# Patient Record
Sex: Female | Born: 1935 | Race: Black or African American | Hispanic: No | Smoking: Former smoker
Health system: Southern US, Community
[De-identification: ages and names within clinical notes are randomized; demographics above are authoritative.]

## PROBLEM LIST (undated history)

## (undated) DIAGNOSIS — M1990 Unspecified osteoarthritis, unspecified site: Secondary | ICD-10-CM

## (undated) DIAGNOSIS — Z7189 Other specified counseling: Secondary | ICD-10-CM

## (undated) DIAGNOSIS — E039 Hypothyroidism, unspecified: Secondary | ICD-10-CM

## (undated) DIAGNOSIS — E049 Nontoxic goiter, unspecified: Secondary | ICD-10-CM

## (undated) DIAGNOSIS — C189 Malignant neoplasm of colon, unspecified: Secondary | ICD-10-CM

## (undated) DIAGNOSIS — Z933 Colostomy status: Secondary | ICD-10-CM

## (undated) DIAGNOSIS — I10 Essential (primary) hypertension: Secondary | ICD-10-CM

## (undated) DIAGNOSIS — E065 Other chronic thyroiditis: Secondary | ICD-10-CM

## (undated) HISTORY — PX: COLONOSCOPY: SHX174

## (undated) HISTORY — PX: COLOSTOMY REVERSAL: SHX5782

## (undated) HISTORY — DX: Other specified counseling: Z71.89

## (undated) HISTORY — PX: PORTA CATH INSERTION: CATH118285

---

## 2016-07-28 HISTORY — PX: COLON SURGERY: SHX602

## 2016-07-28 HISTORY — PX: NEPHRECTOMY: SHX65

## 2016-07-28 HISTORY — PX: SPLENECTOMY: SUR1306

## 2016-09-23 ENCOUNTER — Other Ambulatory Visit: Payer: Self-pay | Admitting: Oncology

## 2016-09-26 DIAGNOSIS — C186 Malignant neoplasm of descending colon: Secondary | ICD-10-CM | POA: Insufficient documentation

## 2016-09-27 ENCOUNTER — Ambulatory Visit: Payer: Federal, State, Local not specified - PPO | Admitting: Hematology

## 2016-09-30 ENCOUNTER — Ambulatory Visit (HOSPITAL_BASED_OUTPATIENT_CLINIC_OR_DEPARTMENT_OTHER): Payer: Medicare Other | Admitting: Hematology & Oncology

## 2016-09-30 ENCOUNTER — Encounter: Payer: Self-pay | Admitting: Hematology & Oncology

## 2016-09-30 ENCOUNTER — Other Ambulatory Visit (HOSPITAL_BASED_OUTPATIENT_CLINIC_OR_DEPARTMENT_OTHER): Payer: Medicare Other

## 2016-09-30 ENCOUNTER — Ambulatory Visit: Payer: Medicare Other

## 2016-09-30 VITALS — BP 173/87 | HR 96 | Temp 98.2°F | Resp 16 | Wt 155.1 lb

## 2016-09-30 DIAGNOSIS — D500 Iron deficiency anemia secondary to blood loss (chronic): Secondary | ICD-10-CM

## 2016-09-30 DIAGNOSIS — C186 Malignant neoplasm of descending colon: Secondary | ICD-10-CM

## 2016-09-30 DIAGNOSIS — C187 Malignant neoplasm of sigmoid colon: Secondary | ICD-10-CM

## 2016-09-30 DIAGNOSIS — Z7189 Other specified counseling: Secondary | ICD-10-CM

## 2016-09-30 HISTORY — DX: Other specified counseling: Z71.89

## 2016-09-30 LAB — COMPREHENSIVE METABOLIC PANEL
ALT: 11 U/L (ref 0–55)
AST: 27 U/L (ref 5–34)
Albumin: 3.6 g/dL (ref 3.5–5.0)
Alkaline Phosphatase: 147 U/L (ref 40–150)
Anion Gap: 8 mEq/L (ref 3–11)
BUN: 21.5 mg/dL (ref 7.0–26.0)
CO2: 18 mEq/L — ABNORMAL LOW (ref 22–29)
Calcium: 10.4 mg/dL (ref 8.4–10.4)
Chloride: 111 mEq/L — ABNORMAL HIGH (ref 98–109)
Creatinine: 1.8 mg/dL — ABNORMAL HIGH (ref 0.6–1.1)
EGFR: 31 mL/min/{1.73_m2} — ABNORMAL LOW (ref >90)
Glucose: 95 mg/dl (ref 70–140)
Potassium: 4.7 mEq/L (ref 3.5–5.1)
Sodium: 137 mEq/L (ref 136–145)
Total Bilirubin: 0.34 mg/dL (ref 0.20–1.20)
Total Protein: 8.3 g/dL (ref 6.4–8.3)

## 2016-09-30 LAB — CBC WITH DIFFERENTIAL (CANCER CENTER ONLY)
BASO#: 0.1 10*3/uL (ref 0.0–0.2)
BASO%: 0.5 % (ref 0.0–2.0)
EOS%: 2.7 % (ref 0.0–7.0)
Eosinophils Absolute: 0.3 10*3/uL (ref 0.0–0.5)
HCT: 36 % (ref 34.8–46.6)
HGB: 11.7 g/dL (ref 11.6–15.9)
LYMPH#: 1.2 10*3/uL (ref 0.9–3.3)
LYMPH%: 10.8 % — ABNORMAL LOW (ref 14.0–48.0)
MCH: 28.3 pg (ref 26.0–34.0)
MCHC: 32.5 g/dL (ref 32.0–36.0)
MCV: 87 fL (ref 81–101)
MONO#: 1.2 10*3/uL — ABNORMAL HIGH (ref 0.1–0.9)
MONO%: 10.6 % (ref 0.0–13.0)
NEUT#: 8.3 10*3/uL — ABNORMAL HIGH (ref 1.5–6.5)
NEUT%: 75.4 % (ref 39.6–80.0)
Platelets: 400 10*3/uL (ref 145–400)
RBC: 4.13 10*6/uL (ref 3.70–5.32)
RDW: 19.8 % — ABNORMAL HIGH (ref 11.1–15.7)
WBC: 11 10*3/uL — ABNORMAL HIGH (ref 3.9–10.0)

## 2016-09-30 LAB — IRON AND TIBC
%SAT: 10 % — ABNORMAL LOW (ref 21–57)
Iron: 29 ug/dL — ABNORMAL LOW (ref 41–142)
TIBC: 293 ug/dL (ref 236–444)
UIBC: 263 ug/dL (ref 120–384)

## 2016-09-30 LAB — CEA (IN HOUSE-CHCC): CEA (CHCC-In House): 4.78 ng/mL (ref 0.00–5.00)

## 2016-09-30 LAB — LACTATE DEHYDROGENASE: LDH: 163 U/L (ref 125–245)

## 2016-09-30 LAB — FERRITIN: Ferritin: 132 ng/ml (ref 9–269)

## 2016-09-30 NOTE — Progress Notes (Signed)
Referral MD  Reason for Referral: Stage IIIb ([id number]) adenocarcinoma of the sigmoid colon  Chief Complaint  Patient presents with  . New Patient (Initial Visit)  : I had colon cancer that was removed.  HPI: Melanie [name] is a very charming 82 year old African-American female. She has been in good health. She lives in [place].  She had been having issues with anemia. She is found to be deficient. She had a little bit of weight loss. She had not noted any melena or bright red blood per rectum.  She was seen by gastroenterology in [facility]. She never had a colonoscopy. On February 1, she underwent a colonoscopy. Not surprisingly, this showed a mass in the sigmoid colon. This was biopsied. The pathology report showed an ulcerative moderately differentiated adenocarcinoma.  She was then referred to [facility]. She was seen by general surgery. She had had a CT scan. This was done on March 13. The CT scan did not show any evidence of metastatic disease. She had thickening in the proximal descending colon over a period of 6 cm. Lung bases were clear.  She then underwent a partial colectomy. This was done on March 29.  Of note, I do not see that a pre-operative CEA was performed. I am sure that it was performed given the thoroughness of her doctors in [place].  She has a temporary colostomy. The pathology report ([id number]) showed an invasive adenocarcinoma. This was moderately differentiated. This is in the descending colon. It measured 6.5 cm. It was adherent to the left kidney, omentum, distal pancreas, and spleen. She had this all resected. The tumor was not invading into any organs. There was lymphovascular invasion. She had 1/25 lymph nodes positive.  She was tested for microsatellite instability. This was normal.   She was staged as IIIB (T3N1M0) adenocarcinoma of the descending colon.   Her sister lives in town. It was felt that it would be easiest for her to be treated locally  in [place]. She time he was referred to the [facility].  There is a history of colon cancer. Apparently her mother had colon cancer and she was in her 33s.  She does not smoke. She used to smoke. 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  She is doing better. She feels pretty well. She was in the hospital for 10 days.  She's had no fever. She's had no cough. She's had no nausea or vomiting.  Overall, I would say performance status is ECOG 1.    No past medical history on file.:  No past surgical history on file.:   Current Outpatient Prescriptions:  .  aspirin EC 81 MG tablet, Take 81 mg by mouth daily., Disp: , Rfl:  .  calcium carbonate (OSCAL) 1500 (600 Ca) MG TABS tablet, Take 600 mg by mouth daily., Disp: , Rfl:  .  Multiple Vitamin (MULTIVITAMIN) tablet, Take 1 tablet by mouth daily., Disp: , Rfl:  .  naproxen (NAPROSYN) 250 MG tablet, Take 250 mg by mouth., Disp: , Rfl:  .  valACYclovir (VALTREX) 1000 MG tablet, Take 1,000 mg by mouth., Disp: , Rfl:  .  amLODipine (NORVASC) 10 MG tablet, Take 10 mg by mouth daily., Disp: , Rfl:  .  ferrous sulfate 325 (65 FE) MG tablet, Take 325 mg by mouth daily., Disp: , Rfl:  .  levothyroxine (SYNTHROID, LEVOTHROID) 25 MCG tablet, Take 25 mcg by mouth daily., Disp: , Rfl:  .  metoprolol tartrate (LOPRESSOR)  25 MG tablet, Take 25 mg by mouth daily., Disp: , Rfl:  .  ondansetron (ZOFRAN) 4 MG tablet, Take 4 mg by mouth as needed., Disp: , Rfl:  .  oxyCODONE (OXY IR/ROXICODONE) 5 MG immediate release tablet, Take 5 mg by mouth as needed., Disp: , Rfl: :  :  Not on File:  No family history on file.:  Social History   Social History  . Marital status: Unknown    Spouse name: N/A  . Number of children: N/A  . Years of education: N/A   Occupational History  . Not on file.   Social History Main Topics  . Smoking status: Not on file  . Smokeless tobacco: Not on file  . Alcohol use Not on file  . Drug  use: Unknown  . Sexual activity: Not on file   Other Topics Concern  . Not on file   Social History Narrative  . No narrative on file  :  Pertinent items are noted in HPI.  Exam:Well-developed and well-nourished African-American female in no obvious distress. Vital signs are temperature of 98.2. Pulse 96. Blood pressure 173/87. Weight is 155 pounds. Head and neck exam shows no ocular or oral lesions. There are no palpable cervical or supraclavicular lymph nodes. Lungs are clear bilaterally. Cardiac exam regular rate and rhythm with no murmurs, rubs or bruits. Abdomen is soft. She has the laparoscopy scars. She has the temporary colostomy centered in the lower abdomen. She has good bowel sounds. There is no guarding or rebound tenderness. There is no obvious abdominal mass. There is no palpable liver or spleen tip. Back exam shows no tenderness over the spine, ribs or hips. Extremities shows no clubbing, cyanosis or edema. Skin exam shows no rashes, ecchymoses or petechia.      Recent Labs  09/30/16 0940  WBC 11.0*  HGB 11.7  HCT 36.0  PLT 400    Recent Labs  09/30/16 0940  NA 137  K 4.7  CO2 18*  GLUCOSE 95  BUN 21.5  CREATININE 1.8*  CALCIUM 10.4    Blood smear review:  As above   Pathology: As above     Assessment and Plan:  Melanie [name] is a very charming 82 year old African-American female. She has been in very good shape. She now has stage IIIB adenocarcinoma of the descending colon. She has one positive lymph node.  Despite her age, I think that she is in good enough shape that she could handle adjuvant chemotherapy.  I spoke to she and her sister for about an hour. I explained to them why I thought that adjuvant chemotherapy would be useful. I think that without adjuvant chemotherapy, her risk of recurrence would be close to 50%. I think with adjuvant chemotherapy, the risk of recurrence would be less than 20%.  I think she would tolerate FOLFOX. I think the  real question is whether or not she needs 3 months or 6 months of FOLFOX. I know some recent studies have seemed to suggest that for "low risk" colon cancer in the adjuvant setting, 3 months is as effective as 6 months without the added toxicity. Hopefully, this might be what I would consider.  She will need to have a Port-A-Cath put in. She understands this.  I discussed goals of care. Clearly, goals of care are sure. I think that she has a good performance status. I would not want to see metastatic colon cancer determine her prognosis. As such, this is why I feel adjuvant  therapy is indicated.  We will see about getting treatment started. Ultimately we will get started within 2 weeks. She is at the six-week mark such that we have to really get started.  I answered all their questions. I gave her a prayer blanket. She was very thankful.

## 2016-09-30 NOTE — Progress Notes (Signed)
START ON PATHWAY REGIMEN - Colorectal     A cycle is every 14 days:     Oxaliplatin      Leucovorin      5-Fluorouracil      5-Fluorouracil   **Always confirm dose/schedule in your pharmacy ordering system**  Patient Characteristics: Colon Adjuvant, Stage III, Low Risk (T1-3, N1) Current evidence of distant metastases<= No AJCC T Category: T3 AJCC N Category: N1a AJCC M Category: M0 AJCC 8 Stage Grouping: IIIB Intent of Therapy: Curative Intent, Discussed with Patient 

## 2016-10-03 ENCOUNTER — Telehealth: Payer: Self-pay

## 2016-10-05 ENCOUNTER — Other Ambulatory Visit: Payer: Self-pay | Admitting: Radiology

## 2016-10-05 ENCOUNTER — Encounter: Payer: Self-pay | Admitting: *Deleted

## 2016-10-06 ENCOUNTER — Other Ambulatory Visit: Payer: Federal, State, Local not specified - PPO

## 2016-10-07 ENCOUNTER — Other Ambulatory Visit (HOSPITAL_COMMUNITY): Payer: Federal, State, Local not specified - PPO

## 2016-10-07 ENCOUNTER — Ambulatory Visit (HOSPITAL_COMMUNITY): Admission: RE | Admit: 2016-10-07 | Payer: Medicare Other | Source: Ambulatory Visit

## 2016-10-10 ENCOUNTER — Other Ambulatory Visit: Payer: Self-pay | Admitting: *Deleted

## 2016-10-10 ENCOUNTER — Ambulatory Visit: Payer: Medicare Other

## 2016-10-10 DIAGNOSIS — C186 Malignant neoplasm of descending colon: Secondary | ICD-10-CM

## 2016-10-10 MED ORDER — LIDOCAINE-PRILOCAINE 2.5-2.5 % EX CREA: 1.0000 "application " | TOPICAL_CREAM | CUTANEOUS | 0 refills | Status: DC | PRN

## 2016-10-10 MED ORDER — LIDOCAINE-PRILOCAINE 2.5-2.5 % EX CREA: 1.0000 "application " | TOPICAL_CREAM | CUTANEOUS | 2 refills | Status: DC | PRN

## 2016-10-11 ENCOUNTER — Other Ambulatory Visit (HOSPITAL_BASED_OUTPATIENT_CLINIC_OR_DEPARTMENT_OTHER): Payer: Medicare Other

## 2016-10-11 ENCOUNTER — Ambulatory Visit: Payer: Medicare Other

## 2016-10-11 ENCOUNTER — Ambulatory Visit (HOSPITAL_BASED_OUTPATIENT_CLINIC_OR_DEPARTMENT_OTHER): Payer: Medicare Other

## 2016-10-11 ENCOUNTER — Other Ambulatory Visit: Payer: Self-pay | Admitting: *Deleted

## 2016-10-11 VITALS — Ht 64.0 in

## 2016-10-11 VITALS — BP 129/64 | HR 58 | Temp 98.3°F | Resp 17

## 2016-10-11 DIAGNOSIS — C187 Malignant neoplasm of sigmoid colon: Secondary | ICD-10-CM | POA: Diagnosis present

## 2016-10-11 DIAGNOSIS — Z5111 Encounter for antineoplastic chemotherapy: Secondary | ICD-10-CM | POA: Diagnosis present

## 2016-10-11 DIAGNOSIS — C186 Malignant neoplasm of descending colon: Secondary | ICD-10-CM

## 2016-10-11 LAB — CBC WITH DIFFERENTIAL (CANCER CENTER ONLY)
BASO#: 0.1 10*3/uL (ref 0.0–0.2)
BASO%: 0.6 % (ref 0.0–2.0)
EOS%: 4 % (ref 0.0–7.0)
Eosinophils Absolute: 0.4 10*3/uL (ref 0.0–0.5)
HCT: 33.5 % — ABNORMAL LOW (ref 34.8–46.6)
HGB: 11 g/dL — ABNORMAL LOW (ref 11.6–15.9)
LYMPH#: 1.3 10*3/uL (ref 0.9–3.3)
LYMPH%: 13.5 % — ABNORMAL LOW (ref 14.0–48.0)
MCH: 28.6 pg (ref 26.0–34.0)
MCHC: 32.8 g/dL (ref 32.0–36.0)
MCV: 87 fL (ref 81–101)
MONO#: 1.2 10*3/uL — ABNORMAL HIGH (ref 0.1–0.9)
MONO%: 12.1 % (ref 0.0–13.0)
NEUT#: 7 10*3/uL — ABNORMAL HIGH (ref 1.5–6.5)
NEUT%: 69.8 % (ref 39.6–80.0)
Platelets: 410 10*3/uL — ABNORMAL HIGH (ref 145–400)
RBC: 3.85 10*6/uL (ref 3.70–5.32)
RDW: 19.6 % — ABNORMAL HIGH (ref 11.1–15.7)
WBC: 10 10*3/uL (ref 3.9–10.0)

## 2016-10-11 LAB — CMP (CANCER CENTER ONLY)
ALT(SGPT): 15 U/L (ref 10–47)
AST: 29 U/L (ref 11–38)
Albumin: 3.3 g/dL (ref 3.3–5.5)
Alkaline Phosphatase: 126 U/L — ABNORMAL HIGH (ref 26–84)
BUN, Bld: 19 mg/dL (ref 7–22)
CO2: 24 mEq/L (ref 18–33)
Calcium: 9.5 mg/dL (ref 8.0–10.3)
Chloride: 108 mEq/L (ref 98–108)
Creat: 1.6 mg/dl — ABNORMAL HIGH (ref 0.6–1.2)
Glucose, Bld: 96 mg/dL (ref 73–118)
Potassium: 4.2 mEq/L (ref 3.3–4.7)
Sodium: 140 mEq/L (ref 128–145)
Total Bilirubin: 0.5 mg/dl (ref 0.20–1.60)
Total Protein: 7.4 g/dL (ref 6.4–8.1)

## 2016-10-11 MED ORDER — LORAZEPAM 0.5 MG PO TABS: 0.5000 mg | ORAL_TABLET | Freq: Four times a day (QID) | ORAL | 0 refills | Status: DC | PRN

## 2016-10-11 MED ORDER — PALONOSETRON HCL INJECTION 0.25 MG/5ML
0.2500 mg | Freq: Once | INTRAVENOUS | Status: AC
  Administered 2016-10-11: 0.25 mg via INTRAVENOUS

## 2016-10-11 MED ORDER — FLUOROURACIL CHEMO INJECTION 2.5 GM/50ML
400.0000 mg/m2 | Freq: Once | INTRAVENOUS | Status: AC
  Administered 2016-10-11: 650 mg via INTRAVENOUS
  Filled 2016-10-11: qty 13

## 2016-10-11 MED ORDER — DEXAMETHASONE 4 MG PO TABS: ORAL_TABLET | ORAL | 1 refills | Status: DC

## 2016-10-11 MED ORDER — ONDANSETRON HCL 8 MG PO TABS: 8.0000 mg | ORAL_TABLET | Freq: Two times a day (BID) | ORAL | 0 refills | Status: DC | PRN

## 2016-10-11 MED ORDER — SODIUM CHLORIDE 0.9% FLUSH
10.0000 mL | INTRAVENOUS | Status: DC | PRN
  Filled 2016-10-11: qty 10

## 2016-10-11 MED ORDER — OXALIPLATIN CHEMO INJECTION 100 MG/20ML
64.0000 mg/m2 | Freq: Once | INTRAVENOUS | Status: AC
  Administered 2016-10-11: 100 mg via INTRAVENOUS
  Filled 2016-10-11: qty 20

## 2016-10-11 MED ORDER — DEXAMETHASONE SODIUM PHOSPHATE 10 MG/ML IJ SOLN
INTRAMUSCULAR | Status: AC
  Filled 2016-10-11: qty 1

## 2016-10-11 MED ORDER — HEPARIN SOD (PORK) LOCK FLUSH 100 UNIT/ML IV SOLN
500.0000 [IU] | Freq: Once | INTRAVENOUS | Status: DC | PRN
  Filled 2016-10-11: qty 5

## 2016-10-11 MED ORDER — DEXTROSE 5 % IV SOLN
Freq: Once | INTRAVENOUS | Status: AC
  Administered 2016-10-11: 12:00:00 via INTRAVENOUS

## 2016-10-11 MED ORDER — PROCHLORPERAZINE MALEATE 10 MG PO TABS: 10.0000 mg | ORAL_TABLET | Freq: Four times a day (QID) | ORAL | 1 refills | Status: DC | PRN

## 2016-10-11 MED ORDER — DEXAMETHASONE SODIUM PHOSPHATE 10 MG/ML IJ SOLN
10.0000 mg | Freq: Once | INTRAMUSCULAR | Status: AC
  Administered 2016-10-11: 10 mg via INTRAVENOUS

## 2016-10-11 MED ORDER — PALONOSETRON HCL INJECTION 0.25 MG/5ML
INTRAVENOUS | Status: AC
  Filled 2016-10-11: qty 5

## 2016-10-11 MED ORDER — LEUCOVORIN CALCIUM INJECTION 350 MG
400.0000 mg/m2 | Freq: Once | INTRAVENOUS | Status: AC
  Administered 2016-10-11: 636 mg via INTRAVENOUS
  Filled 2016-10-11: qty 31.8

## 2016-10-11 MED ORDER — SODIUM CHLORIDE 0.9 % IV SOLN
1920.0000 mg/m2 | INTRAVENOUS | Status: DC
  Administered 2016-10-11: 3400 mg via INTRAVENOUS
  Filled 2016-10-11: qty 68

## 2016-10-11 NOTE — Patient Instructions (Signed)
[facility] Discharge Instructions for Patients Receiving Chemotherapy  Today you received the following chemotherapy agents Oxaliplatin, Leucovorin, 5FU  To help prevent nausea and vomiting after your treatment, we encourage you to take your nausea medication as prescribed by MD. **DO NOT TAKE ZOFRAN FOR 3 DAYS. YOU MAY TAKE ZOFRAN ON FRIDAY**   If you develop nausea and vomiting that is not controlled by your nausea medication, call the clinic.   BELOW ARE SYMPTOMS THAT SHOULD BE REPORTED IMMEDIATELY:  *FEVER GREATER THAN 100.5 F  *CHILLS WITH OR WITHOUT FEVER  NAUSEA AND VOMITING THAT IS NOT CONTROLLED WITH YOUR NAUSEA MEDICATION  *UNUSUAL SHORTNESS OF BREATH  *UNUSUAL BRUISING OR BLEEDING  TENDERNESS IN MOUTH AND THROAT WITH OR WITHOUT PRESENCE OF ULCERS  *URINARY PROBLEMS  *BOWEL PROBLEMS  UNUSUAL RASH Items with * indicate a potential emergency and should be followed up as soon as possible.  Feel free to call the clinic you have any questions or concerns. The clinic phone number is [phone].  Please show the [facility] at check-in to the Emergency Department and triage nurse.  Fluorouracil, 5-FU injection What is this medicine? FLUOROURACIL, 5-FU (flure oh YOOR a sil) is a chemotherapy drug. It slows the growth of cancer cells. This medicine is used to treat many types of cancer like breast cancer, colon or rectal cancer, pancreatic cancer, and stomach cancer. This medicine may be used for other purposes; ask your health care provider or pharmacist if you have questions. COMMON BRAND NAME(S): Adrucil What should I tell my health care provider before I take this medicine? They need to know if you have any of these conditions: -blood disorders -dihydropyrimidine dehydrogenase (DPD) deficiency -infection (especially a virus infection such as chickenpox, cold sores, or herpes) -kidney disease -liver disease -malnourished, poor  nutrition -recent or ongoing radiation therapy -an unusual or allergic reaction to fluorouracil, other chemotherapy, other medicines, foods, dyes, or preservatives -pregnant or trying to get pregnant -breast-feeding How should I use this medicine? This drug is given as an infusion or injection into a vein. It is administered in a hospital or clinic by a specially trained health care professional. Talk to your pediatrician regarding the use of this medicine in children. Special care may be needed. Overdosage: If you think you have taken too much of this medicine contact a poison control center or emergency room at once. NOTE: This medicine is only for you. Do not share this medicine with others. What if I miss a dose? It is important not to miss your dose. Call your doctor or health care professional if you are unable to keep an appointment. What may interact with this medicine? -allopurinol -cimetidine -dapsone -digoxin -hydroxyurea -leucovorin -levamisole -medicines for seizures like ethotoin, fosphenytoin, phenytoin -medicines to increase blood counts like filgrastim, pegfilgrastim, sargramostim -medicines that treat or prevent blood clots like warfarin, enoxaparin, and dalteparin -methotrexate -metronidazole -pyrimethamine -some other chemotherapy drugs like busulfan, cisplatin, estramustine, vinblastine -trimethoprim -trimetrexate -vaccines Talk to your doctor or health care professional before taking any of these medicines: -acetaminophen -aspirin -ibuprofen -ketoprofen -naproxen This list may not describe all possible interactions. Give your health care provider a list of all the medicines, herbs, non-prescription drugs, or dietary supplements you use. Also tell them if you smoke, drink alcohol, or use illegal drugs. Some items may interact with your medicine. What should I watch for while using this medicine? Visit your doctor for checks on your progress. This drug may  make you feel  generally unwell. This is not uncommon, as chemotherapy can affect healthy cells as well as cancer cells. Report any side effects. Continue your course of treatment even though you feel ill unless your doctor tells you to stop. In some cases, you may be given additional medicines to help with side effects. Follow all directions for their use. Call your doctor or health care professional for advice if you get a fever, chills or sore throat, or other symptoms of a cold or flu. Do not treat yourself. This drug decreases your body's ability to fight infections. Try to avoid being around people who are sick. This medicine may increase your risk to bruise or bleed. Call your doctor or health care professional if you notice any unusual bleeding. Be careful brushing and flossing your teeth or using a toothpick because you may get an infection or bleed more easily. If you have any dental work done, tell your dentist you are receiving this medicine. Avoid taking products that contain aspirin, acetaminophen, ibuprofen, naproxen, or ketoprofen unless instructed by your doctor. These medicines may hide a fever. Do not become pregnant while taking this medicine. Women should inform their doctor if they wish to become pregnant or think they might be pregnant. There is a potential for serious side effects to an unborn child. Talk to your health care professional or pharmacist for more information. Do not breast-feed an infant while taking this medicine. Men should inform their doctor if they wish to father a child. This medicine may lower sperm counts. Do not treat diarrhea with over the counter products. Contact your doctor if you have diarrhea that lasts more than 2 days or if it is severe and watery. This medicine can make you more sensitive to the sun. Keep out of the sun. If you cannot avoid being in the sun, wear protective clothing and use sunscreen. Do not use sun lamps or tanning beds/booths. What  side effects may I notice from receiving this medicine? Side effects that you should report to your doctor or health care professional as soon as possible: -allergic reactions like skin rash, itching or hives, swelling of the face, lips, or tongue -low blood counts - this medicine may decrease the number of white blood cells, red blood cells and platelets. You may be at increased risk for infections and bleeding. -signs of infection - fever or chills, cough, sore throat, pain or difficulty passing urine -signs of decreased platelets or bleeding - bruising, pinpoint red spots on the skin, black, tarry stools, blood in the urine -signs of decreased red blood cells - unusually weak or tired, fainting spells, lightheadedness -breathing problems -changes in vision -chest pain -mouth sores -nausea and vomiting -pain, swelling, redness at site where injected -pain, tingling, numbness in the hands or feet -redness, swelling, or sores on hands or feet -stomach pain -unusual bleeding Side effects that usually do not require medical attention (report to your doctor or health care professional if they continue or are bothersome): -changes in finger or toe nails -diarrhea -dry or itchy skin -hair loss -headache -loss of appetite -sensitivity of eyes to the light -stomach upset -unusually teary eyes This list may not describe all possible side effects. Call your doctor for medical advice about side effects. You may report side effects to FDA at 1-800-FDA-1088. Where should I keep my medicine? This drug is given in a hospital or clinic and will not be stored at home. NOTE: This sheet is a summary. It may not cover all  possible information. If you have questions about this medicine, talk to your doctor, pharmacist, or health care provider.  2018 Elsevier/Gold Standard (2007-09-19 13:53:16)   Leucovorin injection What is this medicine? LEUCOVORIN (loo koe VOR in) is used to prevent or treat the  harmful effects of some medicines. This medicine is used to treat anemia caused by a low amount of folic acid in the body. It is also used with 5-fluorouracil (5-FU) to treat colon cancer. This medicine may be used for other purposes; ask your health care provider or pharmacist if you have questions. What should I tell my health care provider before I take this medicine? They need to know if you have any of these conditions: -anemia from low levels of vitamin B-12 in the blood -an unusual or allergic reaction to leucovorin, folic acid, other medicines, foods, dyes, or preservatives -pregnant or trying to get pregnant -breast-feeding How should I use this medicine? This medicine is for injection into a muscle or into a vein. It is given by a health care professional in a hospital or clinic setting. Talk to your pediatrician regarding the use of this medicine in children. Special care may be needed. Overdosage: If you think you have taken too much of this medicine contact a poison control center or emergency room at once. NOTE: This medicine is only for you. Do not share this medicine with others. What if I miss a dose? This does not apply. What may interact with this medicine? -capecitabine -fluorouracil -phenobarbital -phenytoin -primidone -trimethoprim-sulfamethoxazole This list may not describe all possible interactions. Give your health care provider a list of all the medicines, herbs, non-prescription drugs, or dietary supplements you use. Also tell them if you smoke, drink alcohol, or use illegal drugs. Some items may interact with your medicine. What should I watch for while using this medicine? Your condition will be monitored carefully while you are receiving this medicine. This medicine may increase the side effects of 5-fluorouracil, 5-FU. Tell your doctor or health care professional if you have diarrhea or mouth sores that do not get better or that get worse. What side effects  may I notice from receiving this medicine? Side effects that you should report to your doctor or health care professional as soon as possible: -allergic reactions like skin rash, itching or hives, swelling of the face, lips, or tongue -breathing problems -fever, infection -mouth sores -unusual bleeding or bruising -unusually weak or tired Side effects that usually do not require medical attention (report to your doctor or health care professional if they continue or are bothersome): -constipation or diarrhea -loss of appetite -nausea, vomiting This list may not describe all possible side effects. Call your doctor for medical advice about side effects. You may report side effects to FDA at 1-800-FDA-1088. Where should I keep my medicine? This drug is given in a hospital or clinic and will not be stored at home. NOTE: This sheet is a summary. It may not cover all possible information. If you have questions about this medicine, talk to your doctor, pharmacist, or health care provider.  2018 Elsevier/Gold Standard (2007-11-20 16:50:29)   Oxaliplatin Injection What is this medicine? OXALIPLATIN (ox AL i PLA tin) is a chemotherapy drug. It targets fast dividing cells, like cancer cells, and causes these cells to die. This medicine is used to treat cancers of the colon and rectum, and many other cancers. This medicine may be used for other purposes; ask your health care provider or pharmacist if you have questions.  COMMON BRAND NAME(S): Eloxatin What should I tell my health care provider before I take this medicine? They need to know if you have any of these conditions: -kidney disease -an unusual or allergic reaction to oxaliplatin, other chemotherapy, other medicines, foods, dyes, or preservatives -pregnant or trying to get pregnant -breast-feeding How should I use this medicine? This drug is given as an infusion into a vein. It is administered in a hospital or clinic by a specially  trained health care professional. Talk to your pediatrician regarding the use of this medicine in children. Special care may be needed. Overdosage: If you think you have taken too much of this medicine contact a poison control center or emergency room at once. NOTE: This medicine is only for you. Do not share this medicine with others. What if I miss a dose? It is important not to miss a dose. Call your doctor or health care professional if you are unable to keep an appointment. What may interact with this medicine? -medicines to increase blood counts like filgrastim, pegfilgrastim, sargramostim -probenecid -some antibiotics like amikacin, gentamicin, neomycin, polymyxin B, streptomycin, tobramycin -zalcitabine Talk to your doctor or health care professional before taking any of these medicines: -acetaminophen -aspirin -ibuprofen -ketoprofen -naproxen This list may not describe all possible interactions. Give your health care provider a list of all the medicines, herbs, non-prescription drugs, or dietary supplements you use. Also tell them if you smoke, drink alcohol, or use illegal drugs. Some items may interact with your medicine. What should I watch for while using this medicine? Your condition will be monitored carefully while you are receiving this medicine. You will need important blood work done while you are taking this medicine. This medicine can make you more sensitive to cold. Do not drink cold drinks or use ice. Cover exposed skin before coming in contact with cold temperatures or cold objects. When out in cold weather wear warm clothing and cover your mouth and nose to warm the air that goes into your lungs. Tell your doctor if you get sensitive to the cold. This drug may make you feel generally unwell. This is not uncommon, as chemotherapy can affect healthy cells as well as cancer cells. Report any side effects. Continue your course of treatment even though you feel ill unless  your doctor tells you to stop. In some cases, you may be given additional medicines to help with side effects. Follow all directions for their use. Call your doctor or health care professional for advice if you get a fever, chills or sore throat, or other symptoms of a cold or flu. Do not treat yourself. This drug decreases your body's ability to fight infections. Try to avoid being around people who are sick. This medicine may increase your risk to bruise or bleed. Call your doctor or health care professional if you notice any unusual bleeding. Be careful brushing and flossing your teeth or using a toothpick because you may get an infection or bleed more easily. If you have any dental work done, tell your dentist you are receiving this medicine. Avoid taking products that contain aspirin, acetaminophen, ibuprofen, naproxen, or ketoprofen unless instructed by your doctor. These medicines may hide a fever. Do not become pregnant while taking this medicine. Women should inform their doctor if they wish to become pregnant or think they might be pregnant. There is a potential for serious side effects to an unborn child. Talk to your health care professional or pharmacist for more information. Do not breast-feed  an infant while taking this medicine. Call your doctor or health care professional if you get diarrhea. Do not treat yourself. What side effects may I notice from receiving this medicine? Side effects that you should report to your doctor or health care professional as soon as possible: -allergic reactions like skin rash, itching or hives, swelling of the face, lips, or tongue -low blood counts - This drug may decrease the number of white blood cells, red blood cells and platelets. You may be at increased risk for infections and bleeding. -signs of infection - fever or chills, cough, sore throat, pain or difficulty passing urine -signs of decreased platelets or bleeding - bruising, pinpoint red spots  on the skin, black, tarry stools, nosebleeds -signs of decreased red blood cells - unusually weak or tired, fainting spells, lightheadedness -breathing problems -chest pain, pressure -cough -diarrhea -jaw tightness -mouth sores -nausea and vomiting -pain, swelling, redness or irritation at the injection site -pain, tingling, numbness in the hands or feet -problems with balance, talking, walking -redness, blistering, peeling or loosening of the skin, including inside the mouth -trouble passing urine or change in the amount of urine Side effects that usually do not require medical attention (report to your doctor or health care professional if they continue or are bothersome): -changes in vision -constipation -hair loss -loss of appetite -metallic taste in the mouth or changes in taste -stomach pain This list may not describe all possible side effects. Call your doctor for medical advice about side effects. You may report side effects to FDA at 1-800-FDA-1088. Where should I keep my medicine? This drug is given in a hospital or clinic and will not be stored at home. NOTE: This sheet is a summary. It may not cover all possible information. If you have questions about this medicine, talk to your doctor, pharmacist, or health care provider.  2018 Elsevier/Gold Standard (2007-12-11 17:22:47)

## 2016-10-11 NOTE — Patient Instructions (Signed)
Implanted Port Home Guide An implanted port is a type of central line that is placed under the skin. Central lines are used to provide IV access when treatment or nutrition needs to be given through a person's veins. Implanted ports are used for long-term IV access. An implanted port may be placed because:  You need IV medicine that would be irritating to the small veins in your hands or arms.  You need long-term IV medicines, such as antibiotics.  You need IV nutrition for a long period.  You need frequent blood draws for lab tests.  You need dialysis.  Implanted ports are usually placed in the chest area, but they can also be placed in the upper arm, the abdomen, or the leg. An implanted port has two main parts:  Reservoir. The reservoir is round and will appear as a small, raised area under your skin. The reservoir is the part where a needle is inserted to give medicines or draw blood.  Catheter. The catheter is a thin, flexible tube that extends from the reservoir. The catheter is placed into a large vein. Medicine that is inserted into the reservoir goes into the catheter and then into the vein.  How will I care for my incision site? Do not get the incision site wet. Bathe or shower as directed by your health care provider. How is my port accessed? Special steps must be taken to access the port:  Before the port is accessed, a numbing cream can be placed on the skin. This helps numb the skin over the port site.  Your health care provider uses a sterile technique to access the port. ? Your health care provider must put on a mask and sterile gloves. ? The skin over your port is cleaned carefully with an antiseptic and allowed to dry. ? The port is gently pinched between sterile gloves, and a needle is inserted into the port.  Only "non-coring" port needles should be used to access the port. Once the port is accessed, a blood return should be checked. This helps ensure that the port  is in the vein and is not clogged.  If your port needs to remain accessed for a constant infusion, a clear (transparent) bandage will be placed over the needle site. The bandage and needle will need to be changed every week, or as directed by your health care provider.  Keep the bandage covering the needle clean and dry. Do not get it wet. Follow your health care provider's instructions on how to take a shower or bath while the port is accessed.  If your port does not need to stay accessed, no bandage is needed over the port.  What is flushing? Flushing helps keep the port from getting clogged. Follow your health care provider's instructions on how and when to flush the port. Ports are usually flushed with saline solution or a medicine called heparin. The need for flushing will depend on how the port is used.  If the port is used for intermittent medicines or blood draws, the port will need to be flushed: ? After medicines have been given. ? After blood has been drawn. ? As part of routine maintenance.  If a constant infusion is running, the port may not need to be flushed.  How long will my port stay implanted? The port can stay in for as long as your health care provider thinks it is needed. When it is time for the port to come out, surgery will be   done to remove it. The procedure is similar to the one performed when the port was put in. When should I seek immediate medical care? When you have an implanted port, you should seek immediate medical care if:  You notice a bad smell coming from the incision site.  You have swelling, redness, or drainage at the incision site.  You have more swelling or pain at the port site or the surrounding area.  You have a fever that is not controlled with medicine.  This information is not intended to replace advice given to you by your health care provider. Make sure you discuss any questions you have with your health care provider. Document  Released: 05/16/2005 Document Revised: 10/22/2015 Document Reviewed: 01/21/2013 Elsevier Interactive Patient Education  2017 [employer]  

## 2016-10-12 ENCOUNTER — Telehealth: Payer: Self-pay

## 2016-10-12 NOTE — Telephone Encounter (Signed)
Attempted to call patient for 1st time chemotherapy follow up call; voicemail did not identify pt. No message left.

## 2016-10-13 ENCOUNTER — Telehealth: Payer: Self-pay | Admitting: *Deleted

## 2016-10-13 ENCOUNTER — Ambulatory Visit (HOSPITAL_BASED_OUTPATIENT_CLINIC_OR_DEPARTMENT_OTHER): Payer: Medicare Other

## 2016-10-13 VITALS — BP 121/61 | HR 54 | Temp 97.9°F | Resp 18

## 2016-10-13 DIAGNOSIS — C186 Malignant neoplasm of descending colon: Secondary | ICD-10-CM | POA: Diagnosis present

## 2016-10-13 MED ORDER — HEPARIN SOD (PORK) LOCK FLUSH 100 UNIT/ML IV SOLN
500.0000 [IU] | Freq: Once | INTRAVENOUS | Status: DC | PRN
  Filled 2016-10-13: qty 5

## 2016-10-13 MED ORDER — SODIUM CHLORIDE 0.9% FLUSH
10.0000 mL | INTRAVENOUS | Status: DC | PRN
  Filled 2016-10-13: qty 10

## 2016-10-13 NOTE — Telephone Encounter (Signed)
1st chemo follow up. Patient states she is eating and drinking well. Denies n/v, diarrhea.  vss. 66fu pump dc'd. Patient tolerated well.

## 2016-10-26 ENCOUNTER — Other Ambulatory Visit (HOSPITAL_BASED_OUTPATIENT_CLINIC_OR_DEPARTMENT_OTHER): Payer: Medicare Other

## 2016-10-26 ENCOUNTER — Ambulatory Visit (HOSPITAL_BASED_OUTPATIENT_CLINIC_OR_DEPARTMENT_OTHER): Payer: Medicare Other | Admitting: Family

## 2016-10-26 ENCOUNTER — Ambulatory Visit (HOSPITAL_BASED_OUTPATIENT_CLINIC_OR_DEPARTMENT_OTHER): Payer: Medicare Other

## 2016-10-26 ENCOUNTER — Ambulatory Visit: Payer: Medicare Other

## 2016-10-26 VITALS — Wt 150.5 lb

## 2016-10-26 DIAGNOSIS — R530 Neoplastic (malignant) related fatigue: Secondary | ICD-10-CM

## 2016-10-26 DIAGNOSIS — R634 Abnormal weight loss: Secondary | ICD-10-CM | POA: Diagnosis not present

## 2016-10-26 DIAGNOSIS — D500 Iron deficiency anemia secondary to blood loss (chronic): Secondary | ICD-10-CM | POA: Diagnosis not present

## 2016-10-26 DIAGNOSIS — C186 Malignant neoplasm of descending colon: Secondary | ICD-10-CM | POA: Diagnosis present

## 2016-10-26 DIAGNOSIS — Z5111 Encounter for antineoplastic chemotherapy: Secondary | ICD-10-CM | POA: Diagnosis present

## 2016-10-26 LAB — CMP (CANCER CENTER ONLY)
ALT(SGPT): 21 U/L (ref 10–47)
AST: 34 U/L (ref 11–38)
Albumin: 3.3 g/dL (ref 3.3–5.5)
Alkaline Phosphatase: 131 U/L — ABNORMAL HIGH (ref 26–84)
BUN, Bld: 20 mg/dL (ref 7–22)
CO2: 17 mEq/L — ABNORMAL LOW (ref 18–33)
Calcium: 9.2 mg/dL (ref 8.0–10.3)
Chloride: 109 mEq/L — ABNORMAL HIGH (ref 98–108)
Creat: 1.4 mg/dl — ABNORMAL HIGH (ref 0.6–1.2)
Glucose, Bld: 99 mg/dL (ref 73–118)
Potassium: 4.4 mEq/L (ref 3.3–4.7)
Sodium: 134 mEq/L (ref 128–145)
Total Bilirubin: 0.6 mg/dl (ref 0.20–1.60)
Total Protein: 7.1 g/dL (ref 6.4–8.1)

## 2016-10-26 LAB — CBC WITH DIFFERENTIAL (CANCER CENTER ONLY)
BASO#: 0.1 10*3/uL (ref 0.0–0.2)
BASO%: 0.8 % (ref 0.0–2.0)
EOS%: 1.8 % (ref 0.0–7.0)
Eosinophils Absolute: 0.2 10*3/uL (ref 0.0–0.5)
HCT: 34.6 % — ABNORMAL LOW (ref 34.8–46.6)
HGB: 11.6 g/dL (ref 11.6–15.9)
LYMPH#: 1.2 10*3/uL (ref 0.9–3.3)
LYMPH%: 11.7 % — ABNORMAL LOW (ref 14.0–48.0)
MCH: 28.9 pg (ref 26.0–34.0)
MCHC: 33.5 g/dL (ref 32.0–36.0)
MCV: 86 fL (ref 81–101)
MONO#: 1.5 10*3/uL — ABNORMAL HIGH (ref 0.1–0.9)
MONO%: 15.2 % — ABNORMAL HIGH (ref 0.0–13.0)
NEUT#: 6.9 10*3/uL — ABNORMAL HIGH (ref 1.5–6.5)
NEUT%: 70.5 % (ref 39.6–80.0)
Platelets: 313 10*3/uL (ref 145–400)
RBC: 4.01 10*6/uL (ref 3.70–5.32)
RDW: 18.7 % — ABNORMAL HIGH (ref 11.1–15.7)
WBC: 9.8 10*3/uL (ref 3.9–10.0)

## 2016-10-26 LAB — IRON AND TIBC
%SAT: 11 % — ABNORMAL LOW (ref 21–57)
Iron: 33 ug/dL — ABNORMAL LOW (ref 41–142)
TIBC: 309 ug/dL (ref 236–444)
UIBC: 276 ug/dL (ref 120–384)

## 2016-10-26 LAB — FERRITIN: Ferritin: 172 ng/ml (ref 9–269)

## 2016-10-26 LAB — CEA (IN HOUSE-CHCC): CEA (CHCC-In House): 8.07 ng/mL — ABNORMAL HIGH (ref 0.00–5.00)

## 2016-10-26 MED ORDER — FLUOROURACIL CHEMO INJECTION 2.5 GM/50ML
400.0000 mg/m2 | Freq: Once | INTRAVENOUS | Status: AC
  Administered 2016-10-26: 700 mg via INTRAVENOUS
  Filled 2016-10-26: qty 14

## 2016-10-26 MED ORDER — PALONOSETRON HCL INJECTION 0.25 MG/5ML
INTRAVENOUS | Status: AC
  Filled 2016-10-26: qty 5

## 2016-10-26 MED ORDER — DEXAMETHASONE SODIUM PHOSPHATE 10 MG/ML IJ SOLN
10.0000 mg | Freq: Once | INTRAMUSCULAR | Status: AC
  Administered 2016-10-26: 10 mg via INTRAVENOUS

## 2016-10-26 MED ORDER — OXALIPLATIN CHEMO INJECTION 100 MG/20ML
55.0000 mg/m2 | Freq: Once | INTRAVENOUS | Status: AC
  Administered 2016-10-26: 100 mg via INTRAVENOUS
  Filled 2016-10-26: qty 20

## 2016-10-26 MED ORDER — LEUCOVORIN CALCIUM INJECTION 350 MG
393.0000 mg/m2 | Freq: Once | INTRAMUSCULAR | Status: AC
  Administered 2016-10-26: 700 mg via INTRAVENOUS
  Filled 2016-10-26: qty 35

## 2016-10-26 MED ORDER — SODIUM CHLORIDE 0.9 % IV SOLN
1920.0000 mg/m2 | INTRAVENOUS | Status: DC
  Administered 2016-10-26: 3400 mg via INTRAVENOUS
  Filled 2016-10-26: qty 68

## 2016-10-26 MED ORDER — DEXTROSE 5 % IV SOLN
Freq: Once | INTRAVENOUS | Status: AC
  Administered 2016-10-26: 11:00:00 via INTRAVENOUS

## 2016-10-26 MED ORDER — DEXAMETHASONE SODIUM PHOSPHATE 10 MG/ML IJ SOLN
INTRAMUSCULAR | Status: AC
  Filled 2016-10-26: qty 1

## 2016-10-26 MED ORDER — PALONOSETRON HCL INJECTION 0.25 MG/5ML
0.2500 mg | Freq: Once | INTRAVENOUS | Status: AC
  Administered 2016-10-26: 0.25 mg via INTRAVENOUS

## 2016-10-26 NOTE — Progress Notes (Signed)
Hematology and Oncology Follow Up Visit  Melanie [name] [id number] 1935/03/26 82 y.o. 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 She did well with her first cycle and her only complaints at this time are occasional fatigue and loss of appetite.  She is making herself eat and is staying well hydrated. Her weight is sown 5 lbs since her last visit. We discussed her adding Boost of Ensure between meals a couple times a day and she will try this.  No fever, chills, n/v, cough, rash, dizziness, SOB, chest pain, palpitations, abdominal pain or changes in bowel or bladder habits. Her ostomy has had good output.  No episodes of bleeding, bruising or petechiae. No lymphadenopathy found on exam.  No swelling, tenderness, numbness or tingling in her extremities. No c/o pain at this time.   ECOG Performance Status: 1 - Symptomatic but completely ambulatory  Medications:  Allergies as of 10/26/2016   Not on File     Medication List       Accurate as of 10/26/16  9:46 AM. Always use your most recent med list.          amLODipine 10 MG tablet Commonly known as:  NORVASC Take 10 mg by mouth daily.   aspirin EC 81 MG tablet Take 81 mg by mouth daily.   calcium carbonate 1500 (600 Ca) MG Tabs tablet Commonly known as:  OSCAL Take 600 mg by mouth daily.   dexamethasone 4 MG tablet Commonly known as:  DECADRON Take 2 tablets by mouth daily. Start day after chemotherapy for 2 days.   ferrous sulfate 325 (65 FE) MG tablet Take 325 mg by mouth daily.   levothyroxine 25 MCG tablet Commonly known as:  SYNTHROID, LEVOTHROID Take 25 mcg by mouth daily.   lidocaine-prilocaine cream Commonly known as:  EMLA Apply 1 application topically as needed.   lidocaine-prilocaine  cream Commonly known as:  EMLA Apply 1 application topically as needed.   LORazepam 0.5 MG tablet Commonly known as:  ATIVAN Take 1 tablet (0.5 mg total) by mouth every 6 (six) hours as needed (Nausea/Vomitting).   metoprolol tartrate 25 MG tablet Commonly known as:  LOPRESSOR Take 25 mg by mouth daily.   multivitamin tablet Take 1 tablet by mouth daily.   naproxen 250 MG tablet Commonly known as:  NAPROSYN Take 250 mg by mouth.   ondansetron 8 MG tablet Commonly known as:  ZOFRAN Take 1 tablet (8 mg total) by mouth 2 (two) times daily as needed for nausea or vomiting. Start 3 days after chemo.   oxyCODONE 5 MG immediate release tablet Commonly known as:  Oxy IR/ROXICODONE Take 5 mg by mouth as needed.   prochlorperazine 10 MG tablet Commonly known as:  COMPAZINE Take 1 tablet (10 mg total) by mouth every 6 (six) hours as needed for nausea or vomiting.   VALTREX 1000 MG tablet Generic drug:  valACYclovir Take 1,000 mg by mouth.       Allergies: Not on File  Past Medical History, Surgical history, Social history, and Family History were reviewed and updated.  Review of Systems: All other 10 point review of systems is negative.   Physical Exam:  weight is 150 lb 8 oz (68.3 kg).   Wt Readings from Last 3 Encounters:  10/26/16 150 lb 8 oz (68.3 kg)  09/30/16 155 lb 1.9 oz (70.4 kg)    Ocular: Sclerae unicteric, pupils equal, round and reactive to light Ear-nose-throat: Oropharynx clear, dentition fair Lymphatic: No cervical, supraclavicular or axillary adenopathy Lungs no rales or rhonchi, good excursion bilaterally Heart regular rate and rhythm, no murmur appreciated Abd soft, nontender, positive bowel sounds, no liver or spleen tip palpated on exam, no fluid wave MSK no focal spinal tenderness, no joint edema Neuro: non-focal, well-oriented, appropriate affect Breasts: Deferred   Lab Results  Component Value Date   WBC 9.8 10/26/2016   HGB 11.6  10/26/2016   HCT 34.6 (L) 10/26/2016   MCV 86 10/26/2016   PLT 313 10/26/2016   Lab Results  Component Value Date   FERRITIN 132 09/30/2016   IRON 29 (L) 09/30/2016   TIBC 293 09/30/2016   UIBC 263 09/30/2016   IRONPCTSAT 10 (L) 09/30/2016   Lab Results  Component Value Date   RBC 4.01 10/26/2016   No results found for: KPAFRELGTCHN, LAMBDASER, KAPLAMBRATIO No results found for: IGGSERUM, IGA, IGMSERUM No results found for: Odetta Pink, SPEI   Chemistry      Component Value Date/Time   NA 140 10/11/2016 0937   NA 137 09/30/2016 0940   K 4.2 10/11/2016 0937   K 4.7 09/30/2016 0940   CL 108 10/11/2016 0937   CO2 24 10/11/2016 0937   CO2 18 (L) 09/30/2016 0940   BUN 19 10/11/2016 0937   BUN 21.5 09/30/2016 0940   CREATININE 1.6 (H) 10/11/2016 0937   CREATININE 1.8 (H) 09/30/2016 0940      Component Value Date/Time   CALCIUM 9.5 10/11/2016 0937   CALCIUM 10.4 09/30/2016 0940   ALKPHOS 126 (H) 10/11/2016 0937   ALKPHOS 147 09/30/2016 0940   AST 29 10/11/2016 0937   AST 27 09/30/2016 0940   ALT 15 10/11/2016 0937   ALT 11 09/30/2016 0940   BILITOT 0.50 10/11/2016 0937   BILITOT 0.34 09/30/2016 0940      Impression and Plan: Melanie [name] is a verry pleasant 82 yo African American female with stage IIIB adenocarcinoma of the descending colon, 1/25 lymph nodes positive. Sh tolerated her first cycle of FOLFOX nicely and has only had some mild fatigue and loss of appetite. With the 5 lb weight loss we will have her try adding in Boost or Ensure daily.  We will proceed with cycle 2 today as planned per Dr Marin Olp.  She states that she will be following with surgery soon to discuss her eventual ostomy reversal.  She has her current treatment and appointment schedule.  Both she and her family know to contact our office with any questions or concerns. We can certainly see her sooner if need be.   Eliezer Bottom,  NP 5/30/20189:46 AM

## 2016-10-26 NOTE — Patient Instructions (Signed)
[facility] Discharge Instructions for Patients Receiving Chemotherapy  Today you received the following chemotherapy agents Oxaliplatin, Leucovorin, 5FU  To help prevent nausea and vomiting after your treatment, we encourage you to take your nausea medication as prescribed by MD. **DO NOT TAKE ZOFRAN FOR 3 DAYS. YOU MAY TAKE ZOFRAN ON FRIDAY**   If you develop nausea and vomiting that is not controlled by your nausea medication, call the clinic.   BELOW ARE SYMPTOMS THAT SHOULD BE REPORTED IMMEDIATELY:  *FEVER GREATER THAN 100.5 F  *CHILLS WITH OR WITHOUT FEVER  NAUSEA AND VOMITING THAT IS NOT CONTROLLED WITH YOUR NAUSEA MEDICATION  *UNUSUAL SHORTNESS OF BREATH  *UNUSUAL BRUISING OR BLEEDING  TENDERNESS IN MOUTH AND THROAT WITH OR WITHOUT PRESENCE OF ULCERS  *URINARY PROBLEMS  *BOWEL PROBLEMS  UNUSUAL RASH Items with * indicate a potential emergency and should be followed up as soon as possible.  Feel free to call the clinic you have any questions or concerns. The clinic phone number is [phone].  Please show the [facility] at check-in to the Emergency Department and triage nurse.  Fluorouracil, 5-FU injection What is this medicine? FLUOROURACIL, 5-FU (flure oh YOOR a sil) is a chemotherapy drug. It slows the growth of cancer cells. This medicine is used to treat many types of cancer like breast cancer, colon or rectal cancer, pancreatic cancer, and stomach cancer. This medicine may be used for other purposes; ask your health care provider or pharmacist if you have questions. COMMON BRAND NAME(S): Adrucil What should I tell my health care provider before I take this medicine? They need to know if you have any of these conditions: -blood disorders -dihydropyrimidine dehydrogenase (DPD) deficiency -infection (especially a virus infection such as chickenpox, cold sores, or herpes) -kidney disease -liver disease -malnourished, poor  nutrition -recent or ongoing radiation therapy -an unusual or allergic reaction to fluorouracil, other chemotherapy, other medicines, foods, dyes, or preservatives -pregnant or trying to get pregnant -breast-feeding How should I use this medicine? This drug is given as an infusion or injection into a vein. It is administered in a hospital or clinic by a specially trained health care professional. Talk to your pediatrician regarding the use of this medicine in children. Special care may be needed. Overdosage: If you think you have taken too much of this medicine contact a poison control center or emergency room at once. NOTE: This medicine is only for you. Do not share this medicine with others. What if I miss a dose? It is important not to miss your dose. Call your doctor or health care professional if you are unable to keep an appointment. What may interact with this medicine? -allopurinol -cimetidine -dapsone -digoxin -hydroxyurea -leucovorin -levamisole -medicines for seizures like ethotoin, fosphenytoin, phenytoin -medicines to increase blood counts like filgrastim, pegfilgrastim, sargramostim -medicines that treat or prevent blood clots like warfarin, enoxaparin, and dalteparin -methotrexate -metronidazole -pyrimethamine -some other chemotherapy drugs like busulfan, cisplatin, estramustine, vinblastine -trimethoprim -trimetrexate -vaccines Talk to your doctor or health care professional before taking any of these medicines: -acetaminophen -aspirin -ibuprofen -ketoprofen -naproxen This list may not describe all possible interactions. Give your health care provider a list of all the medicines, herbs, non-prescription drugs, or dietary supplements you use. Also tell them if you smoke, drink alcohol, or use illegal drugs. Some items may interact with your medicine. What should I watch for while using this medicine? Visit your doctor for checks on your progress. This drug may  make you feel  generally unwell. This is not uncommon, as chemotherapy can affect healthy cells as well as cancer cells. Report any side effects. Continue your course of treatment even though you feel ill unless your doctor tells you to stop. In some cases, you may be given additional medicines to help with side effects. Follow all directions for their use. Call your doctor or health care professional for advice if you get a fever, chills or sore throat, or other symptoms of a cold or flu. Do not treat yourself. This drug decreases your body's ability to fight infections. Try to avoid being around people who are sick. This medicine may increase your risk to bruise or bleed. Call your doctor or health care professional if you notice any unusual bleeding. Be careful brushing and flossing your teeth or using a toothpick because you may get an infection or bleed more easily. If you have any dental work done, tell your dentist you are receiving this medicine. Avoid taking products that contain aspirin, acetaminophen, ibuprofen, naproxen, or ketoprofen unless instructed by your doctor. These medicines may hide a fever. Do not become pregnant while taking this medicine. Women should inform their doctor if they wish to become pregnant or think they might be pregnant. There is a potential for serious side effects to an unborn child. Talk to your health care professional or pharmacist for more information. Do not breast-feed an infant while taking this medicine. Men should inform their doctor if they wish to father a child. This medicine may lower sperm counts. Do not treat diarrhea with over the counter products. Contact your doctor if you have diarrhea that lasts more than 2 days or if it is severe and watery. This medicine can make you more sensitive to the sun. Keep out of the sun. If you cannot avoid being in the sun, wear protective clothing and use sunscreen. Do not use sun lamps or tanning beds/booths. What  side effects may I notice from receiving this medicine? Side effects that you should report to your doctor or health care professional as soon as possible: -allergic reactions like skin rash, itching or hives, swelling of the face, lips, or tongue -low blood counts - this medicine may decrease the number of white blood cells, red blood cells and platelets. You may be at increased risk for infections and bleeding. -signs of infection - fever or chills, cough, sore throat, pain or difficulty passing urine -signs of decreased platelets or bleeding - bruising, pinpoint red spots on the skin, black, tarry stools, blood in the urine -signs of decreased red blood cells - unusually weak or tired, fainting spells, lightheadedness -breathing problems -changes in vision -chest pain -mouth sores -nausea and vomiting -pain, swelling, redness at site where injected -pain, tingling, numbness in the hands or feet -redness, swelling, or sores on hands or feet -stomach pain -unusual bleeding Side effects that usually do not require medical attention (report to your doctor or health care professional if they continue or are bothersome): -changes in finger or toe nails -diarrhea -dry or itchy skin -hair loss -headache -loss of appetite -sensitivity of eyes to the light -stomach upset -unusually teary eyes This list may not describe all possible side effects. Call your doctor for medical advice about side effects. You may report side effects to FDA at 1-800-FDA-1088. Where should I keep my medicine? This drug is given in a hospital or clinic and will not be stored at home. NOTE: This sheet is a summary. It may not cover all  possible information. If you have questions about this medicine, talk to your doctor, pharmacist, or health care provider.  2018 Elsevier/Gold Standard (2007-09-19 13:53:16)   Leucovorin injection What is this medicine? LEUCOVORIN (loo koe VOR in) is used to prevent or treat the  harmful effects of some medicines. This medicine is used to treat anemia caused by a low amount of folic acid in the body. It is also used with 5-fluorouracil (5-FU) to treat colon cancer. This medicine may be used for other purposes; ask your health care provider or pharmacist if you have questions. What should I tell my health care provider before I take this medicine? They need to know if you have any of these conditions: -anemia from low levels of vitamin B-12 in the blood -an unusual or allergic reaction to leucovorin, folic acid, other medicines, foods, dyes, or preservatives -pregnant or trying to get pregnant -breast-feeding How should I use this medicine? This medicine is for injection into a muscle or into a vein. It is given by a health care professional in a hospital or clinic setting. Talk to your pediatrician regarding the use of this medicine in children. Special care may be needed. Overdosage: If you think you have taken too much of this medicine contact a poison control center or emergency room at once. NOTE: This medicine is only for you. Do not share this medicine with others. What if I miss a dose? This does not apply. What may interact with this medicine? -capecitabine -fluorouracil -phenobarbital -phenytoin -primidone -trimethoprim-sulfamethoxazole This list may not describe all possible interactions. Give your health care provider a list of all the medicines, herbs, non-prescription drugs, or dietary supplements you use. Also tell them if you smoke, drink alcohol, or use illegal drugs. Some items may interact with your medicine. What should I watch for while using this medicine? Your condition will be monitored carefully while you are receiving this medicine. This medicine may increase the side effects of 5-fluorouracil, 5-FU. Tell your doctor or health care professional if you have diarrhea or mouth sores that do not get better or that get worse. What side effects  may I notice from receiving this medicine? Side effects that you should report to your doctor or health care professional as soon as possible: -allergic reactions like skin rash, itching or hives, swelling of the face, lips, or tongue -breathing problems -fever, infection -mouth sores -unusual bleeding or bruising -unusually weak or tired Side effects that usually do not require medical attention (report to your doctor or health care professional if they continue or are bothersome): -constipation or diarrhea -loss of appetite -nausea, vomiting This list may not describe all possible side effects. Call your doctor for medical advice about side effects. You may report side effects to FDA at 1-800-FDA-1088. Where should I keep my medicine? This drug is given in a hospital or clinic and will not be stored at home. NOTE: This sheet is a summary. It may not cover all possible information. If you have questions about this medicine, talk to your doctor, pharmacist, or health care provider.  2018 Elsevier/Gold Standard (2007-11-20 16:50:29)   Oxaliplatin Injection What is this medicine? OXALIPLATIN (ox AL i PLA tin) is a chemotherapy drug. It targets fast dividing cells, like cancer cells, and causes these cells to die. This medicine is used to treat cancers of the colon and rectum, and many other cancers. This medicine may be used for other purposes; ask your health care provider or pharmacist if you have questions.  COMMON BRAND NAME(S): Eloxatin What should I tell my health care provider before I take this medicine? They need to know if you have any of these conditions: -kidney disease -an unusual or allergic reaction to oxaliplatin, other chemotherapy, other medicines, foods, dyes, or preservatives -pregnant or trying to get pregnant -breast-feeding How should I use this medicine? This drug is given as an infusion into a vein. It is administered in a hospital or clinic by a specially  trained health care professional. Talk to your pediatrician regarding the use of this medicine in children. Special care may be needed. Overdosage: If you think you have taken too much of this medicine contact a poison control center or emergency room at once. NOTE: This medicine is only for you. Do not share this medicine with others. What if I miss a dose? It is important not to miss a dose. Call your doctor or health care professional if you are unable to keep an appointment. What may interact with this medicine? -medicines to increase blood counts like filgrastim, pegfilgrastim, sargramostim -probenecid -some antibiotics like amikacin, gentamicin, neomycin, polymyxin B, streptomycin, tobramycin -zalcitabine Talk to your doctor or health care professional before taking any of these medicines: -acetaminophen -aspirin -ibuprofen -ketoprofen -naproxen This list may not describe all possible interactions. Give your health care provider a list of all the medicines, herbs, non-prescription drugs, or dietary supplements you use. Also tell them if you smoke, drink alcohol, or use illegal drugs. Some items may interact with your medicine. What should I watch for while using this medicine? Your condition will be monitored carefully while you are receiving this medicine. You will need important blood work done while you are taking this medicine. This medicine can make you more sensitive to cold. Do not drink cold drinks or use ice. Cover exposed skin before coming in contact with cold temperatures or cold objects. When out in cold weather wear warm clothing and cover your mouth and nose to warm the air that goes into your lungs. Tell your doctor if you get sensitive to the cold. This drug may make you feel generally unwell. This is not uncommon, as chemotherapy can affect healthy cells as well as cancer cells. Report any side effects. Continue your course of treatment even though you feel ill unless  your doctor tells you to stop. In some cases, you may be given additional medicines to help with side effects. Follow all directions for their use. Call your doctor or health care professional for advice if you get a fever, chills or sore throat, or other symptoms of a cold or flu. Do not treat yourself. This drug decreases your body's ability to fight infections. Try to avoid being around people who are sick. This medicine may increase your risk to bruise or bleed. Call your doctor or health care professional if you notice any unusual bleeding. Be careful brushing and flossing your teeth or using a toothpick because you may get an infection or bleed more easily. If you have any dental work done, tell your dentist you are receiving this medicine. Avoid taking products that contain aspirin, acetaminophen, ibuprofen, naproxen, or ketoprofen unless instructed by your doctor. These medicines may hide a fever. Do not become pregnant while taking this medicine. Women should inform their doctor if they wish to become pregnant or think they might be pregnant. There is a potential for serious side effects to an unborn child. Talk to your health care professional or pharmacist for more information. Do not breast-feed  an infant while taking this medicine. Call your doctor or health care professional if you get diarrhea. Do not treat yourself. What side effects may I notice from receiving this medicine? Side effects that you should report to your doctor or health care professional as soon as possible: -allergic reactions like skin rash, itching or hives, swelling of the face, lips, or tongue -low blood counts - This drug may decrease the number of white blood cells, red blood cells and platelets. You may be at increased risk for infections and bleeding. -signs of infection - fever or chills, cough, sore throat, pain or difficulty passing urine -signs of decreased platelets or bleeding - bruising, pinpoint red spots  on the skin, black, tarry stools, nosebleeds -signs of decreased red blood cells - unusually weak or tired, fainting spells, lightheadedness -breathing problems -chest pain, pressure -cough -diarrhea -jaw tightness -mouth sores -nausea and vomiting -pain, swelling, redness or irritation at the injection site -pain, tingling, numbness in the hands or feet -problems with balance, talking, walking -redness, blistering, peeling or loosening of the skin, including inside the mouth -trouble passing urine or change in the amount of urine Side effects that usually do not require medical attention (report to your doctor or health care professional if they continue or are bothersome): -changes in vision -constipation -hair loss -loss of appetite -metallic taste in the mouth or changes in taste -stomach pain This list may not describe all possible side effects. Call your doctor for medical advice about side effects. You may report side effects to FDA at 1-800-FDA-1088. Where should I keep my medicine? This drug is given in a hospital or clinic and will not be stored at home. NOTE: This sheet is a summary. It may not cover all possible information. If you have questions about this medicine, talk to your doctor, pharmacist, or health care provider.  2018 Elsevier/Gold Standard (2007-12-11 17:22:47)

## 2016-10-28 ENCOUNTER — Ambulatory Visit (HOSPITAL_BASED_OUTPATIENT_CLINIC_OR_DEPARTMENT_OTHER): Payer: Medicare Other

## 2016-10-28 DIAGNOSIS — C186 Malignant neoplasm of descending colon: Secondary | ICD-10-CM | POA: Diagnosis not present

## 2016-10-28 DIAGNOSIS — Z452 Encounter for adjustment and management of vascular access device: Secondary | ICD-10-CM

## 2016-10-28 MED ORDER — SODIUM CHLORIDE 0.9% FLUSH
10.0000 mL | INTRAVENOUS | Status: DC | PRN
  Administered 2016-10-28: 10 mL
  Filled 2016-10-28: qty 10

## 2016-10-28 MED ORDER — HEPARIN SOD (PORK) LOCK FLUSH 100 UNIT/ML IV SOLN
500.0000 [IU] | Freq: Once | INTRAVENOUS | Status: AC | PRN
  Administered 2016-10-28: 500 [IU]
  Filled 2016-10-28: qty 5

## 2016-11-08 ENCOUNTER — Ambulatory Visit (HOSPITAL_BASED_OUTPATIENT_CLINIC_OR_DEPARTMENT_OTHER): Payer: Medicare Other

## 2016-11-08 ENCOUNTER — Ambulatory Visit: Payer: Medicare Other

## 2016-11-08 ENCOUNTER — Ambulatory Visit (HOSPITAL_BASED_OUTPATIENT_CLINIC_OR_DEPARTMENT_OTHER): Payer: Medicare Other | Admitting: Hematology & Oncology

## 2016-11-08 ENCOUNTER — Other Ambulatory Visit (HOSPITAL_BASED_OUTPATIENT_CLINIC_OR_DEPARTMENT_OTHER): Payer: Medicare Other

## 2016-11-08 VITALS — BP 145/80 | HR 87 | Temp 98.0°F | Resp 18 | Wt 146.0 lb

## 2016-11-08 DIAGNOSIS — C186 Malignant neoplasm of descending colon: Secondary | ICD-10-CM | POA: Diagnosis present

## 2016-11-08 DIAGNOSIS — R530 Neoplastic (malignant) related fatigue: Secondary | ICD-10-CM

## 2016-11-08 DIAGNOSIS — Z5111 Encounter for antineoplastic chemotherapy: Secondary | ICD-10-CM

## 2016-11-08 DIAGNOSIS — D500 Iron deficiency anemia secondary to blood loss (chronic): Secondary | ICD-10-CM

## 2016-11-08 DIAGNOSIS — R634 Abnormal weight loss: Secondary | ICD-10-CM

## 2016-11-08 DIAGNOSIS — R64 Cachexia: Secondary | ICD-10-CM

## 2016-11-08 LAB — CBC WITH DIFFERENTIAL (CANCER CENTER ONLY)
BASO#: 0.1 10*3/uL (ref 0.0–0.2)
BASO%: 0.5 % (ref 0.0–2.0)
EOS%: 1.4 % (ref 0.0–7.0)
Eosinophils Absolute: 0.1 10*3/uL (ref 0.0–0.5)
HCT: 36.9 % (ref 34.8–46.6)
HGB: 12.5 g/dL (ref 11.6–15.9)
LYMPH#: 1.3 10*3/uL (ref 0.9–3.3)
LYMPH%: 13.4 % — ABNORMAL LOW (ref 14.0–48.0)
MCH: 29.4 pg (ref 26.0–34.0)
MCHC: 33.9 g/dL (ref 32.0–36.0)
MCV: 87 fL (ref 81–101)
MONO#: 1.8 10*3/uL — ABNORMAL HIGH (ref 0.1–0.9)
MONO%: 19.2 % — ABNORMAL HIGH (ref 0.0–13.0)
NEUT#: 6.2 10*3/uL (ref 1.5–6.5)
NEUT%: 65.5 % (ref 39.6–80.0)
Platelets: 225 10*3/uL (ref 145–400)
RBC: 4.25 10*6/uL (ref 3.70–5.32)
RDW: 20.1 % — ABNORMAL HIGH (ref 11.1–15.7)
WBC: 9.5 10*3/uL (ref 3.9–10.0)

## 2016-11-08 LAB — CEA (IN HOUSE-CHCC): CEA (CHCC-In House): 5.27 ng/mL — ABNORMAL HIGH (ref 0.00–5.00)

## 2016-11-08 LAB — CMP (CANCER CENTER ONLY)
ALT(SGPT): 24 U/L (ref 10–47)
AST: 46 U/L — ABNORMAL HIGH (ref 11–38)
Albumin: 3.5 g/dL (ref 3.3–5.5)
Alkaline Phosphatase: 121 U/L — ABNORMAL HIGH (ref 26–84)
BUN, Bld: 17 mg/dL (ref 7–22)
CO2: 18 mEq/L (ref 18–33)
Calcium: 9.2 mg/dL (ref 8.0–10.3)
Chloride: 106 mEq/L (ref 98–108)
Creat: 2 mg/dl — ABNORMAL HIGH (ref 0.6–1.2)
Glucose, Bld: 126 mg/dL — ABNORMAL HIGH (ref 73–118)
Potassium: 4.1 mEq/L (ref 3.3–4.7)
Sodium: 134 mEq/L (ref 128–145)
Total Bilirubin: 0.6 mg/dl (ref 0.20–1.60)
Total Protein: 7.2 g/dL (ref 6.4–8.1)

## 2016-11-08 MED ORDER — DEXAMETHASONE SODIUM PHOSPHATE 10 MG/ML IJ SOLN
10.0000 mg | Freq: Once | INTRAMUSCULAR | Status: AC
  Administered 2016-11-08: 10 mg via INTRAVENOUS

## 2016-11-08 MED ORDER — SODIUM CHLORIDE 0.9 % IV SOLN
1920.0000 mg/m2 | INTRAVENOUS | Status: DC
  Administered 2016-11-08: 3400 mg via INTRAVENOUS
  Filled 2016-11-08: qty 68

## 2016-11-08 MED ORDER — OXALIPLATIN CHEMO INJECTION 100 MG/20ML
57.0000 mg/m2 | Freq: Once | INTRAVENOUS | Status: AC
  Administered 2016-11-08: 100 mg via INTRAVENOUS
  Filled 2016-11-08: qty 20

## 2016-11-08 MED ORDER — SODIUM CHLORIDE 0.9% FLUSH
10.0000 mL | INTRAVENOUS | Status: DC | PRN
  Filled 2016-11-08: qty 10

## 2016-11-08 MED ORDER — DEXTROSE 5 % IV SOLN
Freq: Once | INTRAVENOUS | Status: AC
  Administered 2016-11-08: 09:00:00 via INTRAVENOUS

## 2016-11-08 MED ORDER — HEPARIN SOD (PORK) LOCK FLUSH 100 UNIT/ML IV SOLN
500.0000 [IU] | Freq: Once | INTRAVENOUS | Status: DC | PRN
  Filled 2016-11-08: qty 5

## 2016-11-08 MED ORDER — LEUCOVORIN CALCIUM INJECTION 350 MG
393.0000 mg/m2 | Freq: Once | INTRAVENOUS | Status: AC
  Administered 2016-11-08: 700 mg via INTRAVENOUS
  Filled 2016-11-08: qty 35

## 2016-11-08 MED ORDER — FLUOROURACIL CHEMO INJECTION 2.5 GM/50ML
400.0000 mg/m2 | Freq: Once | INTRAVENOUS | Status: AC
  Administered 2016-11-08: 700 mg via INTRAVENOUS
  Filled 2016-11-08: qty 14

## 2016-11-08 MED ORDER — DEXAMETHASONE SODIUM PHOSPHATE 10 MG/ML IJ SOLN
INTRAMUSCULAR | Status: AC
  Filled 2016-11-08: qty 1

## 2016-11-08 MED ORDER — PALONOSETRON HCL INJECTION 0.25 MG/5ML
0.2500 mg | Freq: Once | INTRAVENOUS | Status: AC
  Administered 2016-11-08: 0.25 mg via INTRAVENOUS

## 2016-11-08 MED ORDER — PALONOSETRON HCL INJECTION 0.25 MG/5ML
INTRAVENOUS | Status: AC
  Filled 2016-11-08: qty 5

## 2016-11-08 MED ORDER — MEGESTROL ACETATE 625 MG/5ML PO SUSP: 625.0000 mg | Freq: Every day | ORAL | 3 refills | Status: DC

## 2016-11-08 NOTE — Patient Instructions (Signed)
[facility] Discharge Instructions for Patients Receiving Chemotherapy  Today you received the following chemotherapy agents Oxaliplatin, 5FU  To help prevent nausea and vomiting after your treatment, we encourage you to take your nausea medication    If you develop nausea and vomiting that is not controlled by your nausea medication, call the clinic.   BELOW ARE SYMPTOMS THAT SHOULD BE REPORTED IMMEDIATELY:  *FEVER GREATER THAN 100.5 F  *CHILLS WITH OR WITHOUT FEVER  NAUSEA AND VOMITING THAT IS NOT CONTROLLED WITH YOUR NAUSEA MEDICATION  *UNUSUAL SHORTNESS OF BREATH  *UNUSUAL BRUISING OR BLEEDING  TENDERNESS IN MOUTH AND THROAT WITH OR WITHOUT PRESENCE OF ULCERS  *URINARY PROBLEMS  *BOWEL PROBLEMS  UNUSUAL RASH Items with * indicate a potential emergency and should be followed up as soon as possible.  Feel free to call the clinic you have any questions or concerns. The clinic phone number is [phone].  Please show the [facility] at check-in to the Emergency Department and triage nurse.

## 2016-11-08 NOTE — Progress Notes (Signed)
Ok to treat with labs.  Labs shown to Dr. Marin Olp.

## 2016-11-09 DIAGNOSIS — D500 Iron deficiency anemia secondary to blood loss (chronic): Secondary | ICD-10-CM | POA: Insufficient documentation

## 2016-11-09 NOTE — Progress Notes (Signed)
Hematology and Oncology Follow Up Visit  Melanie [name] [id number] July 25, 1934 82 y.o. 11/09/2016   Principle Diagnosis:  Stage IIIB adenocarcinoma of the descending colon, 1/25 lymph nodes positive  Current Therapy:   FOLFOX q 14 days s/p cycle #2   Interim History:  Melanie [name] is here today with Melanie [name] sister for follow-up and treatment.   She is doing okay. So far, she is tolerated treatment okay.  She has a ileostomy. She wants to have this reversed. I told she and Melanie [name] sister that we have to wait until after she finishes chemotherapy before the surgeon would reverse this.   She's had no problems with numbness or tingling in the hands or feet. She's had no cough. She's had no fever patient had no leg swelling.  There's been no mouth sores.   She's had no bleeding. There's been no bruising.  Overall, I say Melanie [name] performance status is ECOG 1.  Medications:  Allergies as of 11/08/2016   Not on File     Medication List       Accurate as of 11/08/16 11:59 PM. Always use your most recent med list.          amLODipine 10 MG tablet Commonly known as:  NORVASC Take 10 mg by mouth daily.   aspirin EC 81 MG tablet Take 81 mg by mouth daily.   calcium carbonate 1500 (600 Ca) MG Tabs tablet Commonly known as:  OSCAL Take 600 mg by mouth daily.   dexamethasone 4 MG tablet Commonly known as:  DECADRON Take 2 tablets by mouth daily. Start day after chemotherapy for 2 days.   ferrous sulfate 325 (65 FE) MG tablet Take 325 mg by mouth daily.   levothyroxine 25 MCG tablet Commonly known as:  SYNTHROID, LEVOTHROID Take 25 mcg by mouth daily.   lidocaine-prilocaine cream Commonly known as:  EMLA Apply 1 application topically as needed.   lidocaine-prilocaine cream Commonly known as:  EMLA Apply 1 application topically as needed.   LORazepam 0.5 MG tablet Commonly known as:  ATIVAN Take 1 tablet (0.5 mg total) by mouth every 6 (six) hours as needed (Nausea/Vomitting).     megestrol 625 MG/5ML suspension Commonly known as:  MEGACE ES Take 5 mLs (625 mg total) by mouth daily.   metoprolol tartrate 25 MG tablet Commonly known as:  LOPRESSOR Take 25 mg by mouth daily.   multivitamin tablet Take 1 tablet by mouth daily.   naproxen 250 MG tablet Commonly known as:  NAPROSYN Take 250 mg by mouth.   ondansetron 8 MG tablet Commonly known as:  ZOFRAN Take 1 tablet (8 mg total) by mouth 2 (two) times daily as needed for nausea or vomiting. Start 3 days after chemo.   oxyCODONE 5 MG immediate release tablet Commonly known as:  Oxy IR/ROXICODONE Take 5 mg by mouth as needed.   prochlorperazine 10 MG tablet Commonly known as:  COMPAZINE Take 1 tablet (10 mg total) by mouth every 6 (six) hours as needed for nausea or vomiting.   VALTREX 1000 MG tablet Generic drug:  valACYclovir Take 1,000 mg by mouth.       Allergies: Not on File  Past Medical History, Surgical history, Social history, and Family History were reviewed and updated.  Review of Systems: All other 10 point review of systems is negative.   Physical Exam:  weight is 146 lb (66.2 kg). Melanie [name] oral temperature is 98 F (36.7 C). Melanie [name] blood pressure is 145/80 (abnormal) and Melanie [name] pulse  is 87. Melanie [name] respiration is 18.   Wt Readings from Last 3 Encounters:  11/08/16 146 lb (66.2 kg)  10/26/16 150 lb 8 oz (68.3 kg)  09/30/16 155 lb 1.9 oz (70.4 kg)    Head and neck exam shows no ocular or oral lesions. There are no palpable cervical or supraclavicular lymph nodes. Lungs are clear bilaterally. Cardiac exam regular rate and rhythm with no murmurs, rubs or bruits. Abdomen is soft. She has the laparoscopy scars. She has the temporary colostomy centered in the lower abdomen. She has good bowel sounds. There is no guarding or rebound tenderness. There is no obvious abdominal mass. There is no palpable liver or spleen tip. Back exam shows no tenderness over the spine, ribs or hips. Extremities shows no  clubbing, cyanosis or edema. Skin exam shows no rashes, ecchymoses or petechia. Deferred   Lab Results  Component Value Date   WBC 9.5 11/08/2016   HGB 12.5 11/08/2016   HCT 36.9 11/08/2016   MCV 87 11/08/2016   PLT 225 11/08/2016   Lab Results  Component Value Date   FERRITIN 172 10/26/2016   IRON 33 (L) 10/26/2016   TIBC 309 10/26/2016   UIBC 276 10/26/2016   IRONPCTSAT 11 (L) 10/26/2016   Lab Results  Component Value Date   RBC 4.25 11/08/2016   No results found for: KPAFRELGTCHN, LAMBDASER, KAPLAMBRATIO No results found for: IGGSERUM, IGA, IGMSERUM No results found for: Odetta Pink, SPEI   Chemistry      Component Value Date/Time   NA 134 11/08/2016 0749   NA 137 09/30/2016 0940   K 4.1 11/08/2016 0749   K 4.7 09/30/2016 0940   CL 106 11/08/2016 0749   CO2 18 11/08/2016 0749   CO2 18 (L) 09/30/2016 0940   BUN 17 11/08/2016 0749   BUN 21.5 09/30/2016 0940   CREATININE 2.0 (H) 11/08/2016 0749   CREATININE 1.8 (H) 09/30/2016 0940      Component Value Date/Time   CALCIUM 9.2 11/08/2016 0749   CALCIUM 10.4 09/30/2016 0940   ALKPHOS 121 (H) 11/08/2016 0749   ALKPHOS 147 09/30/2016 0940   AST 46 (H) 11/08/2016 0749   AST 27 09/30/2016 0940   ALT 24 11/08/2016 0749   ALT 11 09/30/2016 0940   BILITOT 0.60 11/08/2016 0749   BILITOT 0.34 09/30/2016 0940      Impression and Plan: Melanie [name] is a verry pleasant 82 yo African American female with stage IIIB adenocarcinoma of the descending colon, 1/25 lymph nodes positive.   I think the request is having cycles of chemotherapy will she need. The recent studies seem to suggest that 3 months of therapy is as effective for earlier stage III colon cancer.  I think that I might consider Melanie [name] for a total of 8 cycles. I think this would be reasonable. She got started a little over 6 weeks from Melanie [name] initial surgery. As such, she might be at a higher risk for recurrence.    Back in late May, Melanie [name] CEA level was 8. We have to be careful with this. We will follow this up with Melanie [name] with Melanie [name] next treatment.   I'm glad to see that she responded to IV iron. Melanie [name] hemoglobin has improved nicely.   She is worried about losing weight. We will put Melanie [name] on some Megace. Hopefully this will help Melanie [name] gain some weight back.  We will see Melanie [name] back in 2 more weeks.    Clarita Mcelvain R,  MD 6/13/20186:34 PM

## 2016-11-10 ENCOUNTER — Ambulatory Visit (HOSPITAL_BASED_OUTPATIENT_CLINIC_OR_DEPARTMENT_OTHER): Payer: Medicare Other

## 2016-11-10 VITALS — BP 117/71 | HR 85 | Temp 98.3°F | Resp 16

## 2016-11-10 DIAGNOSIS — C186 Malignant neoplasm of descending colon: Secondary | ICD-10-CM | POA: Diagnosis not present

## 2016-11-10 DIAGNOSIS — Z452 Encounter for adjustment and management of vascular access device: Secondary | ICD-10-CM | POA: Diagnosis present

## 2016-11-10 MED ORDER — HEPARIN SOD (PORK) LOCK FLUSH 100 UNIT/ML IV SOLN
500.0000 [IU] | Freq: Once | INTRAVENOUS | Status: AC | PRN
  Administered 2016-11-10: 500 [IU]
  Filled 2016-11-10: qty 5

## 2016-11-10 MED ORDER — SODIUM CHLORIDE 0.9% FLUSH
10.0000 mL | INTRAVENOUS | Status: DC | PRN
  Administered 2016-11-10: 10 mL
  Filled 2016-11-10: qty 10

## 2016-11-16 ENCOUNTER — Telehealth: Payer: Self-pay | Admitting: *Deleted

## 2016-11-16 NOTE — Telephone Encounter (Signed)
Patient's family calling the office concerned that patient may be dehydrated. She isn't eating solids or liquids and is feeling weaker than usual. She denies nausea, vomiting or diarrhea. She just isn't interested in eating.  Will bring patient in tomorrow for assessment. [name] aware of appointment.

## 2016-11-17 ENCOUNTER — Ambulatory Visit (HOSPITAL_BASED_OUTPATIENT_CLINIC_OR_DEPARTMENT_OTHER): Payer: Medicare Other | Admitting: Family

## 2016-11-17 ENCOUNTER — Ambulatory Visit: Payer: Medicare Other

## 2016-11-17 ENCOUNTER — Ambulatory Visit (HOSPITAL_BASED_OUTPATIENT_CLINIC_OR_DEPARTMENT_OTHER): Payer: Medicare Other

## 2016-11-17 ENCOUNTER — Other Ambulatory Visit (HOSPITAL_BASED_OUTPATIENT_CLINIC_OR_DEPARTMENT_OTHER): Payer: Medicare Other

## 2016-11-17 ENCOUNTER — Ambulatory Visit (HOSPITAL_BASED_OUTPATIENT_CLINIC_OR_DEPARTMENT_OTHER)
Admission: RE | Admit: 2016-11-17 | Discharge: 2016-11-17 | Disposition: A | Discharge status: Home / Self Care | Payer: Medicare Other | Source: Ambulatory Visit | Attending: Family | Admitting: Family

## 2016-11-17 VITALS — BP 132/88 | HR 91 | Temp 97.5°F | Resp 16 | Wt 139.0 lb

## 2016-11-17 DIAGNOSIS — R634 Abnormal weight loss: Secondary | ICD-10-CM

## 2016-11-17 DIAGNOSIS — D500 Iron deficiency anemia secondary to blood loss (chronic): Secondary | ICD-10-CM

## 2016-11-17 DIAGNOSIS — R05 Cough: Secondary | ICD-10-CM | POA: Insufficient documentation

## 2016-11-17 DIAGNOSIS — R630 Anorexia: Secondary | ICD-10-CM

## 2016-11-17 DIAGNOSIS — R530 Neoplastic (malignant) related fatigue: Secondary | ICD-10-CM

## 2016-11-17 DIAGNOSIS — R5383 Other fatigue: Secondary | ICD-10-CM | POA: Insufficient documentation

## 2016-11-17 DIAGNOSIS — E860 Dehydration: Secondary | ICD-10-CM

## 2016-11-17 DIAGNOSIS — C186 Malignant neoplasm of descending colon: Secondary | ICD-10-CM

## 2016-11-17 DIAGNOSIS — Z9221 Personal history of antineoplastic chemotherapy: Secondary | ICD-10-CM | POA: Diagnosis not present

## 2016-11-17 DIAGNOSIS — R531 Weakness: Secondary | ICD-10-CM | POA: Insufficient documentation

## 2016-11-17 DIAGNOSIS — R64 Cachexia: Secondary | ICD-10-CM

## 2016-11-17 DIAGNOSIS — Z95828 Presence of other vascular implants and grafts: Secondary | ICD-10-CM

## 2016-11-17 DIAGNOSIS — R059 Cough, unspecified: Secondary | ICD-10-CM

## 2016-11-17 LAB — CBC WITH DIFFERENTIAL (CANCER CENTER ONLY)
BASO#: 0 10*3/uL (ref 0.0–0.2)
BASO%: 0.2 % (ref 0.0–2.0)
EOS%: 0.2 % (ref 0.0–7.0)
Eosinophils Absolute: 0 10*3/uL (ref 0.0–0.5)
HCT: 39.5 % (ref 34.8–46.6)
HGB: 13.9 g/dL (ref 11.6–15.9)
LYMPH#: 0.8 10*3/uL — ABNORMAL LOW (ref 0.9–3.3)
LYMPH%: 8.8 % — ABNORMAL LOW (ref 14.0–48.0)
MCH: 29.7 pg (ref 26.0–34.0)
MCHC: 35.2 g/dL (ref 32.0–36.0)
MCV: 84 fL (ref 81–101)
MONO#: 1.4 10*3/uL — ABNORMAL HIGH (ref 0.1–0.9)
MONO%: 15.1 % — ABNORMAL HIGH (ref 0.0–13.0)
NEUT#: 6.9 10*3/uL — ABNORMAL HIGH (ref 1.5–6.5)
NEUT%: 75.7 % (ref 39.6–80.0)
Platelets: 163 10*3/uL (ref 145–400)
RBC: 4.68 10*6/uL (ref 3.70–5.32)
RDW: 19.6 % — ABNORMAL HIGH (ref 11.1–15.7)
WBC: 9.1 10*3/uL (ref 3.9–10.0)

## 2016-11-17 LAB — FERRITIN: Ferritin: 517 ng/ml — ABNORMAL HIGH (ref 9–269)

## 2016-11-17 LAB — CMP (CANCER CENTER ONLY)
ALT(SGPT): 18 U/L (ref 10–47)
AST: 30 U/L (ref 11–38)
Albumin: 4 g/dL (ref 3.3–5.5)
Alkaline Phosphatase: 96 U/L — ABNORMAL HIGH (ref 26–84)
BUN, Bld: 35 mg/dL — ABNORMAL HIGH (ref 7–22)
CO2: 16 mEq/L — ABNORMAL LOW (ref 18–33)
Calcium: 9.7 mg/dL (ref 8.0–10.3)
Chloride: 106 mEq/L (ref 98–108)
Creat: 2 mg/dl — ABNORMAL HIGH (ref 0.6–1.2)
Glucose, Bld: 148 mg/dL — ABNORMAL HIGH (ref 73–118)
Potassium: 3.9 mEq/L (ref 3.3–4.7)
Sodium: 133 mEq/L (ref 128–145)
Total Bilirubin: 1 mg/dl (ref 0.20–1.60)
Total Protein: 7.2 g/dL (ref 6.4–8.1)

## 2016-11-17 LAB — IRON AND TIBC
%SAT: 28 % (ref 21–57)
Iron: 106 ug/dL (ref 41–142)
TIBC: 380 ug/dL (ref 236–444)
UIBC: 274 ug/dL (ref 120–384)

## 2016-11-17 LAB — CEA (IN HOUSE-CHCC): CEA (CHCC-In House): 6.21 ng/mL — ABNORMAL HIGH (ref 0.00–5.00)

## 2016-11-17 MED ORDER — AZITHROMYCIN 250 MG PO TABS: ORAL_TABLET | ORAL | 0 refills | Status: DC

## 2016-11-17 MED ORDER — SODIUM CHLORIDE 0.9 % IV SOLN
1.0000 g | Freq: Once | INTRAVENOUS | Status: AC
  Administered 2016-11-17: 1 g via INTRAVENOUS
  Filled 2016-11-17: qty 1

## 2016-11-17 MED ORDER — SODIUM CHLORIDE 0.9% FLUSH
10.0000 mL | INTRAVENOUS | Status: DC | PRN
  Administered 2016-11-17: 10 mL via INTRAVENOUS
  Filled 2016-11-17: qty 10

## 2016-11-17 MED ORDER — SODIUM CHLORIDE 0.9 % IV SOLN
Freq: Once | INTRAVENOUS | Status: AC
  Administered 2016-11-17: 12:00:00 via INTRAVENOUS

## 2016-11-17 MED ORDER — HEPARIN SOD (PORK) LOCK FLUSH 100 UNIT/ML IV SOLN
500.0000 [IU] | Freq: Once | INTRAVENOUS | Status: AC
  Administered 2016-11-17: 500 [IU] via INTRAVENOUS
  Filled 2016-11-17: qty 5

## 2016-11-17 MED ORDER — DRONABINOL 5 MG PO CAPS: 5.0000 mg | ORAL_CAPSULE | Freq: Two times a day (BID) | ORAL | 0 refills | Status: DC

## 2016-11-17 NOTE — Progress Notes (Signed)
Hematology and Oncology Follow Up Visit  Melanie [name] [id number] 1934/12/11 82 y.o. 11/17/2016   Principle Diagnosis:  Stage IIIB adenocarcinoma of the descending colon, 1/25 lymph nodes positive  Current Therapy:   FOLFOX q 14 days s/p cycle 3   Interim History: Melanie [name] is here today with her sister for follow-up and treatment. She is symptomatic with fatigue, weakness, productive cough with clear phlegm. She states that she has no appetite and has not been staying hydrated. She would like fluids today.  Her weight is down 7 lbs today. She feels that the megace has not helped and would like to try marinol.  Chest xray this morning was negative.  She denies fever, chills, n/v, rash, dizziness, SOB, chest pain, palpitations, abdominal pain or changes in bowel or bladder habits.  No swelling, tenderness, numbness or tingling in her extremities. No c/o pain at this time.   ECOG Performance Status: 0 - Asymptomatic  Medications:  Allergies as of 11/17/2016   Not on File     Medication List       Accurate as of 11/17/16 10:37 AM. Always use your most recent med list.          amLODipine 10 MG tablet Commonly known as:  NORVASC Take 10 mg by mouth daily.   aspirin EC 81 MG tablet Take 81 mg by mouth daily.   calcium carbonate 1500 (600 Ca) MG Tabs tablet Commonly known as:  OSCAL Take 600 mg by mouth daily.   dexamethasone 4 MG tablet Commonly known as:  DECADRON Take 2 tablets by mouth daily. Start day after chemotherapy for 2 days.   ferrous sulfate 325 (65 FE) MG tablet Take 325 mg by mouth daily.   levothyroxine 25 MCG tablet Commonly known as:  SYNTHROID, LEVOTHROID Take 25 mcg by mouth daily.   lidocaine-prilocaine cream Commonly known as:  EMLA Apply 1 application topically as needed.   lidocaine-prilocaine cream Commonly known as:  EMLA Apply 1 application topically as needed.   LORazepam 0.5 MG tablet Commonly known as:  ATIVAN Take 1 tablet  (0.5 mg total) by mouth every 6 (six) hours as needed (Nausea/Vomitting).   megestrol 625 MG/5ML suspension Commonly known as:  MEGACE ES Take 5 mLs (625 mg total) by mouth daily.   metoprolol tartrate 25 MG tablet Commonly known as:  LOPRESSOR Take 25 mg by mouth daily.   multivitamin tablet Take 1 tablet by mouth daily.   naproxen 250 MG tablet Commonly known as:  NAPROSYN Take 250 mg by mouth.   ondansetron 8 MG tablet Commonly known as:  ZOFRAN Take 1 tablet (8 mg total) by mouth 2 (two) times daily as needed for nausea or vomiting. Start 3 days after chemo.   oxyCODONE 5 MG immediate release tablet Commonly known as:  Oxy IR/ROXICODONE Take 5 mg by mouth as needed.   prochlorperazine 10 MG tablet Commonly known as:  COMPAZINE Take 1 tablet (10 mg total) by mouth every 6 (six) hours as needed for nausea or vomiting.   VALTREX 1000 MG tablet Generic drug:  valACYclovir Take 1,000 mg by mouth.       Allergies: Not on File  Past Medical History, Surgical history, Social history, and Family History were reviewed and updated.  Review of Systems: All other 10 point review of systems is negative.   Physical Exam:  vitals were not taken for this visit.  Wt Readings from Last 3 Encounters:  11/08/16 146 lb (66.2 kg)  10/26/16  150 lb 8 oz (68.3 kg)  09/30/16 155 lb 1.9 oz (70.4 kg)    Ocular: Sclerae unicteric, pupils equal, round and reactive to light Ear-nose-throat: Oropharynx clear, dentition fair Lymphatic: No cervical, supraclavicular or axillary adenopathy Lungs no rales or rhonchi, good excursion bilaterally Heart regular rate and rhythm, no murmur appreciated Abd soft, nontender, positive bowel sounds, no liver or spleen tip palpated on exam, no fluid wave  MSK no focal spinal tenderness, no joint edema Neuro: non-focal, well-oriented, appropriate affect Breasts: Deferred   Lab Results  Component Value Date   WBC 9.1 11/17/2016   HGB 13.9 11/17/2016    HCT 39.5 11/17/2016   MCV 84 11/17/2016   PLT 163 11/17/2016   Lab Results  Component Value Date   FERRITIN 172 10/26/2016   IRON 33 (L) 10/26/2016   TIBC 309 10/26/2016   UIBC 276 10/26/2016   IRONPCTSAT 11 (L) 10/26/2016   Lab Results  Component Value Date   RBC 4.68 11/17/2016   No results found for: KPAFRELGTCHN, LAMBDASER, KAPLAMBRATIO No results found for: IGGSERUM, IGA, IGMSERUM No results found for: Odetta Pink, SPEI   Chemistry      Component Value Date/Time   NA 134 11/08/2016 0749   NA 137 09/30/2016 0940   K 4.1 11/08/2016 0749   K 4.7 09/30/2016 0940   CL 106 11/08/2016 0749   CO2 18 11/08/2016 0749   CO2 18 (L) 09/30/2016 0940   BUN 17 11/08/2016 0749   BUN 21.5 09/30/2016 0940   CREATININE 2.0 (H) 11/08/2016 0749   CREATININE 1.8 (H) 09/30/2016 0940      Component Value Date/Time   CALCIUM 9.2 11/08/2016 0749   CALCIUM 10.4 09/30/2016 0940   ALKPHOS 121 (H) 11/08/2016 0749   ALKPHOS 147 09/30/2016 0940   AST 46 (H) 11/08/2016 0749   AST 27 09/30/2016 0940   ALT 24 11/08/2016 0749   ALT 11 09/30/2016 0940   BILITOT 0.60 11/08/2016 0749   BILITOT 0.34 09/30/2016 0940      Impression and Plan: Melanie [name] is a pleasant 82 yo African American female with stage IIIB adenocarcinoma of the descending colon, 1/25 lymph nodes positive.  She has tolerated chemotherapy nicely so far. She will be due for cycle 4 next week.  She is here today with complaint of fatigue, weakness, productive cough, no appetite and dehydration.  Chest x-ray was clear. We will give her fluids for dehydration and Rocephin for the cough. She will go home on a Z-pack as well.  I have discontinued the megace and have given her a prescription for marinol 5 mg PO BID for appetite.   We will plan to see her back next week for follow-up, labs and treatment if she feels better.  Both she and her sister promise to contact our office  with andy questions or concerns.   Eliezer Bottom, NP 6/21/201810:37 AM

## 2016-11-17 NOTE — Patient Instructions (Signed)
Ceftriaxone injection What is this medicine? CEFTRIAXONE (sef try AX one) is a cephalosporin antibiotic. It is used to treat certain kinds of bacterial infections. It will not work for colds, flu, or other viral infections. This medicine may be used for other purposes; ask your health care provider or pharmacist if you have questions. COMMON BRAND NAME(S): Rocephin What should I tell my health care provider before I take this medicine? They need to know if you have any of these conditions: -any chronic illness -bowel disease, like colitis -both kidney and liver disease -high bilirubin level in newborn patients -an unusual or allergic reaction to ceftriaxone, other cephalosporin or penicillin antibiotics, foods, dyes, or preservatives -pregnant or trying to get pregnant -breast-feeding How should I use this medicine? This medicine is injected into a muscle or infused it into a vein. It is usually given in a medical office or clinic. If you are to give this medicine you will be taught how to inject it. Follow instructions carefully. Use your doses at regular intervals. Do not take your medicine more often than directed. Do not skip doses or stop your medicine early even if you feel better. Do not stop taking except on your doctor's advice. Talk to your pediatrician regarding the use of this medicine in children. Special care may be needed. Overdosage: If you think you have taken too much of this medicine contact a poison control center or emergency room at once. NOTE: This medicine is only for you. Do not share this medicine with others. What if I miss a dose? If you miss a dose, take it as soon as you can. If it is almost time for your next dose, take only that dose. Do not take double or extra doses. What may interact with this medicine? Do not take this medicine with any of the following medications: -intravenous calcium This medicine may also interact with the following medications: -birth  control pills This list may not describe all possible interactions. Give your health care provider a list of all the medicines, herbs, non-prescription drugs, or dietary supplements you use. Also tell them if you smoke, drink alcohol, or use illegal drugs. Some items may interact with your medicine. What should I watch for while using this medicine? Tell your doctor or health care professional if your symptoms do not improve or if they get worse. Do not treat diarrhea with over the counter products. Contact your doctor if you have diarrhea that lasts more than 2 days or if it is severe and watery. If you are being treated for a sexually transmitted disease, avoid sexual contact until you have finished your treatment. Having sex can infect your sexual partner. Calcium may bind to this medicine and cause lung or kidney problems. Avoid calcium products while taking this medicine and for 48 hours after taking the last dose of this medicine. What side effects may I notice from receiving this medicine? Side effects that you should report to your doctor or health care professional as soon as possible: -allergic reactions like skin rash, itching or hives, swelling of the face, lips, or tongue -breathing problems -fever, chills -irregular heartbeat -pain when passing urine -seizures -stomach pain, cramps -unusual bleeding, bruising -unusually weak or tired Side effects that usually do not require medical attention (report to your doctor or health care professional if they continue or are bothersome): -diarrhea -dizzy, drowsy -headache -nausea, vomiting -pain, swelling, irritation where injected -stomach upset -sweating This list may not describe all possible side effects.   Call your doctor for medical advice about side effects. You may report side effects to FDA at 1-800-FDA-1088. Where should I keep my medicine? Keep out of the reach of children. Store at room temperature below 25 degrees C (77  degrees F). Protect from light. Throw away any unused vials after the expiration date. NOTE: This sheet is a summary. It may not cover all possible information. If you have questions about this medicine, talk to your doctor, pharmacist, or health care provider.  2018 Elsevier/Gold Standard (2013-12-02 09:14:54)  

## 2016-11-22 ENCOUNTER — Ambulatory Visit (HOSPITAL_BASED_OUTPATIENT_CLINIC_OR_DEPARTMENT_OTHER)
Admission: RE | Admit: 2016-11-22 | Discharge: 2016-11-22 | Disposition: A | Discharge status: Home / Self Care | Payer: Medicare Other | Source: Ambulatory Visit | Attending: Hematology & Oncology | Admitting: Hematology & Oncology

## 2016-11-22 ENCOUNTER — Ambulatory Visit (HOSPITAL_BASED_OUTPATIENT_CLINIC_OR_DEPARTMENT_OTHER): Payer: Medicare Other | Admitting: Hematology & Oncology

## 2016-11-22 ENCOUNTER — Other Ambulatory Visit (HOSPITAL_BASED_OUTPATIENT_CLINIC_OR_DEPARTMENT_OTHER): Payer: Medicare Other

## 2016-11-22 ENCOUNTER — Ambulatory Visit: Payer: Medicare Other

## 2016-11-22 ENCOUNTER — Ambulatory Visit (HOSPITAL_BASED_OUTPATIENT_CLINIC_OR_DEPARTMENT_OTHER): Payer: Medicare Other

## 2016-11-22 ENCOUNTER — Other Ambulatory Visit: Payer: Self-pay | Admitting: Family

## 2016-11-22 VITALS — BP 135/89 | HR 110 | Temp 97.5°F | Resp 20 | Wt 136.0 lb

## 2016-11-22 DIAGNOSIS — I700 Atherosclerosis of aorta: Secondary | ICD-10-CM | POA: Diagnosis not present

## 2016-11-22 DIAGNOSIS — R634 Abnormal weight loss: Secondary | ICD-10-CM

## 2016-11-22 DIAGNOSIS — C186 Malignant neoplasm of descending colon: Secondary | ICD-10-CM | POA: Insufficient documentation

## 2016-11-22 DIAGNOSIS — D500 Iron deficiency anemia secondary to blood loss (chronic): Secondary | ICD-10-CM | POA: Diagnosis not present

## 2016-11-22 DIAGNOSIS — E860 Dehydration: Secondary | ICD-10-CM

## 2016-11-22 DIAGNOSIS — Z9081 Acquired absence of spleen: Secondary | ICD-10-CM | POA: Insufficient documentation

## 2016-11-22 DIAGNOSIS — Z905 Acquired absence of kidney: Secondary | ICD-10-CM | POA: Insufficient documentation

## 2016-11-22 DIAGNOSIS — G893 Neoplasm related pain (acute) (chronic): Secondary | ICD-10-CM

## 2016-11-22 DIAGNOSIS — K769 Liver disease, unspecified: Secondary | ICD-10-CM | POA: Diagnosis not present

## 2016-11-22 DIAGNOSIS — D259 Leiomyoma of uterus, unspecified: Secondary | ICD-10-CM | POA: Diagnosis not present

## 2016-11-22 LAB — CBC WITH DIFFERENTIAL (CANCER CENTER ONLY)
BASO#: 0 10*3/uL (ref 0.0–0.2)
BASO%: 0.2 % (ref 0.0–2.0)
EOS%: 0.1 % (ref 0.0–7.0)
Eosinophils Absolute: 0 10*3/uL (ref 0.0–0.5)
HCT: 40.2 % (ref 34.8–46.6)
HGB: 14.4 g/dL (ref 11.6–15.9)
LYMPH#: 1 10*3/uL (ref 0.9–3.3)
LYMPH%: 11 % — ABNORMAL LOW (ref 14.0–48.0)
MCH: 29.8 pg (ref 26.0–34.0)
MCHC: 35.8 g/dL (ref 32.0–36.0)
MCV: 83 fL (ref 81–101)
MONO#: 1.7 10*3/uL — ABNORMAL HIGH (ref 0.1–0.9)
MONO%: 18.7 % — ABNORMAL HIGH (ref 0.0–13.0)
NEUT#: 6.4 10*3/uL (ref 1.5–6.5)
NEUT%: 70 % (ref 39.6–80.0)
Platelets: 269 10*3/uL (ref 145–400)
RBC: 4.83 10*6/uL (ref 3.70–5.32)
RDW: 19.5 % — ABNORMAL HIGH (ref 11.1–15.7)
WBC: 9.2 10*3/uL (ref 3.9–10.0)

## 2016-11-22 LAB — CMP (CANCER CENTER ONLY)
ALT(SGPT): 18 U/L (ref 10–47)
AST: 41 U/L — ABNORMAL HIGH (ref 11–38)
Albumin: 4 g/dL (ref 3.3–5.5)
Alkaline Phosphatase: 132 U/L — ABNORMAL HIGH (ref 26–84)
BUN, Bld: 43 mg/dL — ABNORMAL HIGH (ref 7–22)
CO2: 15 mEq/L — ABNORMAL LOW (ref 18–33)
Calcium: 9.8 mg/dL (ref 8.0–10.3)
Chloride: 103 mEq/L (ref 98–108)
Creat: 2.4 mg/dl — ABNORMAL HIGH (ref 0.6–1.2)
Glucose, Bld: 135 mg/dL — ABNORMAL HIGH (ref 73–118)
Potassium: 3.5 mEq/L (ref 3.3–4.7)
Sodium: 131 mEq/L (ref 128–145)
Total Bilirubin: 0.7 mg/dl (ref 0.20–1.60)
Total Protein: 7.5 g/dL (ref 6.4–8.1)

## 2016-11-22 LAB — IRON AND TIBC
%SAT: 11 % — ABNORMAL LOW (ref 21–57)
Iron: 36 ug/dL — ABNORMAL LOW (ref 41–142)
TIBC: 320 ug/dL (ref 236–444)
UIBC: 284 ug/dL (ref 120–384)

## 2016-11-22 LAB — FERRITIN: Ferritin: 346 ng/ml — ABNORMAL HIGH (ref 9–269)

## 2016-11-22 MED ORDER — SODIUM CHLORIDE 0.9% FLUSH
10.0000 mL | INTRAVENOUS | Status: DC | PRN
  Administered 2016-11-22: 10 mL via INTRAVENOUS
  Filled 2016-11-22: qty 10

## 2016-11-22 MED ORDER — SODIUM CHLORIDE 0.9 % IV SOLN
Freq: Once | INTRAVENOUS | Status: AC
  Administered 2016-11-22: 09:00:00 via INTRAVENOUS

## 2016-11-22 MED ORDER — HEPARIN SOD (PORK) LOCK FLUSH 100 UNIT/ML IV SOLN
500.0000 [IU] | Freq: Once | INTRAVENOUS | Status: AC
  Administered 2016-11-22: 500 [IU] via INTRAVENOUS
  Filled 2016-11-22: qty 5

## 2016-11-22 NOTE — Progress Notes (Signed)
Hematology and Oncology Follow Up Visit  Melanie [name] [id number] 28-Dec-1934 82 y.o. 11/22/2016   Principle Diagnosis:  Stage IIIB adenocarcinoma of the descending colon, 1/25 lymph nodes positive  Current Therapy:   FOLFOX q 14 days s/p cycle #3 - DC'd secondary to non-tolerance   Interim History:  Melanie [name] is here today with her sister for follow-up and treatment. She really does not look good. She has lost 10 more pounds. She just is not tolerating chemotherapy. Not sure as to what might be going on. She is complaining of some pain over in the left side. She had her kidney taken out on this side. Her colon cancer was also on the left side.  I probably will have to get a CT scan to see what is going on.  I think it is apparent that she just is not going to handle any more chemotherapy. As far as she is trying, the weight loss is just too much. Her performance status now is probably ECOG 3.   Her last CEA was 6.2.     Medications:  Allergies as of 11/22/2016   Not on File     Medication List       Accurate as of 11/22/16  9:05 AM. Always use your most recent med list.          amLODipine 10 MG tablet Commonly known as:  NORVASC Take 10 mg by mouth daily.   aspirin EC 81 MG tablet Take 81 mg by mouth daily.   azithromycin 250 MG tablet Commonly known as:  ZITHROMAX Z-PAK Take as directed on package.   calcium carbonate 1500 (600 Ca) MG Tabs tablet Commonly known as:  OSCAL Take 600 mg by mouth daily.   dexamethasone 4 MG tablet Commonly known as:  DECADRON Take 2 tablets by mouth daily. Start day after chemotherapy for 2 days.   dronabinol 5 MG capsule Commonly known as:  MARINOL Take 1 capsule (5 mg total) by mouth 2 (two) times daily before a meal.   ferrous sulfate 325 (65 FE) MG tablet Take 325 mg by mouth daily.   levothyroxine 25 MCG tablet Commonly known as:  SYNTHROID, LEVOTHROID Take 25 mcg by mouth daily.   lidocaine-prilocaine  cream Commonly known as:  EMLA Apply 1 application topically as needed.   lidocaine-prilocaine cream Commonly known as:  EMLA Apply 1 application topically as needed.   LORazepam 0.5 MG tablet Commonly known as:  ATIVAN Take 1 tablet (0.5 mg total) by mouth every 6 (six) hours as needed (Nausea/Vomitting).   metoprolol tartrate 25 MG tablet Commonly known as:  LOPRESSOR Take 25 mg by mouth daily.   multivitamin tablet Take 1 tablet by mouth daily.   naproxen 250 MG tablet Commonly known as:  NAPROSYN Take 250 mg by mouth.   ondansetron 8 MG tablet Commonly known as:  ZOFRAN Take 1 tablet (8 mg total) by mouth 2 (two) times daily as needed for nausea or vomiting. Start 3 days after chemo.   oxyCODONE 5 MG immediate release tablet Commonly known as:  Oxy IR/ROXICODONE Take 5 mg by mouth as needed.   prochlorperazine 10 MG tablet Commonly known as:  COMPAZINE Take 1 tablet (10 mg total) by mouth every 6 (six) hours as needed for nausea or vomiting.   VALTREX 1000 MG tablet Generic drug:  valACYclovir Take 1,000 mg by mouth.       Allergies: Not on File  Past Medical History, Surgical history, Social history, and  Family History were reviewed and updated.  Review of Systems: All other 10 point review of systems is negative.   Physical Exam:  weight is 136 lb (61.7 kg). Her oral temperature is 97.5 F (36.4 C). Her blood pressure is 135/89 and her pulse is 110 (abnormal). Her respiration is 20 and oxygen saturation is 99%.   Wt Readings from Last 3 Encounters:  11/22/16 136 lb (61.7 kg)  11/17/16 139 lb (63 kg)  11/08/16 146 lb (66.2 kg)    Head and neck exam shows no ocular or oral lesions. There are no palpable cervical or supraclavicular lymph nodes. Lungs are clear bilaterally. Cardiac exam regular rate and rhythm with no murmurs, rubs or bruits. Abdomen is soft. She has the laparoscopy scars. She has the temporary colostomy centered in the lower abdomen. She  has good bowel sounds. There is no guarding or rebound tenderness. There is no obvious abdominal mass. There is no palpable liver or spleen tip. Back exam shows no tenderness over the spine, ribs or hips. Extremities shows no clubbing, cyanosis or edema. Skin exam shows no rashes, ecchymoses or petechia. Deferred   Lab Results  Component Value Date   WBC 9.2 11/22/2016   HGB 14.4 11/22/2016   HCT 40.2 11/22/2016   MCV 83 11/22/2016   PLT 269 11/22/2016   Lab Results  Component Value Date   FERRITIN 517 (H) 11/17/2016   IRON 106 11/17/2016   TIBC 380 11/17/2016   UIBC 274 11/17/2016   IRONPCTSAT 28 11/17/2016   Lab Results  Component Value Date   RBC 4.83 11/22/2016   No results found for: KPAFRELGTCHN, LAMBDASER, KAPLAMBRATIO No results found for: IGGSERUM, IGA, IGMSERUM No results found for: Kathrynn Ducking, MSPIKE, SPEI   Chemistry      Component Value Date/Time   NA 131 11/22/2016 0806   NA 137 09/30/2016 0940   K 3.5 11/22/2016 0806   K 4.7 09/30/2016 0940   CL 103 11/22/2016 0806   CO2 15 (L) 11/22/2016 0806   CO2 18 (L) 09/30/2016 0940   BUN 43 (H) 11/22/2016 0806   BUN 21.5 09/30/2016 0940   CREATININE 2.4 (H) 11/22/2016 0806   CREATININE 1.8 (H) 09/30/2016 0940      Component Value Date/Time   CALCIUM 9.8 11/22/2016 0806   CALCIUM 10.4 09/30/2016 0940   ALKPHOS 132 (H) 11/22/2016 0806   ALKPHOS 147 09/30/2016 0940   AST 41 (H) 11/22/2016 0806   AST 27 09/30/2016 0940   ALT 18 11/22/2016 0806   ALT 11 09/30/2016 0940   BILITOT 0.70 11/22/2016 0806   BILITOT 0.34 09/30/2016 0940      Impression and Plan: Melanie [name] is a very pleasant 82 yo African American female with stage IIIB adenocarcinoma of the descending colon, 1/25 lymph nodes positive.   We will stop chemotherapy now. There is was at risk that her cancer will come back. I know that the CEA is slightly elevated. This might be secondary to her renal  function not doing all that great. She only has one kidney.   I will see about getting a CAT scan done. Hopefully, this can be done today. It will have to be done without IV contrast.   She does have the colostomy. She probably can now get this reversed in about 2 months.   I will like to see her back in about 3 weeks or so. Over, she will start feeling better and gaining some weight.  I spent about 30 minutes with she and her sister.   [name], MD 6/26/20189:05 AM

## 2016-11-22 NOTE — Patient Instructions (Signed)
Implanted Port Insertion, Care After °This sheet gives you information about how to care for yourself after your procedure. Your health care provider may also give you more specific instructions. If you have problems or questions, contact your health care provider. °What can I expect after the procedure? °After your procedure, it is common to have: °· Discomfort at the port insertion site. °· Bruising on the skin over the port. This should improve over 3-4 days. ° °Follow these instructions at home: °Port care °· After your port is placed, you will get a manufacturer's information card. The card has information about your port. Keep this card with you at all times. °· Take care of the port as told by your health care provider. Ask your health care provider if you or a family member can get training for taking care of the port at home. A home health care nurse may also take care of the port. °· Make sure to remember what type of port you have. °Incision care °· Follow instructions from your health care provider about how to take care of your port insertion site. Make sure you: °? Wash your hands with soap and water before you change your bandage (dressing). If soap and water are not available, use hand sanitizer. °? Change your dressing as told by your health care provider. °? Leave stitches (sutures), skin glue, or adhesive strips in place. These skin closures may need to stay in place for 2 weeks or longer. If adhesive strip edges start to loosen and curl up, you may trim the loose edges. Do not remove adhesive strips completely unless your health care provider tells you to do that. °· Check your port insertion site every day for signs of infection. Check for: °? More redness, swelling, or pain. °? More fluid or blood. °? Warmth. °? Pus or a bad smell. °General instructions °· Do not take baths, swim, or use a hot tub until your health care provider approves. °· Do not lift anything that is heavier than 10 lb (4.5  kg) for a week, or as told by your health care provider. °· Ask your health care provider when it is okay to: °? Return to work or school. °? Resume usual physical activities or sports. °· Do not drive for 24 hours if you were given a medicine to help you relax (sedative). °· Take over-the-counter and prescription medicines only as told by your health care provider. °· Wear a medical alert bracelet in case of an emergency. This will tell any health care providers that you have a port. °· Keep all follow-up visits as told by your health care provider. This is important. °Contact a health care provider if: °· You cannot flush your port with saline as directed, or you cannot draw blood from the port. °· You have a fever or chills. °· You have more redness, swelling, or pain around your port insertion site. °· You have more fluid or blood coming from your port insertion site. °· Your port insertion site feels warm to the touch. °· You have pus or a bad smell coming from the port insertion site. °Get help right away if: °· You have chest pain or shortness of breath. °· You have bleeding from your port that you cannot control. °Summary °· Take care of the port as told by your health care provider. °· Change your dressing as told by your health care provider. °· Keep all follow-up visits as told by your health care provider. °  This information is not intended to replace advice given to you by your health care provider. Make sure you discuss any questions you have with your health care provider. °Document Released: 03/06/2013 Document Revised: 04/06/2016 Document Reviewed: 04/06/2016 °Elsevier Interactive Patient Education © 2017 Elsevier Inc. ° °

## 2016-11-22 NOTE — Patient Instructions (Signed)
Dehydration, Adult Dehydration is when there is not enough fluid or water in your body. This happens when you lose more fluids than you take in. Dehydration can range from mild to very bad. It should be treated right away to keep it from getting very bad. Symptoms of mild dehydration may include:  Thirst.  Dry lips.  Slightly dry mouth.  Dry, warm skin.  Dizziness. Symptoms of moderate dehydration may include:  Very dry mouth.  Muscle cramps.  Dark pee (urine). Pee may be the color of tea.  Your body making less pee.  Your eyes making fewer tears.  Heartbeat that is uneven or faster than normal (palpitations).  Headache.  Light-headedness, especially when you stand up from sitting.  Fainting (syncope). Symptoms of very bad dehydration may include:  Changes in skin, such as: ? Cold and clammy skin. ? Blotchy (mottled) or pale skin. ? Skin that does not quickly return to normal after being lightly pinched and let go (poor skin turgor).  Changes in body fluids, such as: ? Feeling very thirsty. ? Your eyes making fewer tears. ? Not sweating when body temperature is high, such as in hot weather. ? Your body making very little pee.  Changes in vital signs, such as: ? Weak pulse. ? Pulse that is more than 100 beats a minute when you are sitting still. ? Fast breathing. ? Low blood pressure.  Other changes, such as: ? Sunken eyes. ? Cold hands and feet. ? Confusion. ? Lack of energy (lethargy). ? Trouble waking up from sleep. ? Short-term weight loss. ? Unconsciousness. Follow these instructions at home:  If told by your doctor, drink an ORS: ? Make an ORS by using instructions on the package. ? Start by drinking small amounts, about  cup (120 mL) every 5-10 minutes. ? Slowly drink more until you have had the amount that your doctor said to have.  Drink enough clear fluid to keep your pee clear or pale yellow. If you were told to drink an ORS, finish the ORS  first, then start slowly drinking clear fluids. Drink fluids such as: ? Water. Do not drink only water by itself. Doing that can make the salt (sodium) level in your body get too low (hyponatremia). ? Ice chips. ? Fruit juice that you have added water to (diluted). ? Low-calorie sports drinks.  Avoid: ? Alcohol. ? Drinks that have a lot of sugar. These include high-calorie sports drinks, fruit juice that does not have water added, and soda. ? Caffeine. ? Foods that are greasy or have a lot of fat or sugar.  Take over-the-counter and prescription medicines only as told by your doctor.  Do not take salt tablets. Doing that can make the salt level in your body get too high (hypernatremia).  Eat foods that have minerals (electrolytes). Examples include bananas, oranges, potatoes, tomatoes, and spinach.  Keep all follow-up visits as told by your doctor. This is important. Contact a doctor if:  You have belly (abdominal) pain that: ? Gets worse. ? Stays in one area (localizes).  You have a rash.  You have a stiff neck.  You get angry or annoyed more easily than normal (irritability).  You are more sleepy than normal.  You have a harder time waking up than normal.  You feel: ? Weak. ? Dizzy. ? Very thirsty.  You have peed (urinated) only a small amount of very dark pee during 6-8 hours. Get help right away if:  You have symptoms of   very bad dehydration.  You cannot drink fluids without throwing up (vomiting).  Your symptoms get worse with treatment.  You have a fever.  You have a very bad headache.  You are throwing up or having watery poop (diarrhea) and it: ? Gets worse. ? Does not go away.  You have blood or something green (bile) in your throw-up.  You have blood in your poop (stool). This may cause poop to look black and tarry.  You have not peed in 6-8 hours.  You pass out (faint).  Your heart rate when you are sitting still is more than 100 beats a  minute.  You have trouble breathing. This information is not intended to replace advice given to you by your health care provider. Make sure you discuss any questions you have with your health care provider. Document Released: 03/12/2009 Document Revised: 12/04/2015 Document Reviewed: 07/10/2015 Elsevier Interactive Patient Education  2018 Elsevier Inc.  

## 2016-12-05 ENCOUNTER — Telehealth: Payer: Self-pay | Admitting: *Deleted

## 2016-12-05 NOTE — Telephone Encounter (Signed)
Received call from [place] patients sister.  Patietn has not been eating or drinking much lately and also having issues with pain.  Oxycodone is relieving pain though.  Dr. Marin Olp notified.  Patient to come in for labwork, MD and possible fluids tomorrow.

## 2016-12-06 ENCOUNTER — Ambulatory Visit (HOSPITAL_BASED_OUTPATIENT_CLINIC_OR_DEPARTMENT_OTHER): Payer: Medicare Other | Admitting: Family

## 2016-12-06 ENCOUNTER — Ambulatory Visit (HOSPITAL_BASED_OUTPATIENT_CLINIC_OR_DEPARTMENT_OTHER): Payer: Medicare Other

## 2016-12-06 ENCOUNTER — Other Ambulatory Visit (HOSPITAL_BASED_OUTPATIENT_CLINIC_OR_DEPARTMENT_OTHER): Payer: Medicare Other

## 2016-12-06 ENCOUNTER — Other Ambulatory Visit: Payer: Self-pay

## 2016-12-06 ENCOUNTER — Ambulatory Visit: Payer: Medicare Other

## 2016-12-06 ENCOUNTER — Ambulatory Visit: Payer: Federal, State, Local not specified - PPO

## 2016-12-06 ENCOUNTER — Emergency Department (HOSPITAL_BASED_OUTPATIENT_CLINIC_OR_DEPARTMENT_OTHER): Payer: Medicare Other

## 2016-12-06 ENCOUNTER — Inpatient Hospital Stay (HOSPITAL_BASED_OUTPATIENT_CLINIC_OR_DEPARTMENT_OTHER)
Admission: EM | Admit: 2016-12-06 | Discharge: 2016-12-10 | DRG: 682 | Disposition: A | Discharge status: Home Health | Payer: Medicare Other | Attending: Internal Medicine | Admitting: Internal Medicine

## 2016-12-06 ENCOUNTER — Encounter (HOSPITAL_BASED_OUTPATIENT_CLINIC_OR_DEPARTMENT_OTHER): Payer: Self-pay

## 2016-12-06 VITALS — Wt 133.0 lb

## 2016-12-06 DIAGNOSIS — Z933 Colostomy status: Secondary | ICD-10-CM | POA: Diagnosis not present

## 2016-12-06 DIAGNOSIS — Z800 Family history of malignant neoplasm of digestive organs: Secondary | ICD-10-CM

## 2016-12-06 DIAGNOSIS — Z79899 Other long term (current) drug therapy: Secondary | ICD-10-CM

## 2016-12-06 DIAGNOSIS — Z6822 Body mass index (BMI) 22.0-22.9, adult: Secondary | ICD-10-CM

## 2016-12-06 DIAGNOSIS — R5383 Other fatigue: Secondary | ICD-10-CM

## 2016-12-06 DIAGNOSIS — Z905 Acquired absence of kidney: Secondary | ICD-10-CM

## 2016-12-06 DIAGNOSIS — C787 Secondary malignant neoplasm of liver and intrahepatic bile duct: Secondary | ICD-10-CM | POA: Diagnosis present

## 2016-12-06 DIAGNOSIS — E860 Dehydration: Secondary | ICD-10-CM | POA: Diagnosis present

## 2016-12-06 DIAGNOSIS — E039 Hypothyroidism, unspecified: Secondary | ICD-10-CM | POA: Diagnosis present

## 2016-12-06 DIAGNOSIS — Z9081 Acquired absence of spleen: Secondary | ICD-10-CM

## 2016-12-06 DIAGNOSIS — C186 Malignant neoplasm of descending colon: Secondary | ICD-10-CM | POA: Diagnosis present

## 2016-12-06 DIAGNOSIS — N179 Acute kidney failure, unspecified: Principal | ICD-10-CM | POA: Diagnosis present

## 2016-12-06 DIAGNOSIS — D500 Iron deficiency anemia secondary to blood loss (chronic): Secondary | ICD-10-CM

## 2016-12-06 DIAGNOSIS — Z9049 Acquired absence of other specified parts of digestive tract: Secondary | ICD-10-CM

## 2016-12-06 DIAGNOSIS — K769 Liver disease, unspecified: Secondary | ICD-10-CM | POA: Diagnosis not present

## 2016-12-06 DIAGNOSIS — R778 Other specified abnormalities of plasma proteins: Secondary | ICD-10-CM

## 2016-12-06 DIAGNOSIS — R6510 Systemic inflammatory response syndrome (SIRS) of non-infectious origin without acute organ dysfunction: Secondary | ICD-10-CM | POA: Diagnosis present

## 2016-12-06 DIAGNOSIS — Z7982 Long term (current) use of aspirin: Secondary | ICD-10-CM

## 2016-12-06 DIAGNOSIS — Z87891 Personal history of nicotine dependence: Secondary | ICD-10-CM

## 2016-12-06 DIAGNOSIS — N183 Chronic kidney disease, stage 3 unspecified: Secondary | ICD-10-CM | POA: Diagnosis present

## 2016-12-06 DIAGNOSIS — I129 Hypertensive chronic kidney disease with stage 1 through stage 4 chronic kidney disease, or unspecified chronic kidney disease: Secondary | ICD-10-CM | POA: Diagnosis present

## 2016-12-06 DIAGNOSIS — R627 Adult failure to thrive: Secondary | ICD-10-CM | POA: Diagnosis present

## 2016-12-06 DIAGNOSIS — R748 Abnormal levels of other serum enzymes: Secondary | ICD-10-CM | POA: Diagnosis not present

## 2016-12-06 DIAGNOSIS — E43 Unspecified severe protein-calorie malnutrition: Secondary | ICD-10-CM | POA: Diagnosis present

## 2016-12-06 DIAGNOSIS — R7989 Other specified abnormal findings of blood chemistry: Secondary | ICD-10-CM

## 2016-12-06 DIAGNOSIS — R0602 Shortness of breath: Secondary | ICD-10-CM

## 2016-12-06 DIAGNOSIS — N390 Urinary tract infection, site not specified: Secondary | ICD-10-CM

## 2016-12-06 HISTORY — DX: Malignant neoplasm of colon, unspecified: C18.9

## 2016-12-06 HISTORY — DX: Essential (primary) hypertension: I10

## 2016-12-06 HISTORY — DX: Hypothyroidism, unspecified: E03.9

## 2016-12-06 LAB — CBC WITH DIFFERENTIAL/PLATELET
Basophils Absolute: 0 10*3/uL (ref 0.0–0.1)
Basophils Relative: 0 %
Eosinophils Absolute: 0 10*3/uL (ref 0.0–0.7)
Eosinophils Relative: 0 %
HCT: 37.9 % (ref 36.0–46.0)
Hemoglobin: 13.6 g/dL (ref 12.0–15.0)
Lymphocytes Relative: 5 %
Lymphs Abs: 1 10*3/uL (ref 0.7–4.0)
MCH: 30.4 pg (ref 26.0–34.0)
MCHC: 35.9 g/dL (ref 30.0–36.0)
MCV: 84.6 fL (ref 78.0–100.0)
Monocytes Absolute: 1.6 10*3/uL — ABNORMAL HIGH (ref 0.1–1.0)
Monocytes Relative: 8 %
Neutro Abs: 17.9 10*3/uL — ABNORMAL HIGH (ref 1.7–7.7)
Neutrophils Relative %: 87 %
Platelets: 233 10*3/uL (ref 150–400)
RBC: 4.48 MIL/uL (ref 3.87–5.11)
RDW: 21.5 % — ABNORMAL HIGH (ref 11.5–15.5)
WBC: 20.5 10*3/uL — ABNORMAL HIGH (ref 4.0–10.5)

## 2016-12-06 LAB — CMP (CANCER CENTER ONLY)
ALT(SGPT): 23 U/L (ref 10–47)
AST: 47 U/L — ABNORMAL HIGH (ref 11–38)
Albumin: 3.1 g/dL — ABNORMAL LOW (ref 3.3–5.5)
Alkaline Phosphatase: 191 U/L — ABNORMAL HIGH (ref 26–84)
BUN, Bld: 52 mg/dL — ABNORMAL HIGH (ref 7–22)
CO2: 17 mEq/L — ABNORMAL LOW (ref 18–33)
Calcium: 10.1 mg/dL (ref 8.0–10.3)
Chloride: 101 mEq/L (ref 98–108)
Creat: 2.3 mg/dl — ABNORMAL HIGH (ref 0.6–1.2)
Glucose, Bld: 164 mg/dL — ABNORMAL HIGH (ref 73–118)
Potassium: 4.1 mEq/L (ref 3.3–4.7)
Sodium: 129 mEq/L (ref 128–145)
Total Bilirubin: 0.6 mg/dl (ref 0.20–1.60)
Total Protein: 7.8 g/dL (ref 6.4–8.1)

## 2016-12-06 LAB — URINALYSIS, ROUTINE W REFLEX MICROSCOPIC
Bilirubin Urine: NEGATIVE
Glucose, UA: NEGATIVE mg/dL
Hgb urine dipstick: NEGATIVE
Ketones, ur: NEGATIVE mg/dL
Nitrite: NEGATIVE
Protein, ur: 30 mg/dL — AB
Specific Gravity, Urine: 1.019 (ref 1.005–1.030)
pH: 5 (ref 5.0–8.0)

## 2016-12-06 LAB — URINALYSIS, MICROSCOPIC (REFLEX): RBC / HPF: NONE SEEN RBC/hpf (ref 0–5)

## 2016-12-06 LAB — TROPONIN I
Troponin I: 0.06 ng/mL (ref <0.03)
Troponin I: 0.06 ng/mL (ref <0.03)

## 2016-12-06 LAB — COMPREHENSIVE METABOLIC PANEL
ALT: 17 U/L (ref 14–54)
AST: 41 U/L (ref 15–41)
Albumin: 3.1 g/dL — ABNORMAL LOW (ref 3.5–5.0)
Alkaline Phosphatase: 177 U/L — ABNORMAL HIGH (ref 38–126)
Anion gap: 10 (ref 5–15)
BUN: 52 mg/dL — ABNORMAL HIGH (ref 6–20)
CO2: 16 mmol/L — ABNORMAL LOW (ref 22–32)
Calcium: 9.2 mg/dL (ref 8.9–10.3)
Chloride: 106 mmol/L (ref 101–111)
Creatinine, Ser: 2.31 mg/dL — ABNORMAL HIGH (ref 0.44–1.00)
GFR calc Af Amer: 22 mL/min — ABNORMAL LOW (ref >60)
GFR calc non Af Amer: 19 mL/min — ABNORMAL LOW (ref >60)
Glucose, Bld: 116 mg/dL — ABNORMAL HIGH (ref 65–99)
Potassium: 4.2 mmol/L (ref 3.5–5.1)
Sodium: 132 mmol/L — ABNORMAL LOW (ref 135–145)
Total Bilirubin: 0.3 mg/dL (ref 0.3–1.2)
Total Protein: 7.2 g/dL (ref 6.5–8.1)

## 2016-12-06 LAB — CBC WITH DIFFERENTIAL (CANCER CENTER ONLY)
BASO#: 0 10*3/uL (ref 0.0–0.2)
BASO%: 0.2 % (ref 0.0–2.0)
EOS%: 0.6 % (ref 0.0–7.0)
Eosinophils Absolute: 0.1 10*3/uL (ref 0.0–0.5)
HCT: 39.9 % (ref 34.8–46.6)
HGB: 13.9 g/dL (ref 11.6–15.9)
LYMPH#: 1.1 10*3/uL (ref 0.9–3.3)
LYMPH%: 6.2 % — ABNORMAL LOW (ref 14.0–48.0)
MCH: 29.6 pg (ref 26.0–34.0)
MCHC: 34.8 g/dL (ref 32.0–36.0)
MCV: 85 fL (ref 81–101)
MONO#: 1.5 10*3/uL — ABNORMAL HIGH (ref 0.1–0.9)
MONO%: 8.9 % (ref 0.0–13.0)
NEUT#: 14.3 10*3/uL — ABNORMAL HIGH (ref 1.5–6.5)
NEUT%: 84.1 % — ABNORMAL HIGH (ref 39.6–80.0)
Platelets: 245 10*3/uL (ref 145–400)
RBC: 4.69 10*6/uL (ref 3.70–5.32)
RDW: 21.4 % — ABNORMAL HIGH (ref 11.1–15.7)
WBC: 17 10*3/uL — ABNORMAL HIGH (ref 3.9–10.0)

## 2016-12-06 LAB — PROCALCITONIN: Procalcitonin: 8.18 ng/mL

## 2016-12-06 LAB — TSH: TSH: 3.839 u[IU]/mL (ref 0.350–4.500)

## 2016-12-06 LAB — BRAIN NATRIURETIC PEPTIDE: B Natriuretic Peptide: 41.1 pg/mL (ref 0.0–100.0)

## 2016-12-06 LAB — TECHNOLOGIST REVIEW CHCC SATELLITE

## 2016-12-06 LAB — LIPASE, BLOOD: Lipase: 81 U/L — ABNORMAL HIGH (ref 11–51)

## 2016-12-06 LAB — PROTIME-INR
INR: 1.18
Prothrombin Time: 15.1 seconds (ref 11.4–15.2)

## 2016-12-06 LAB — LACTIC ACID, PLASMA: Lactic Acid, Venous: 1.3 mmol/L (ref 0.5–1.9)

## 2016-12-06 MED ORDER — DEXTROSE 5 % IV SOLN
1.0000 g | Freq: Once | INTRAVENOUS | Status: AC
  Administered 2016-12-06: 1 g via INTRAVENOUS
  Filled 2016-12-06: qty 10

## 2016-12-06 MED ORDER — LEVOTHYROXINE SODIUM 25 MCG PO TABS
25.0000 ug | ORAL_TABLET | Freq: Every day | ORAL | Status: DC
  Administered 2016-12-06 – 2016-12-10 (×4): 25 ug via ORAL
  Filled 2016-12-06 (×4): qty 1

## 2016-12-06 MED ORDER — VANCOMYCIN HCL IN DEXTROSE 1-5 GM/200ML-% IV SOLN
1000.0000 mg | Freq: Once | INTRAVENOUS | Status: AC
  Administered 2016-12-06: 1000 mg via INTRAVENOUS
  Filled 2016-12-06: qty 200

## 2016-12-06 MED ORDER — PIPERACILLIN-TAZOBACTAM IN DEX 2-0.25 GM/50ML IV SOLN
2.2500 g | INTRAVENOUS | Status: AC
  Administered 2016-12-06: 2.25 g via INTRAVENOUS
  Filled 2016-12-06: qty 50

## 2016-12-06 MED ORDER — ONDANSETRON HCL 4 MG/2ML IJ SOLN: 4.0000 mg | Freq: Four times a day (QID) | INTRAMUSCULAR | Status: DC | PRN

## 2016-12-06 MED ORDER — ASPIRIN EC 81 MG PO TBEC
81.0000 mg | DELAYED_RELEASE_TABLET | Freq: Every day | ORAL | Status: DC
  Administered 2016-12-06 – 2016-12-10 (×5): 81 mg via ORAL
  Filled 2016-12-06 (×5): qty 1

## 2016-12-06 MED ORDER — VANCOMYCIN HCL 500 MG IV SOLR: 500.0000 mg | INTRAVENOUS | Status: DC

## 2016-12-06 MED ORDER — ACETAMINOPHEN 325 MG PO TABS: 650.0000 mg | ORAL_TABLET | Freq: Four times a day (QID) | ORAL | Status: DC | PRN

## 2016-12-06 MED ORDER — ORAL CARE MOUTH RINSE
15.0000 mL | Freq: Two times a day (BID) | OROMUCOSAL | Status: DC
  Administered 2016-12-06 – 2016-12-10 (×6): 15 mL via OROMUCOSAL

## 2016-12-06 MED ORDER — SODIUM CHLORIDE 0.9 % IV BOLUS (SEPSIS)
1000.0000 mL | Freq: Once | INTRAVENOUS | Status: AC
  Administered 2016-12-06: 1000 mL via INTRAVENOUS

## 2016-12-06 MED ORDER — METOPROLOL TARTRATE 25 MG PO TABS
25.0000 mg | ORAL_TABLET | Freq: Every day | ORAL | Status: DC
  Administered 2016-12-06: 25 mg via ORAL
  Filled 2016-12-06: qty 1

## 2016-12-06 MED ORDER — DRONABINOL 2.5 MG PO CAPS
5.0000 mg | ORAL_CAPSULE | Freq: Two times a day (BID) | ORAL | Status: DC
  Administered 2016-12-07 – 2016-12-10 (×6): 5 mg via ORAL
  Filled 2016-12-06 (×5): qty 2

## 2016-12-06 MED ORDER — PROCHLORPERAZINE MALEATE 10 MG PO TABS: 10.0000 mg | ORAL_TABLET | Freq: Four times a day (QID) | ORAL | Status: DC | PRN

## 2016-12-06 MED ORDER — SODIUM CHLORIDE 0.9 % IV SOLN
1000.0000 mL | Freq: Once | INTRAVENOUS | Status: AC
  Administered 2016-12-06: 1000 mL via INTRAVENOUS

## 2016-12-06 MED ORDER — ACETAMINOPHEN 650 MG RE SUPP: 650.0000 mg | Freq: Four times a day (QID) | RECTAL | Status: DC | PRN

## 2016-12-06 MED ORDER — LORAZEPAM 0.5 MG PO TABS: 0.5000 mg | ORAL_TABLET | Freq: Four times a day (QID) | ORAL | Status: DC | PRN

## 2016-12-06 MED ORDER — AMLODIPINE BESYLATE 10 MG PO TABS
10.0000 mg | ORAL_TABLET | Freq: Every day | ORAL | Status: DC
  Administered 2016-12-06: 10 mg via ORAL
  Filled 2016-12-06: qty 1

## 2016-12-06 MED ORDER — ONDANSETRON HCL 4 MG PO TABS: 4.0000 mg | ORAL_TABLET | Freq: Four times a day (QID) | ORAL | Status: DC | PRN

## 2016-12-06 MED ORDER — PIPERACILLIN-TAZOBACTAM IN DEX 2-0.25 GM/50ML IV SOLN
2.2500 g | Freq: Three times a day (TID) | INTRAVENOUS | Status: DC
  Administered 2016-12-07: 2.25 g via INTRAVENOUS
  Filled 2016-12-06 (×3): qty 50

## 2016-12-06 MED ORDER — SODIUM CHLORIDE 0.9 % IV SOLN
INTRAVENOUS | Status: DC
  Administered 2016-12-06 – 2016-12-10 (×7): via INTRAVENOUS

## 2016-12-06 MED ORDER — PIPERACILLIN-TAZOBACTAM 3.375 G IVPB 30 MIN: 3.3750 g | Freq: Once | INTRAVENOUS | Status: DC

## 2016-12-06 MED ORDER — OXYCODONE HCL 5 MG PO TABS: 5.0000 mg | ORAL_TABLET | ORAL | Status: DC | PRN

## 2016-12-06 MED ORDER — ENOXAPARIN SODIUM 30 MG/0.3ML ~~LOC~~ SOLN
30.0000 mg | SUBCUTANEOUS | Status: DC
  Administered 2016-12-06 – 2016-12-09 (×4): 30 mg via SUBCUTANEOUS
  Filled 2016-12-06 (×4): qty 0.3

## 2016-12-06 NOTE — H&P (Signed)
History and Physical    Melanie [name] Melanie [name]:[id number] DOB: 04-14-1935 DOA: 12/06/2016  PCP: Melanie [name] Consultants:  [place] - colorectal surgeon at [facility] [name] in [place]; Melanie [name] - oncology Patient coming from: Living with sister right now; usually lives in [place]; Melanie [name]: sister, [phone]; [phone]  Chief Complaint: weakness  HPI: Melanie [name] is a 82 y.o. female with medical history significant of colon cancer s/p colostomy; HTN; and hypothyroidism presenting with FTT.  Patient had surgery for the colorectal cancer on March 29 - she also required nephrectomy and splenectomy.  Her family reports a "great recovery".  Initially started chemo 5/30.  Did well on her first 2 rounds.  After the third round, she lost her appetite, had decreased PO intake, and has gradually become weaker and weaker.  She went 2 weeks ago for onc f/u - given IVF with some short-term improvement.  Over the last 2 weeks, she has had deteriorating energy, deteriorating fluid intake, and diminished appetite.  They stopped chemotherapy as a result.  Severe left-sided abdominal/flank pain.  When her sister would give her medication she would throw it up - as a result, she has had no meds for the last 3 days.  Her sister called Dr. Antonieta [name] office yesterday.  She went in today, was given 2L and IV abx and had blood work.  He was concerned for sepsis, bacteremia, UTI.  No urinary symptoms.  No fevers.  Very winded with exertion.  Only n/v with medication.  Has ostomy so uncertain about diarrhea.     ED Course:  Suspect dehydration in the setting of decreased PO; given IVF with persistent tachycardia.  Testing appears to indicate UTI.  Leukocytosis from 17 to 20.5.  Troponin elevated at 0.06.  Normal BNP, CXR.    Review of Systems: As per HPI; otherwise review of systems reviewed and negative.   Ambulatory Status:  Previously did not require asssitance but has needed a walker for the last 2 weeks  Past  Medical History:  Diagnosis Date  . Colon cancer ([place])   . Essential hypertension   . Goals of care, counseling/discussion 09/30/2016  . Hypothyroidism     Past Surgical History:  Procedure Laterality Date  . COLON SURGERY  07/2016  . NEPHRECTOMY  07/2016  . SPLENECTOMY  07/2016    Social History   Social History  . Marital status: Unknown    Spouse name: N/A  . Number of children: N/A  . Years of education: N/A   Occupational History  . retired Therapist, sports carrier    Social History Main Topics  . Smoking status: Former Smoker    Packs/day: 1.00    Years: 35.00    Quit date: 1993  . Smokeless tobacco: Never Used  . Alcohol use No  . Drug use: No  . Sexual activity: Not on file   Other Topics Concern  . Not on file   Social History Narrative  . No narrative on file    No Known Allergies  Family History  Problem Relation Age of Onset  . Colon cancer Mother 40       Died last year at 39 years old  . Colon cancer Brother 45    Prior to Admission medications   Medication Sig Start Date End Date Taking? Authorizing Provider  amLODipine (NORVASC) 10 MG tablet Take 10 mg by mouth daily.    [provider]  aspirin EC 81 MG tablet Take 81 mg by mouth daily.  [provider]  azithromycin (ZITHROMAX Z-PAK) 250 MG tablet Take as directed on package. 11/17/16   [place], Melanie Humbles, NP  calcium carbonate (OSCAL) 1500 (600 Ca) MG TABS tablet Take 600 mg by mouth daily.    [provider]  dexamethasone (DECADRON) 4 MG tablet Take 2 tablets by mouth daily. Start day after chemotherapy for 2 days. 10/11/16   Melanie Napoleon, MD  dronabinol (MARINOL) 5 MG capsule Take 1 capsule (5 mg total) by mouth 2 (two) times daily before a meal. 11/17/16   [place], Melanie Humbles, NP  ferrous sulfate 325 (65 FE) MG tablet Take 325 mg by mouth daily.    [provider]  levothyroxine (SYNTHROID, LEVOTHROID) 25 MCG tablet Take 25 mcg by mouth daily.     [provider]  lidocaine-prilocaine (EMLA) cream Apply 1 application topically as needed. 10/10/16   Melanie Napoleon, MD  lidocaine-prilocaine (EMLA) cream Apply 1 application topically as needed. 10/10/16   Melanie Napoleon, MD  LORazepam (ATIVAN) 0.5 MG tablet Take 1 tablet (0.5 mg total) by mouth every 6 (six) hours as needed (Nausea/Vomitting). 10/11/16   Melanie Napoleon, MD  metoprolol tartrate (LOPRESSOR) 25 MG tablet Take 25 mg by mouth daily.    [provider]  Multiple Vitamin (MULTIVITAMIN) tablet Take 1 tablet by mouth daily.    [provider]  naproxen (NAPROSYN) 250 MG tablet Take 250 mg by mouth. 05/27/16   [provider]  ondansetron (ZOFRAN) 8 MG tablet Take 1 tablet (8 mg total) by mouth 2 (two) times daily as needed for nausea or vomiting. Start 3 days after chemo. 10/11/16   Melanie Napoleon, MD  oxyCODONE (OXY IR/ROXICODONE) 5 MG immediate release tablet Take 5 mg by mouth as needed.    [provider]  prochlorperazine (COMPAZINE) 10 MG tablet Take 1 tablet (10 mg total) by mouth every 6 (six) hours as needed for nausea or vomiting. 10/11/16   Melanie Napoleon, MD  valACYclovir (VALTREX) 1000 MG tablet Take 1,000 mg by mouth. 09/21/16   [provider]    Physical Exam: Vitals:   12/06/16 1701 12/06/16 1730 12/06/16 1800 12/06/16 1927  BP: 128/75 124/73 122/78 126/85  Pulse: 91 97 (!) 103 98  Resp: 20 (!) 21 20 16   Temp:    98 F (36.7 C)  TempSrc:    Oral  SpO2: 98% 100% 100% 94%  Weight:    60.3 kg (132 lb 15 oz)  Height:    5\' 4"  (1.626 m)     General:  Appears calm and comfortable and is NAD but does appear chronically ill, allows her sisters to speak for her Eyes:  EOMI, normal lids, iris ENT:  grossly normal hearing, lips & tongue, mmm Neck:  no LAD, masses or thyromegaly Cardiovascular:  RRR, no m/r/g. No LE edema.  Respiratory:  CTA bilaterally, no w/r/r. Normal respiratory effort. Abdomen:  soft,  ntnd, NABS, ostomy present with soft brown stool Skin:  no rash or induration seen on limited exam Musculoskeletal:  grossly normal tone BUE/BLE, good ROM, no bony abnormality Psychiatric:  grossly normal mood and affect, speech fluent and appropriate, AOx3 Neurologic:  CN 2-12 grossly intact, moves all extremities in coordinated fashion, sensation intact  Labs on Admission: I have personally reviewed following labs and imaging studies  CBC:  Recent Labs Lab 12/06/16 1100 12/06/16 1541  WBC 17.0* 20.5*  NEUTROABS 14.3* 17.9*  HGB 13.9 13.6  HCT 39.9 37.9  MCV 85 84.6  PLT 245 229   Basic Metabolic Panel:  Recent Labs Lab 12/06/16 1100 12/06/16 1541  NA 129 132*  K 4.1 4.2  CL 101 106  CO2 17* 16*  GLUCOSE 164* 116*  BUN 52* 52*  CREATININE 2.3* 2.31*  CALCIUM 10.1 9.2   GFR: Estimated Creatinine Clearance: 17.1 mL/min (A) (by C-G formula based on SCr of 2.31 mg/dL (H)). Liver Function Tests:  Recent Labs Lab 12/06/16 1100 12/06/16 1541  AST 47* 41  ALT 23 17  ALKPHOS 191* 177*  BILITOT 0.60 0.3  PROT 7.8 7.2  ALBUMIN 3.1* 3.1*    Recent Labs Lab 12/06/16 1541  LIPASE 81*   No results for input(s): AMMONIA in the last 168 hours. Coagulation Profile:  Recent Labs Lab 12/06/16 1541  INR 1.18   Cardiac Enzymes:  Recent Labs Lab 12/06/16 1512 12/06/16 2148  TROPONINI 0.06* 0.06*   BNP (last 3 results) No results for input(s): PROBNP in the last 8760 hours. HbA1C: No results for input(s): HGBA1C in the last 72 hours. CBG: No results for input(s): GLUCAP in the last 168 hours. Lipid Profile: No results for input(s): CHOL, HDL, LDLCALC, TRIG, CHOLHDL, LDLDIRECT in the last 72 hours. Thyroid Function Tests:  Recent Labs  12/06/16 2148  TSH 3.839   Anemia Panel: No results for input(s): VITAMINB12, FOLATE, FERRITIN, TIBC, IRON, RETICCTPCT in the last 72 hours. Urine analysis:    Component Value Date/Time   COLORURINE YELLOW 12/06/2016  1654   APPEARANCEUR CLOUDY (A) 12/06/2016 1654   LABSPEC 1.019 12/06/2016 1654   PHURINE 5.0 12/06/2016 1654   GLUCOSEU NEGATIVE 12/06/2016 1654   HGBUR NEGATIVE 12/06/2016 1654   BILIRUBINUR NEGATIVE 12/06/2016 1654   KETONESUR NEGATIVE 12/06/2016 1654   PROTEINUR 30 (A) 12/06/2016 1654   NITRITE NEGATIVE 12/06/2016 1654   LEUKOCYTESUR TRACE (A) 12/06/2016 1654    Creatinine Clearance: Estimated Creatinine Clearance: 17.1 mL/min (A) (by C-G formula based on SCr of 2.31 mg/dL (H)).  Sepsis Labs: @LABRCNTIP (procalcitonin:4,lacticidven:4) )No results found for this or any previous visit (from the past 240 hour(s)).   Radiological Exams on Admission: Dg Chest 2 View  Result Date: 12/06/2016 CLINICAL DATA:  Shortness of breath and fatigue. History of colon carcinoma EXAM: CHEST  2 VIEW COMPARISON:  November 17, 2016 FINDINGS: Port-A-Cath tip is in the superior vena cava. No pneumothorax. There is no edema or consolidation. The heart size and pulmonary vascularity are normal. Aorta is tortuous but stable. No adenopathy evident. No bone lesions. IMPRESSION: Stable aortic tortuosity. No edema or consolidation. No adenopathy. Port-A-Cath unchanged in position. Electronically Signed   By: Lowella Grip III M.D.   On: 12/06/2016 15:41    EKG: Independently reviewed.  NSR with rate 83; nonspecific ST changes with no evidence of acute ischemia  Assessment/Plan Principal Problem:   SIRS (systemic inflammatory response syndrome) (HCC) Active Problems:   Cancer of left colon (HCC)   Failure to thrive in adult   Chronic renal insufficiency, stage 3 (moderate)   Pertinent labs: -Troponin 0.06 x 2 -Negative lactate -Procalcitonin 8.18 -Normal TSH -UA: rare bacteria, trace LE, 0-5 WBC -Glucose 116 -BUN 52/Creatinine 2.31/GFR 22; progressively worsening -AP 177 -Lipase 81 -WBC 20.5  -Patient previously diagnosed with colon cancer; had surgery with ostomy and was doing well.  Tolerated  chemotherapy x 2 and recently has developed FTT. -Has SIRS criteria to include elevated WBC count, tachycardia; normal lactate but markedly elevated procalcitonin. This patient meets SIRS Criteria and may be  septic. -ER suspected urinary source but her UA actually does not look overly concerning for infection at this time. -Blood and urine cultures pending -She is asplenic and so is at increased risk for infection with encapsulated organisms, but there is currently low suspicion for this based on her symptoms. -Treat with IV Vanc/Zosyn for now. -Will admit with telemetry and continue to monitor -The patient also had elevated troponin, but with her progressive renal failure this may be false positive.   -Her EKG showed borderline ST elevations. -Will trend troponins and repeat EKG in AM. -Recent CT showed several small indeterminate liver lesions; recommendation was for abdominal MRI with/without contrast.  Will order, as metastatic disease could explain her failure to thrive. -Dr. Marin Olp to consult. -We may need to consider palliative care consultation for this patient.    DVT prophylaxis: Lovenox  Code Status:  Full - confirmed with patient/family Family Communication: Siblings present throughout evaluation  Disposition Plan:  Home once clinically improved Consults called: Oncology  Admission status: Admit - It is my clinical opinion that admission to INPATIENT is reasonable and necessary because this patient will require at least 2 midnights in the hospital to treat this condition based on the medical complexity of the problems presented.  Given the aforementioned information, the predictability of an adverse outcome is felt to be significant.     Karmen Bongo MD [facility]  If 7PM-7AM, please contact night-coverage [email or web address] Password [facility]  12/07/2016, 2:36 AM

## 2016-12-06 NOTE — Progress Notes (Signed)
Pharmacy Antibiotic Note  Melanie [name] is a 82 y.o. female admitted on 12/06/2016 with sepsis.  Pharmacy has been consulted for vancomycin/Zosyn dosing.  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 FOLFOX treatment was stopped due to intolerance. She is now here today with c/o weakness, fatigue, loss of appetite, SOB with any exertion, n/v when taking her medication and left upper quadrant pain.    Plan:  Zosyn 2.25 gr IV q8h  Vancomycin 1000 mg IV, then 500 mg IV q48h  Monitor clinical course, renal function, cultures as available       Height: 5\' 4"  (162.6 cm) Weight: 132 lb 15 oz (60.3 kg) IBW/kg (Calculated) : 54.7  Temp (24hrs), Avg:97.6 F (36.4 C), Min:97.2 F (36.2 C), Max:98 F (36.7 C)   Recent Labs Lab 12/06/16 1100 12/06/16 1541  WBC 17.0* 20.5*  CREATININE 2.3* 2.31*    Estimated Creatinine Clearance: 17.1 mL/min (A) (by C-G formula based on SCr of 2.31 mg/dL (H)).    No Known Allergies  Antimicrobials this admission: 7/10 ceftriaxone x 1  7/10 Zosyn >>  7/10 vancomycin >>   Dose adjustments this admission: --  Microbiology results: 7/10 BCx: sent 7/10 UCx: sent    Thank you for allowing pharmacy to be a part of this patient's care.   Royetta Asal, PharmD, BCPS Pager [phone] 12/06/2016 9:46 PM

## 2016-12-06 NOTE — ED Triage Notes (Signed)
Pt c/o fatigue "few days"-sent from cancer center in building via w/c-NAD

## 2016-12-06 NOTE — ED Notes (Signed)
BSC provided for pt to attempt urine sample.

## 2016-12-06 NOTE — Addendum Note (Signed)
Addended by: Burney Gauze R on: 12/06/2016 02:08 PM   Modules accepted: Level of Service

## 2016-12-06 NOTE — Progress Notes (Signed)
CRITICAL VALUE ALERT  Critical Value:  Troponin 0.06  Date & Time Notied:  12/06/16 2336  Provider Notified: Baltazar Najjar  Orders Received/Actions taken:

## 2016-12-06 NOTE — Progress Notes (Signed)
Patient with PMH Stage IIIB adenocarcinoma of the descending colon, 1/25 lymph nodes positive. She was seen today by her oncologist and was refer for admission for FTT, SOB on exertion, fatigue, weakness. She was also found to have SIRS criteria. Per ED physician patient is stable. Vitals stable. Acceppted to telemetry. Needs Evaluation for SIRS.

## 2016-12-06 NOTE — Progress Notes (Addendum)
Hematology and Oncology Follow Up Visit  Melanie [name] [id number] 1935-05-18 82 y.o. 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 Melanie [name] is here today for an unscheduled visit with c/o fatigue, weakness, no appetite, n/v when taking medication, SOB with any exertion and left upper abdominal quadrant pain.  Her skin is clammy at this time, she is afebrile. She appears to be dehydrated. Weight is down 3 lbs since her last visit on 11/22/16.   Her WBC count is elevated at 17.0. Hgb stable at 13.9 with an MCV of 85. BUN 52 and Creatinine 2.3.  She is still having output in her ostomy but this has been noticeably less due to her decreased appetite.  No episodes of bleeding, bruising or petechiae. No lymphadenopathy found on exam.  She denies fevers at home, chills, cough, rash, dizziness, chest pain and palpitations.  She is only urinating 2 times a day. No pain on urination.  No swelling, tenderness, numbness or tingling in her extremities.   ECOG Performance Status: 3 - Symptomatic, >50% confined to bed  Medications:  Allergies as of 12/06/2016   Not on File     Medication List       Accurate as of 12/06/16 11:46 AM. Always use your most recent med list.          amLODipine 10 MG tablet Commonly known as:  NORVASC Take 10 mg by mouth daily.   aspirin EC 81 MG tablet Take 81 mg by mouth daily.   azithromycin 250 MG tablet Commonly known as:  ZITHROMAX Z-PAK Take as directed on package.   calcium carbonate 1500 (600 Ca) MG Tabs tablet Commonly known as:  OSCAL Take 600 mg by mouth daily.   dexamethasone 4 MG tablet Commonly known as:  DECADRON Take 2 tablets by mouth daily. Start day after chemotherapy for 2 days.   dronabinol 5 MG capsule Commonly known as:  MARINOL Take 1 capsule (5 mg total)  by mouth 2 (two) times daily before a meal.   ferrous sulfate 325 (65 FE) MG tablet Take 325 mg by mouth daily.   levothyroxine 25 MCG tablet Commonly known as:  SYNTHROID, LEVOTHROID Take 25 mcg by mouth daily.   lidocaine-prilocaine cream Commonly known as:  EMLA Apply 1 application topically as needed.   lidocaine-prilocaine cream Commonly known as:  EMLA Apply 1 application topically as needed.   LORazepam 0.5 MG tablet Commonly known as:  ATIVAN Take 1 tablet (0.5 mg total) by mouth every 6 (six) hours as needed (Nausea/Vomitting).   metoprolol tartrate 25 MG tablet Commonly known as:  LOPRESSOR Take 25 mg by mouth daily.   multivitamin tablet Take 1 tablet by mouth daily.   naproxen 250 MG tablet Commonly known as:  NAPROSYN Take 250 mg by mouth.   ondansetron 8 MG tablet Commonly known as:  ZOFRAN Take 1 tablet (8 mg total) by mouth 2 (two) times daily as needed for nausea or vomiting. Start 3 days after chemo.   oxyCODONE 5 MG immediate release tablet Commonly known as:  Oxy IR/ROXICODONE Take 5 mg by mouth as needed.   prochlorperazine 10 MG tablet Commonly known as:  COMPAZINE Take 1 tablet (10 mg total) by mouth every 6 (six) hours as needed for nausea or vomiting.   VALTREX 1000 MG tablet  Generic drug:  valACYclovir Take 1,000 mg by mouth.       Allergies: Not on File  Past Medical History, Surgical history, Social history, and Family History were reviewed and updated.  Review of Systems: All other 10 point review of systems is negative.   Physical Exam:  weight is 133 lb (60.3 kg).   Wt Readings from Last 3 Encounters:  12/06/16 133 lb (60.3 kg)  11/22/16 136 lb (61.7 kg)  11/17/16 139 lb (63 kg)    Ocular: Sclerae unicteric, pupils equal, round and reactive to light Ear-nose-throat: Oropharynx clear, dentition fair Lymphatic: No cervical, supraclavicular or axillary adenopathy Lungs no rales or rhonchi, good excursion  bilaterally Heart regular rate and rhythm, no murmur appreciated Abd soft, tender in left upper quadrant, positive bowel sounds, no liver tip palpated on exam, s/p splenectomy, no fluid wave MSK no focal spinal tenderness, no joint edema Neuro: non-focal, well-oriented, appropriate affect Breasts: Deferred   Lab Results  Component Value Date   WBC 17.0 (H) 12/06/2016   HGB 13.9 12/06/2016   HCT 39.9 12/06/2016   MCV 85 12/06/2016   PLT 245 12/06/2016   Lab Results  Component Value Date   FERRITIN 346 (H) 11/22/2016   IRON 36 (L) 11/22/2016   TIBC 320 11/22/2016   UIBC 284 11/22/2016   IRONPCTSAT 11 (L) 11/22/2016   Lab Results  Component Value Date   RBC 4.69 12/06/2016   No results found for: KPAFRELGTCHN, LAMBDASER, KAPLAMBRATIO No results found for: IGGSERUM, IGA, IGMSERUM No results found for: Kathrynn Ducking, MSPIKE, SPEI   Chemistry      Component Value Date/Time   NA 129 12/06/2016 1100   NA 137 09/30/2016 0940   K 4.1 12/06/2016 1100   K 4.7 09/30/2016 0940   CL 101 12/06/2016 1100   CO2 17 (L) 12/06/2016 1100   CO2 18 (L) 09/30/2016 0940   BUN 52 (H) 12/06/2016 1100   BUN 21.5 09/30/2016 0940   CREATININE 2.3 (H) 12/06/2016 1100   CREATININE 1.8 (H) 09/30/2016 0940      Component Value Date/Time   CALCIUM 10.1 12/06/2016 1100   CALCIUM 10.4 09/30/2016 0940   ALKPHOS 191 (H) 12/06/2016 1100   ALKPHOS 147 09/30/2016 0940   AST 47 (H) 12/06/2016 1100   AST 27 09/30/2016 0940   ALT 23 12/06/2016 1100   ALT 11 09/30/2016 0940   BILITOT 0.60 12/06/2016 1100   BILITOT 0.34 09/30/2016 0940      Impression and Plan: Ms. Melanie [name] is a pleasant 82 yo African American female with stage IIIB adenocarcinoma of the descending colon, 1/25 lymph node were positive. She had to stop treatment with FOLFOX due to intolerance. She is now here today with c/o weakness, fatigue, loss of appetite, SOB with any exertion, n/v when  taking her medication and left upper quadrant pain.  We have accessed her port a cath and she is now getting fluids.  WBC count has gone up to 17.0. I spoke with Dr. Marin Olp and we will take her down to the ED for further work up and admission to [facility].  I have spoken to the ER doctor and updated him on her past medical history with our office and current status.   Eliezer Bottom, NP 7/10/201811:46 AM    Addendum:  I saw and examined the patient. I am just very puzzled as to why she just is not getting better. 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 This could and probably does increase her risk of infection.  The CT scan that she had done was not conclusive. Of course, the radiologist felt that additional studies were necessary to identify these small indeterminate liver lesions. Also noted was some focal thickening of the stomach.  Her CEA 3 weeks ago was 6.2.  We are trying everything that we can think of to get her to feel better.  Unfortunately, I think she is going to have to be in the hospital. While in the hospital, we can get nutrition to see her. We can get physical therapy to see her. We can see about doing additional tests.  Her last chemotherapy was a month ago. This should be well out of her system.  I just feel so bad for Ms. Izzo. I know that she is trying. Her sister is doing her best to try to get her to feel better.  Again, given that her spleen is out, this will increase her risk of infection. It is possible she might be on the verge of sepsis and that we just are catching it before she decompensates even more.  We will send her to the emergency room for evaluation and eventual admission. She and her sister understand this.  Lattie Haw, MD

## 2016-12-06 NOTE — Progress Notes (Signed)
Patient transported he emergency room, via w/c.

## 2016-12-06 NOTE — ED Provider Notes (Signed)
[place] DEPT [facility] Provider Note   CSN: [id number] Arrival date & time: 12/06/16  1343     History   Chief Complaint Chief Complaint  Patient presents with  . Fatigue    HPI Melanie [name] is a 82 y.o. female.  The history is provided by the patient, a relative and medical records.  Shortness of Breath  This is a new problem. The average episode lasts 2 weeks. The problem occurs intermittently.The current episode started more than 1 week ago. The problem has not changed since onset.Pertinent negatives include no fever, no headaches, no rhinorrhea, no neck pain, no cough, no sputum production, no hemoptysis, no wheezing, no chest pain, no syncope, no vomiting, no abdominal pain, no rash, no leg pain and no leg swelling. She has tried nothing for the symptoms. The treatment provided no relief. Associated medical issues do not include asthma.    Past Medical History:  Diagnosis Date  . Colon cancer ([place])   . Goals of care, counseling/discussion 09/30/2016    Patient Active Problem List   Diagnosis Date Noted  . Iron deficiency anemia due to chronic blood loss 11/09/2016  . Goals of care, counseling/discussion 09/30/2016  . Cancer of left colon ([place]) 09/26/2016    Past Surgical History:  Procedure Laterality Date  . COLON SURGERY    . NEPHRECTOMY    . SPLENECTOMY      OB History    No data available       Home Medications    Prior to Admission medications   Medication Sig Start Date End Date Taking? Authorizing Provider  amLODipine (NORVASC) 10 MG tablet Take 10 mg by mouth daily.    [provider]  aspirin EC 81 MG tablet Take 81 mg by mouth daily.    [provider]  azithromycin (ZITHROMAX Z-PAK) 250 MG tablet Take as directed on package. 11/17/16   [place], Holli Humbles, NP  calcium carbonate (OSCAL) 1500 (600 Ca) MG TABS tablet Take 600 mg by mouth daily.    [provider]  dexamethasone (DECADRON) 4 MG tablet Take 2 tablets by  mouth daily. Start day after chemotherapy for 2 days. 10/11/16   [name], MD  dronabinol (MARINOL) 5 MG capsule Take 1 capsule (5 mg total) by mouth 2 (two) times daily before a meal. 11/17/16   [place], Holli Humbles, NP  ferrous sulfate 325 (65 FE) MG tablet Take 325 mg by mouth daily.    [provider]  levothyroxine (SYNTHROID, LEVOTHROID) 25 MCG tablet Take 25 mcg by mouth daily.    [provider]  lidocaine-prilocaine (EMLA) cream Apply 1 application topically as needed. 10/10/16   [name], MD  lidocaine-prilocaine (EMLA) cream Apply 1 application topically as needed. 10/10/16   [name], MD  LORazepam (ATIVAN) 0.5 MG tablet Take 1 tablet (0.5 mg total) by mouth every 6 (six) hours as needed (Nausea/Vomitting). 10/11/16   [name], MD  metoprolol tartrate (LOPRESSOR) 25 MG tablet Take 25 mg by mouth daily.    [provider]  Multiple Vitamin (MULTIVITAMIN) tablet Take 1 tablet by mouth daily.    [provider]  naproxen (NAPROSYN) 250 MG tablet Take 250 mg by mouth. 05/27/16   [provider]  ondansetron (ZOFRAN) 8 MG tablet Take 1 tablet (8 mg total) by mouth 2 (two) times daily as needed for nausea or vomiting. Start 3 days after chemo. 10/11/16   [name], MD  oxyCODONE (OXY  IR/ROXICODONE) 5 MG immediate release tablet Take 5 mg by mouth as needed.    [provider]  prochlorperazine (COMPAZINE) 10 MG tablet Take 1 tablet (10 mg total) by mouth every 6 (six) hours as needed for nausea or vomiting. 10/11/16   [name], MD  valACYclovir (VALTREX) 1000 MG tablet Take 1,000 mg by mouth. 09/21/16   [provider]    Family History No family history on file.  Social History Social History  Substance Use Topics  . Smoking status: Not on file  . Smokeless tobacco: Not on file  . Alcohol use Not on file     Allergies   Patient has no known allergies.   Review of Systems Review  of Systems  Constitutional: Positive for appetite change, chills and fatigue. Negative for diaphoresis and fever.  HENT: Negative for congestion and rhinorrhea.   Eyes: Negative for visual disturbance.  Respiratory: Positive for shortness of breath. Negative for cough, hemoptysis, sputum production, chest tightness, wheezing and stridor.   Cardiovascular: Negative for chest pain, palpitations, leg swelling and syncope.  Gastrointestinal: Positive for nausea. Negative for abdominal pain, constipation, diarrhea, rectal pain and vomiting.  Genitourinary: Negative for dysuria and flank pain.  Musculoskeletal: Positive for gait problem (too fatigued to walk). Negative for back pain, neck pain and neck stiffness.  Skin: Negative for rash and wound.  Neurological: Negative for light-headedness, numbness and headaches.  Psychiatric/Behavioral: Negative for agitation.  All other systems reviewed and are negative.    Physical Exam Updated Vital Signs BP (!) 127/94 (BP Location: Right Arm)   Pulse (!) 108   Temp (!) 97.2 F (36.2 C) (Oral)   Resp 16   Ht 5\' 4"  (1.626 m)   Wt 60.3 kg (133 lb)   SpO2 100%   BMI 22.83 kg/m   Physical Exam  Constitutional: She is oriented to person, place, and time. She appears well-developed and well-nourished. No distress.  HENT:  Head: Normocephalic and atraumatic.  Right Ear: External ear normal.  Left Ear: External ear normal.  Nose: Nose normal.  Mouth/Throat: Oropharynx is clear and moist. No oropharyngeal exudate.  Eyes: Conjunctivae and EOM are normal. Pupils are equal, round, and reactive to light.  Neck: Normal range of motion. Neck supple.  Cardiovascular: Regular rhythm and intact distal pulses.  Tachycardia present.   No murmur heard. Pulmonary/Chest: Effort normal. No stridor. No respiratory distress. She has no wheezes. She exhibits no tenderness.  Abdominal: Soft. Bowel sounds are normal. She exhibits no distension. There is no tenderness.  There is no rigidity, no rebound, no guarding and no CVA tenderness.    Musculoskeletal: She exhibits no tenderness.  Neurological: She is alert and oriented to person, place, and time. She has normal reflexes. No cranial nerve deficit or sensory deficit. She exhibits normal muscle tone. Coordination normal.  Skin: Skin is warm. Capillary refill takes less than 2 seconds. No rash noted. She is not diaphoretic. No erythema.  Psychiatric: She has a normal mood and affect.  Nursing note and vitals reviewed.    ED Treatments / Results  Labs (all labs ordered are listed, but only abnormal results are displayed) Labs Reviewed  CBC WITH DIFFERENTIAL/PLATELET - Abnormal; Notable for the following:       Result Value   WBC 20.5 (*)    RDW 21.5 (*)    Neutro Abs 17.9 (*)    Monocytes Absolute 1.6 (*)    All other components within normal limits  COMPREHENSIVE METABOLIC  PANEL - Abnormal; Notable for the following:    Sodium 132 (*)    CO2 16 (*)    Glucose, Bld 116 (*)    BUN 52 (*)    Creatinine, Ser 2.31 (*)    Albumin 3.1 (*)    Alkaline Phosphatase 177 (*)    GFR calc non Af Amer 19 (*)    GFR calc Af Amer 22 (*)    All other components within normal limits  LIPASE, BLOOD - Abnormal; Notable for the following:    Lipase 81 (*)    All other components within normal limits  URINALYSIS, ROUTINE W REFLEX MICROSCOPIC - Abnormal; Notable for the following:    APPearance CLOUDY (*)    Protein, ur 30 (*)    Leukocytes, UA TRACE (*)    All other components within normal limits  TROPONIN I - Abnormal; Notable for the following:    Troponin I 0.06 (*)    All other components within normal limits  URINALYSIS, MICROSCOPIC (REFLEX) - Abnormal; Notable for the following:    Bacteria, UA RARE (*)    Squamous Epithelial / LPF 0-5 (*)    All other components within normal limits  URINE CULTURE  PROTIME-INR  BRAIN NATRIURETIC PEPTIDE    EKG  EKG Interpretation None        Radiology Dg Chest 2 View  Result Date: 12/06/2016 CLINICAL DATA:  Shortness of breath and fatigue. History of colon carcinoma EXAM: CHEST  2 VIEW COMPARISON:  November 17, 2016 FINDINGS: Port-A-Cath tip is in the superior vena cava. No pneumothorax. There is no edema or consolidation. The heart size and pulmonary vascularity are normal. Aorta is tortuous but stable. No adenopathy evident. No bone lesions. IMPRESSION: Stable aortic tortuosity. No edema or consolidation. No adenopathy. Port-A-Cath unchanged in position. Electronically Signed   By: Lowella Grip III M.D.   On: 12/06/2016 15:41     ED ECG REPORT   Date: 12/06/2016  Rate: 83  Rhythm: normal sinus rhythm  QRS Axis: normal  Intervals: normal  ST/T Wave abnormalities: minimal ST elevations inferiorly  Conduction Disutrbances:none  Narrative Interpretation:   Old EKG Reviewed: none available  I have personally reviewed the EKG tracing and agree with the computerized printout as noted.     Procedures Procedures (including critical care time)  Medications Ordered in ED Medications  cefTRIAXone (ROCEPHIN) 1 g in dextrose 5 % 50 mL IVPB (not administered)  sodium chloride 0.9 % bolus 1,000 mL (0 mLs Intravenous Stopped 12/06/16 1644)     Initial Impression / Assessment and Plan / ED Course  I have reviewed the triage vital signs and the nursing notes.  Pertinent labs & imaging results that were available during my care of the patient were reviewed by me and considered in my medical decision making (see chart for details).     Melanie [name] is a 82 y.o. female with a past medical history significant for colon cancer status post partial colectomy with ostomy/splenectomy/left nephrectomy who was sent down from her oncology office for further workup of severe fatigue, chills, nausea, vomiting, decreased appetite, and shortness of breath with exertion. Patient is accompanied by family who reports that for the last 2  weeks, patient has had worsening symptoms. She has felt extremely fatigued. She says that 3 weeks ago she could walk normally but due to his ago began using a walker and this week cannot walk due to the severe fatigue. She has no appetite. She has been  unable to take her medications over the last 3 days due to nausea and vomiting. She is not tolerating food or fluids. She denies severe abdominal pain. She has minimal output from her ostomy with no bleeding. She denies any urinary symptoms. She denies any cough, congestion, or rhinorrhea. She denies any chest pain or palpitations, she just has the exertional shortness of breath.  The oncologist called from clinic saying that they were concerned about dehydration and a leukocytosis might represent infection. They started fluids prior to sending her down. Upon arrival, patient continued to be tachycardic. She denies any complaints other than fatigue. She continues to deny chest pain and while in bed, has no shortness of breath. She denies any new leg pain or leg swelling.  History and exam are seen above. On exam, lungs are clear. Chest is nontender. No CVA tenderness. Abdomen is nontender. Ostomy is in place with no significant erythema or tenderness around it. No frank blood in the ostomy. No extremity edema.  Based on symptoms, suspect dehydration the setting of decreased appetite and nausea with vomiting. Patient will be given more fluids as she is still tachycardic. Patient will have repeat blood testing and have workup to look for occult infection. With lack of chest pain or constant shortness of breath, will hold on CT imaging to look forward PE. Feel d-dimer would likely be positive with widespread cancer and would be not helpful.  Anticipate admission after diagnostic testing.   5:45 PM Diagnostic testing results show leukocytes and bacteria in the urine. In the setting of her chills, nausea, vomiting, and leukocytosis, patient will be treated for  UTI. Leukocytosis is worsening since several hours ago when he was 19. Is now 20.5. No anemia. He is also slightly elevated at 81. Troponin elevated at 0.06. BNP nonelevated. Chest x-ray shows no pneumonia. EKG showed some ST elevations but did not appear STEMI. Patient continues to deny chest pain or shortness of breath at this time.  Pulmonary embolus also considered giving cancer diagnoses however, in this absence of current chest pain or shortness of breath, will defer to admitting team for further workup.  Given the patient's symptoms with worsening fatigue, UTI, intolerance of by mouth preventing her taking home medicines, elevated troponin, and her worsening leukocytosis, patient will be admitted for inpatient management. [facility] will be called as this is the location the oncology team wanted her to be admitted.  Patient will be admitted.   Final Clinical Impressions(s) / ED Diagnoses   Final diagnoses:  Lower urinary tract infectious disease  SOB (shortness of breath)  Elevated troponin  Dehydration  Fatigue, unspecified type     Clinical Impression: 1. Lower urinary tract infectious disease   2. SOB (shortness of breath)   3. Elevated troponin   4. Dehydration   5. Fatigue, unspecified type     Disposition: Admit to Hospitalist service at [facility].     Roselyne Stalnaker, Gwenyth Allegra, MD 12/07/16 [phone]

## 2016-12-07 ENCOUNTER — Encounter (HOSPITAL_COMMUNITY): Payer: Self-pay

## 2016-12-07 ENCOUNTER — Ambulatory Visit: Payer: Federal, State, Local not specified - PPO

## 2016-12-07 DIAGNOSIS — R6510 Systemic inflammatory response syndrome (SIRS) of non-infectious origin without acute organ dysfunction: Secondary | ICD-10-CM

## 2016-12-07 DIAGNOSIS — N183 Chronic kidney disease, stage 3 unspecified: Secondary | ICD-10-CM | POA: Diagnosis present

## 2016-12-07 DIAGNOSIS — E860 Dehydration: Secondary | ICD-10-CM

## 2016-12-07 DIAGNOSIS — C186 Malignant neoplasm of descending colon: Secondary | ICD-10-CM

## 2016-12-07 DIAGNOSIS — R748 Abnormal levels of other serum enzymes: Secondary | ICD-10-CM

## 2016-12-07 DIAGNOSIS — R627 Adult failure to thrive: Secondary | ICD-10-CM

## 2016-12-07 DIAGNOSIS — R5383 Other fatigue: Secondary | ICD-10-CM

## 2016-12-07 LAB — CBC
HCT: 33.1 % — ABNORMAL LOW (ref 36.0–46.0)
Hemoglobin: 11.3 g/dL — ABNORMAL LOW (ref 12.0–15.0)
MCH: 28.9 pg (ref 26.0–34.0)
MCHC: 34.1 g/dL (ref 30.0–36.0)
MCV: 84.7 fL (ref 78.0–100.0)
Platelets: 210 10*3/uL (ref 150–400)
RBC: 3.91 MIL/uL (ref 3.87–5.11)
RDW: 20.8 % — ABNORMAL HIGH (ref 11.5–15.5)
WBC: 16.7 10*3/uL — ABNORMAL HIGH (ref 4.0–10.5)

## 2016-12-07 LAB — TROPONIN I
Troponin I: 0.06 ng/mL (ref <0.03)
Troponin I: 0.06 ng/mL (ref <0.03)

## 2016-12-07 LAB — BASIC METABOLIC PANEL
Anion gap: 10 (ref 5–15)
BUN: 44 mg/dL — ABNORMAL HIGH (ref 6–20)
CO2: 15 mmol/L — ABNORMAL LOW (ref 22–32)
Calcium: 8.8 mg/dL — ABNORMAL LOW (ref 8.9–10.3)
Chloride: 109 mmol/L (ref 101–111)
Creatinine, Ser: 1.97 mg/dL — ABNORMAL HIGH (ref 0.44–1.00)
GFR calc Af Amer: 27 mL/min — ABNORMAL LOW (ref >60)
GFR calc non Af Amer: 23 mL/min — ABNORMAL LOW (ref >60)
Glucose, Bld: 97 mg/dL (ref 65–99)
Potassium: 4 mmol/L (ref 3.5–5.1)
Sodium: 134 mmol/L — ABNORMAL LOW (ref 135–145)

## 2016-12-07 LAB — LACTIC ACID, PLASMA: Lactic Acid, Venous: 1.2 mmol/L (ref 0.5–1.9)

## 2016-12-07 LAB — URINE CULTURE

## 2016-12-07 LAB — CEA (IN HOUSE-CHCC): CEA (CHCC-In House): 5.87 ng/mL — ABNORMAL HIGH (ref 0.00–5.00)

## 2016-12-07 MED ORDER — SODIUM CHLORIDE 0.9% FLUSH
10.0000 mL | INTRAVENOUS | Status: DC | PRN
  Administered 2016-12-07 – 2016-12-10 (×4): 10 mL
  Filled 2016-12-07 (×4): qty 40

## 2016-12-07 MED ORDER — DEXTROSE 5 % IV SOLN
1.0000 g | INTRAVENOUS | Status: DC
  Administered 2016-12-07 – 2016-12-08 (×2): 1 g via INTRAVENOUS
  Filled 2016-12-07 (×3): qty 1

## 2016-12-07 NOTE — Progress Notes (Signed)
[facility] PROGRESS NOTE  Melanie [name] YNW:[id number] DOB: [birth year] DOA: 12/06/2016 PCP: Patient, No Pcp Per  Interim summary and HPI 82 y.o. female with medical history significant of colon cancer s/p colostomy; HTN; and hypothyroidism presenting with FTT.  Patient had surgery for the colorectal cancer on March 29 - she also required nephrectomy and splenectomy.  Her family reports a "great recovery".  Initially started chemo 5/30.  Did well on her first 2 rounds.  After the third round, she lost her appetite, had decreased PO intake, and has gradually become weaker and weaker.  She went 2 weeks ago for onc f/u - given IVF with some short-term improvement.  Over the last 2 weeks, she has had deteriorating energy, deteriorating fluid intake, and diminished appetite. Admitted for further work up of FTT.  Assessment/Plan: 1-SIRS: no source of infection appreciated yet -continue empiric antibiotic therapy for another 24-36 hours, while following culture data -will continue IVF's -patient with neg lactic acid, but with elevated procalcitonin   2-colon cancer -status post surgical resection and ostomy -actively receiving chemotherapy by Dr. Marin Olp -has developed FTT and overall with guarded prognosis  -once renal function improves, plan is to check liver MRI to assess for liver metastasis   3-acute on chronic renal failure: stage 3 at baseline -appears to be secondary to lack of proper hydration (pre-renal) -will continue IVF's -follow trend -minimize use of nephrotoxic agents and also avoid hypotensive state  4-HTN -BP currently soft -will stop antihypertensive regimen for now -will monitor VS  5-severe protein calorie malnutrition  -nutritional service consulted -will follow rec's regarding feeding supplements.  6-hypothyroidism -will continue levothyroxine   Code Status: Full Family Communication: sister at bedside Disposition Plan: to be determine. Patient w/o frank  signs of infection. Significantly dehydrated. For now will continue abx's, IVF's and supportive care. Oncology to assist dictating care. Once renal function improves will pursuit liver MRI to better assess potential metastatic lesions to the liver.   Consultants:  Oncology   Procedures:  See below for x-ray reports   Antibiotics:  Vancomycin and cefepime   HPI/Subjective: Tired and fatigued. Patient feeling weak. Endorses very poor appetite. No CP, no SOB, no nausea or vomiting.  Objective: Vitals:   12/07/16 1656 12/07/16 2105  BP: (!) 103/57 113/65  Pulse: 81 84  Resp:    Temp: 99.5 F (37.5 C) 98 F (36.7 C)    Intake/Output Summary (Last 24 hours) at 12/07/16 2320 Last data filed at 12/07/16 2015  Gross per 24 hour  Intake          1736.67 ml  Output              975 ml  Net           761.67 ml   Filed Weights   12/06/16 1349 12/06/16 1927  Weight: 60.3 kg (133 lb) 60.3 kg (132 lb 15 oz)    Exam:   General: afebrile, denying CP, no SOB, abd pain or dysuria. Patient is underweight, frail and chronically ill in appearance. Denies nausea and vomiting.  Cardiovascular: S1 and S2, no rubs, no gallops, no murmurs   Respiratory: good air movement, no wheezing, no crackles  Abdomen: soft, positive BS; ostomy present, liquid stools inside bag; no bloody discharges seen.  Musculoskeletal: no edema, no cyanosis  Data Reviewed: Basic Metabolic Panel:  Recent Labs Lab 12/06/16 1100 12/06/16 1541 12/07/16 0446  NA 129 132* 134*  K 4.1 4.2 4.0  CL 101 106  109  CO2 17* 16* 15*  GLUCOSE 164* 116* 97  BUN 52* 52* 44*  CREATININE 2.3* 2.31* 1.97*  CALCIUM 10.1 9.2 8.8*   Liver Function Tests:  Recent Labs Lab 12/06/16 1100 12/06/16 1541  AST 47* 41  ALT 23 17  ALKPHOS 191* 177*  BILITOT 0.60 0.3  PROT 7.8 7.2  ALBUMIN 3.1* 3.1*    Recent Labs Lab 12/06/16 1541  LIPASE 81*   No results for input(s): AMMONIA in the last 168  hours. CBC:  Recent Labs Lab 12/06/16 1100 12/06/16 1541 12/07/16 0446  WBC 17.0* 20.5* 16.7*  NEUTROABS 14.3* 17.9*  --   HGB 13.9 13.6 11.3*  HCT 39.9 37.9 33.1*  MCV 85 84.6 84.7  PLT 245 233 210   Cardiac Enzymes:  Recent Labs Lab 12/06/16 1512 12/06/16 2148 12/07/16 0446 12/07/16 0934  TROPONINI 0.06* 0.06* 0.06* 0.06*   BNP (last 3 results)  Recent Labs  12/06/16 1512  BNP 41.1   CBG: No results for input(s): GLUCAP in the last 168 hours.  Recent Results (from the past 240 hour(s))  Urine culture     Status: Abnormal   Collection Time: 12/06/16  4:54 PM  Result Value Ref Range Status   Specimen Description URINE, CLEAN CATCH  Final   Special Requests NONE  Final   Culture MULTIPLE ORGANISMS PRESENT, NONE PREDOMINANT (A)  Final   Report Status 12/07/2016 FINAL  Final  Blood culture (routine x 2)     Status: None (Preliminary result)   Collection Time: 12/06/16  6:08 PM  Result Value Ref Range Status   Specimen Description BLOOD RIGHT ANTECUBITAL  Final   Special Requests   Final    BOTTLES DRAWN AEROBIC AND ANAEROBIC Blood Culture adequate volume   Culture   Final    NO GROWTH < 12 HOURS Performed at [facility], [address], [place], [place] [address]    Report Status PENDING  Incomplete  Blood culture (routine x 2)     Status: None (Preliminary result)   Collection Time: 12/06/16  6:15 PM  Result Value Ref Range Status   Specimen Description BLOOD LEFT ANTECUBITAL  Final   Special Requests   Final    BOTTLES DRAWN AEROBIC AND ANAEROBIC Blood Culture adequate volume   Culture   Final    NO GROWTH < 12 HOURS Performed at [facility], [place] [address] [place], [place] [address]    Report Status PENDING  Incomplete     Studies: Dg Chest 2 View  Result Date: 12/06/2016 CLINICAL DATA:  Shortness of breath and fatigue. History of colon carcinoma EXAM: CHEST  2 VIEW COMPARISON:  November 17, 2016 FINDINGS: Port-A-Cath tip is in the  superior vena cava. No pneumothorax. There is no edema or consolidation. The heart size and pulmonary vascularity are normal. Aorta is tortuous but stable. No adenopathy evident. No bone lesions. IMPRESSION: Stable aortic tortuosity. No edema or consolidation. No adenopathy. Port-A-Cath unchanged in position. Electronically Signed   By: Lowella Grip III M.D.   On: 12/06/2016 15:41    Scheduled Meds: . aspirin EC  81 mg Oral Daily  . dronabinol  5 mg Oral BID AC  . enoxaparin (LOVENOX) injection  30 mg Subcutaneous Q24H  . levothyroxine  25 mcg Oral QAC breakfast  . mouth rinse  15 mL Mouth Rinse BID   Continuous Infusions: . sodium chloride 100 mL/hr at 12/07/16 2111  . ceFEPime (MAXIPIME) IV 1 g (12/07/16  1824)  Derrill Memo ON 12/08/2016] vancomycin      Principal Problem:   SIRS (systemic inflammatory response syndrome) (HCC) Active Problems:   Cancer of left colon (HCC)   Failure to thrive in adult   Chronic renal insufficiency, stage 3 (moderate)    Time spent: 30 minutes    Barton Dubois  [facility] Pager [phone]. If 7PM-7AM, please contact night-coverage at [email or web address], password [facility] 12/07/2016, 11:20 PM  LOS: 1 day

## 2016-12-07 NOTE — Progress Notes (Signed)
Pharmacy Antibiotic Note  Melanie [name] is a 82 y.o. female admitted on 12/06/2016 with sepsis.  Pharmacy has been consulted for vancomycin/Zosyn dosing.  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 FOLFOX treatment was stopped due to intolerance. She is now here today with c/o weakness, fatigue, loss of appetite, SOB with any exertion, n/v when taking her medication and left upper quadrant pain.    Plan:  Changing Zosyn to cefepime - start 1g IV q8  Continue current vanc dosing  Monitor clinical course, renal function, cultures as available       Height: 5\' 4"  (162.6 cm) Weight: 132 lb 15 oz (60.3 kg) IBW/kg (Calculated) : 54.7  Temp (24hrs), Avg:97.8 F (36.6 C), Min:97.2 F (36.2 C), Max:98.2 F (36.8 C)   Recent Labs Lab 12/06/16 1100 12/06/16 1541 12/06/16 2148 12/06/16 2358 12/07/16 0446  WBC 17.0* 20.5*  --   --  16.7*  CREATININE 2.3* 2.31*  --   --  1.97*  LATICACIDVEN  --   --  1.3 1.2  --     Estimated Creatinine Clearance: 20 mL/min (A) (by C-G formula based on SCr of 1.97 mg/dL (H)).    No Known Allergies  Antimicrobials this admission: 7/10 ceftriaxone x 1  7/10 Zosyn >>  7/10 vancomycin >>   Dose adjustments this admission: --  Microbiology results: 7/10 BCx: sent 7/10 UCx: sent    Thank you for allowing pharmacy to be a part of this patient's care.  Adrian Saran, PharmD, BCPS Pager [phone] 12/07/2016 1:51 PM

## 2016-12-07 NOTE — Progress Notes (Signed)
PT Cancellation Note  Patient Details Name: CASSANDRIA DREW MRN: [id number] DOB: [birth year]   Cancelled Treatment:     PT re-attempted but pt declines again 2* fatigue but states she will participate in am.  Will follow.   Prateek Knipple 12/07/2016, 3:22 PM

## 2016-12-07 NOTE — Progress Notes (Signed)
PT Cancellation Note  Patient Details Name: BIONCA MCKEY MRN: [id number] DOB: 02-09-1935   Cancelled Treatment:     PT order received but eval deferred at pt request 2* fatigue.  Will follow.   Meril Dray 12/07/2016, 10:51 AM

## 2016-12-07 NOTE — Progress Notes (Signed)
MRI called to inform RN that they are unable to perform MRI d/t pt's creatinine level. Recommended to hydrate pt in order to get her kidney function under control and perform MRI as outpatient procedure. NP on call, Baltazar Najjar, informed regarding recommendations.

## 2016-12-07 NOTE — Progress Notes (Signed)
I very, very, very much appreciate all the help that we are getting to try to get Melanie [name] better. She was admitted yesterday from the office.  Cultures are pending. She is on IV antibiotics. I think this is a great idea.  She is still not eating all that much. Hopefully, this will improve.  I still worry about infection because of her spleen being removed.  She is set up for an MRI which is to be done until her renal function is better.  Her labs today show that her white cell count is down to 16.7. Hemoglobin 11.3. Her creatinine is 1.97. BUN is 44.  Her TSH was checked and it is normal.  She was clearly dehydrated. She is on IV fluids.   On her physical exam, she is afebrile. Her blood pressure is 96/67. Her lungs sound pretty clear. Cardiac exam regular rate and rhythm with no murmurs, rubs or bruits. Abdomen is soft. There are slightly decreased bowel sounds. There is no abdominal mass area and she has a laparotomy scars that are healed. Extremities shows some trace edema. She has some muscle atrophy. Neurological exam is nonfocal.  For right now, I'm still not sure as to why she is doing so poorly. His been a month since she had any treatment. She's only had 2 or 3 cycles of treatment. Typically, the FOLFOX that we use is very well-tolerated and not to "abusive" on the body or immune system.  Hopefully, she will feel better with antibiotics. We will have see what the cultures show.  I will be out the rest of the week. If there are any issues, one of my partners will be able to help [place] out.  Again, I am very much in awe of the wonderful care that Melanie [name] is receiving.!!!!  Lattie Haw, MD  Psalm 118:6-7

## 2016-12-08 DIAGNOSIS — N179 Acute kidney failure, unspecified: Principal | ICD-10-CM

## 2016-12-08 DIAGNOSIS — E43 Unspecified severe protein-calorie malnutrition: Secondary | ICD-10-CM | POA: Insufficient documentation

## 2016-12-08 LAB — CREATININE, SERUM
Creatinine, Ser: 1.8 mg/dL — ABNORMAL HIGH (ref 0.44–1.00)
GFR calc Af Amer: 30 mL/min — ABNORMAL LOW (ref >60)
GFR calc non Af Amer: 26 mL/min — ABNORMAL LOW (ref >60)

## 2016-12-08 MED ORDER — ENSURE ENLIVE PO LIQD
237.0000 mL | Freq: Two times a day (BID) | ORAL | Status: DC
  Administered 2016-12-09 – 2016-12-10 (×2): 237 mL via ORAL

## 2016-12-08 NOTE — Progress Notes (Signed)
Physical Therapy Evaluation Patient Details Name: Melanie [name] MRN: [id number] DOB: Apr 27, 1935 Today's Date: 12/08/2016   History of Present Illness  Pt is a 82 y.o. female with medical history significant of colon cancer s/p colostomy; HTN; and hypothyroidism presenting with FTT  Clinical Impression  Patient evaluated by PT with no further acute PT needs identified. All education has been completed and the patient has no further questions. See below for any follow-up PT or equipment needs. PT is signing off. Thank you for this referral. Pt responded well to treatment and would benefit from home PT at this time.     Follow Up Recommendations Home health PT    Equipment Recommendations  None recommended by PT    Recommendations for Other Services       Precautions / Restrictions Precautions Precautions: Fall Precaution Comments: colostomy Restrictions Weight Bearing Restrictions: No      Mobility  Bed Mobility               General bed mobility comments: Pt was oob upon PT arrival  Transfers Overall transfer level: Needs assistance Equipment used: Rolling walker (2 wheeled) Transfers: Sit to/from Stand Sit to Stand: Supervision         General transfer comment: Supervision was provided for safety  Ambulation/Gait Ambulation/Gait assistance: Supervision Ambulation Distance (Feet): 120 Feet Assistive device: Rolling walker (2 wheeled) Gait Pattern/deviations: Step-through pattern;Decreased stride length Gait velocity: decr   General Gait Details: Pt responded well to treatment and was able to ambulate with minimal assistance for safety  Stairs            Wheelchair Mobility    Modified Rankin (Stroke Patients Only)       Balance Overall balance assessment: Needs assistance         Standing balance support: Bilateral upper extremity supported Standing balance-Leahy Scale: Fair Standing balance comment: Pt requires bilateral UE support in  the form of a RW to complete activities while standing                              Pertinent Vitals/Pain Pain Assessment: No/denies pain    Home Living Family/patient expects to be discharged to:: Private residence Living Arrangements: Other relatives Available Help at Discharge: Family;Available 24 hours/day Type of Home: House Home Access: Stairs to enter Entrance Stairs-Rails: Right Entrance Stairs-Number of Steps: 1 Home Layout: One level Home Equipment: Walker - 2 wheels;Bedside commode      Prior Function Level of Independence: Needs assistance   Gait / Transfers Assistance Needed: Pt uses a RW while ambulating           Hand Dominance        Extremity/Trunk Assessment   Upper Extremity Assessment Upper Extremity Assessment: Overall WFL for tasks assessed    Lower Extremity Assessment Lower Extremity Assessment: Generalized weakness       Communication   Communication: No difficulties  Cognition Arousal/Alertness: Awake/alert Behavior During Therapy: WFL for tasks assessed/performed Overall Cognitive Status: Within Functional Limits for tasks assessed                                        General Comments      Exercises     Assessment/Plan    PT Assessment All further PT needs can be met in the next venue of care  PT  Problem List Decreased strength;Decreased mobility;Decreased safety awareness;Decreased range of motion;Decreased activity tolerance;Decreased balance;Decreased knowledge of use of DME       PT Treatment Interventions      PT Goals (Current goals can be found in the Care Plan section)  Acute Rehab PT Goals PT Goal Formulation: All assessment and education complete, DC therapy    Frequency     Barriers to discharge        Co-evaluation               AM-PAC PT "6 Clicks" Daily Activity  Outcome Measure Difficulty turning over in bed (including adjusting bedclothes, sheets and  blankets)?: None Difficulty moving from lying on back to sitting on the side of the bed? : None Difficulty sitting down on and standing up from a chair with arms (e.g., wheelchair, bedside commode, etc,.)?: None Help needed moving to and from a bed to chair (including a wheelchair)?: A Little Help needed walking in hospital room?: A Little Help needed climbing 3-5 steps with a railing? : A Little 6 Click Score: 21    End of Session Equipment Utilized During Treatment: Gait belt Activity Tolerance: Patient tolerated treatment well Patient left: in chair;with call bell/phone within reach   PT Visit Diagnosis: Difficulty in walking, not elsewhere classified (R26.2)    Time: 5790-0920 PT Time Calculation (min) (ACUTE ONLY): 10 min   Charges:   PT Evaluation $PT Eval Low Complexity: 1 Procedure     PT G CodesOlegario Shearer, SPT  Reino Bellis 12/08/2016, 4:15 PM

## 2016-12-08 NOTE — Progress Notes (Signed)
[facility] PROGRESS NOTE  CAMERAN AHMED SKA:[id number] DOB: 12-16-1934 DOA: 12/06/2016 PCP: Patient, No Pcp Per  Interim summary and HPI 82 y.o. female with medical history significant of colon cancer s/p colostomy; HTN; and hypothyroidism presenting with FTT.  Patient had surgery for the colorectal cancer on March 29 - she also required nephrectomy and splenectomy.  Her family reports a "great recovery".  Initially started chemo 5/30.  Did well on her first 2 rounds.  After the third round, she lost her appetite, had decreased PO intake, and has gradually become weaker and weaker.  She went 2 weeks ago for onc f/u - given IVF with some short-term improvement.  Over the last 2 weeks, she has had deteriorating energy, deteriorating fluid intake, and diminished appetite. Admitted for further work up of FTT.  Assessment/Plan: 1-SIRS: no source of infection appreciated yet -continue empiric antibiotic therapy for another 24 hours, while following culture data -given neg MRSA screening will stop vancomycin  -patient with neg lactic acid, but with elevated procalcitonin and impaired immune system from chemotherapy. -will continue supportive care  2-colon cancer -status post surgical resection and ostomy -actively receiving chemotherapy by Dr. Marin Olp -has developed FTT and overall with guarded prognosis  -once renal function improves, plan is to check liver MRI to assess for liver metastasis  -will plan for MRI of liver in am if renal function remains stable/continue improving   3-acute on chronic renal failure: stage 3 at baseline -appears to be secondary to lack of proper hydration (pre-renal in nature) -continue IVF's -renal function improving appropriately -will follow trend -avoid hypotension  4-HTN -BP stable now -will continue monitoring off meds -follow VS  5-severe protein calorie malnutrition  -will follow nutritional service rec's -continue feeding  supplements.  6-hypothyroidism -will continue synthroid   Code Status: Full Family Communication: sister at bedside Disposition Plan: to be determine. Patient w/o frank signs of infection. Significantly dehydrated. For now will continue abx's, IVF's and supportive care. Oncology to assist dictating care. Once renal function improves will pursuit liver MRI to better assess potential metastatic lesions to the liver.   Consultants:  Oncology   Procedures:  See below for x-ray reports   Antibiotics:  Vancomycin 7/10>>7/12  Cefepime 7/11  HPI/Subjective: Afebrile, no CP, no SOB and no abd pain. No nausea, no vomiting.  Objective: Vitals:   12/08/16 0540 12/08/16 1423  BP: 121/75 133/70  Pulse: 80 83  Resp: 18 20  Temp: 97.9 F (36.6 C) 97.9 F (36.6 C)    Intake/Output Summary (Last 24 hours) at 12/08/16 1953 Last data filed at 12/08/16 1610  Gross per 24 hour  Intake             1160 ml  Output              900 ml  Net              260 ml   Filed Weights   12/06/16 1349 12/06/16 1927  Weight: 60.3 kg (133 lb) 60.3 kg (132 lb 15 oz)    Exam:   General: afebrile, no CP, no SOB, no abd pain, no dysuria. Patient is frail, underweight and chronically ill in appearance. No nausea, no vomiting.  Cardiovascular: S1 and S2, no rubs, no gallops, no murmurs    Respiratory: good air movement, no wheezing, no crackles.   Abdomen: soft, NT, ND, positive BS; liquid stools inside bag.  Musculoskeletal: no edema, no cyanosis, no clubbing   Data Reviewed:  Basic Metabolic Panel:  Recent Labs Lab 12/06/16 1100 12/06/16 1541 12/07/16 0446 12/08/16 0309  NA 129 132* 134*  --   K 4.1 4.2 4.0  --   CL 101 106 109  --   CO2 17* 16* 15*  --   GLUCOSE 164* 116* 97  --   BUN 52* 52* 44*  --   CREATININE 2.3* 2.31* 1.97* 1.80*  CALCIUM 10.1 9.2 8.8*  --    Liver Function Tests:  Recent Labs Lab 12/06/16 1100 12/06/16 1541  AST 47* 41  ALT 23 17  ALKPHOS 191*  177*  BILITOT 0.60 0.3  PROT 7.8 7.2  ALBUMIN 3.1* 3.1*    Recent Labs Lab 12/06/16 1541  LIPASE 81*   CBC:  Recent Labs Lab 12/06/16 1100 12/06/16 1541 12/07/16 0446  WBC 17.0* 20.5* 16.7*  NEUTROABS 14.3* 17.9*  --   HGB 13.9 13.6 11.3*  HCT 39.9 37.9 33.1*  MCV 85 84.6 84.7  PLT 245 233 210   Cardiac Enzymes:  Recent Labs Lab 12/06/16 1512 12/06/16 2148 12/07/16 0446 12/07/16 0934  TROPONINI 0.06* 0.06* 0.06* 0.06*   BNP (last 3 results)  Recent Labs  12/06/16 1512  BNP 41.1   CBG: No results for input(s): GLUCAP in the last 168 hours.  Recent Results (from the past 240 hour(s))  Urine culture     Status: Abnormal   Collection Time: 12/06/16  4:54 PM  Result Value Ref Range Status   Specimen Description URINE, CLEAN CATCH  Final   Special Requests NONE  Final   Culture MULTIPLE ORGANISMS PRESENT, NONE PREDOMINANT (A)  Final   Report Status 12/07/2016 FINAL  Final  Blood culture (routine x 2)     Status: None (Preliminary result)   Collection Time: 12/06/16  6:08 PM  Result Value Ref Range Status   Specimen Description BLOOD RIGHT ANTECUBITAL  Final   Special Requests   Final    BOTTLES DRAWN AEROBIC AND ANAEROBIC Blood Culture adequate volume   Culture   Final    NO GROWTH 2 DAYS Performed at [facility], [address] [place], [place] [address]    Report Status PENDING  Incomplete  Blood culture (routine x 2)     Status: None (Preliminary result)   Collection Time: 12/06/16  6:15 PM  Result Value Ref Range Status   Specimen Description BLOOD LEFT ANTECUBITAL  Final   Special Requests   Final    BOTTLES DRAWN AEROBIC AND ANAEROBIC Blood Culture adequate volume   Culture   Final    NO GROWTH 2 DAYS Performed at [facility], [place] [address], [place], [place] [address]    Report Status PENDING  Incomplete     Studies: No results found.  Scheduled Meds: . aspirin EC  81 mg Oral Daily  . dronabinol  5 mg Oral BID AC  .  enoxaparin (LOVENOX) injection  30 mg Subcutaneous Q24H  . feeding supplement (ENSURE ENLIVE)  237 mL Oral BID BM  . levothyroxine  25 mcg Oral QAC breakfast  . mouth rinse  15 mL Mouth Rinse BID   Continuous Infusions: . sodium chloride 100 mL/hr at 12/08/16 0545  . ceFEPime (MAXIPIME) IV 1 g (12/08/16 1805)    Principal Problem:   SIRS (systemic inflammatory response syndrome) (HCC) Active Problems:   Cancer of left colon (HCC)   Failure to thrive in adult   Chronic renal insufficiency, stage 3 (moderate)   Protein-calorie malnutrition, severe  Time spent: 30 minutes    Barton Dubois  [facility] Pager [phone]. If 7PM-7AM, please contact night-coverage at [email or web address], password [facility] 12/08/2016, 7:53 PM  LOS: 2 days

## 2016-12-08 NOTE — Evaluation (Signed)
Occupational Therapy Evaluation and Discharge Patient Details Name: Melanie [name] MRN: [id number] DOB: 12/25/1934 Today's Date: 12/08/2016    History of Present Illness Melanie [name] is a 82 y.o. female with medical history significant of colon cancer s/p colostomy; HTN; and hypothyroidism presenting with FTT   Clinical Impression   This 81 yo female admitted for above presents to acute OT at a overall S level from RW level and she will have this at home,no further OT needs we will sign off.    Follow Up Recommendations  No OT follow up;Supervision/Assistance - 24 hour    Equipment Recommendations  None recommended by OT       Precautions / Restrictions Precautions Precautions: Fall Precaution Comments: colostomy Restrictions Weight Bearing Restrictions: No      Mobility Bed Mobility Overal bed mobility: Modified Independent                Transfers Overall transfer level: Needs assistance Equipment used: Rolling walker (2 wheeled) Transfers: Sit to/from Stand Sit to Stand: Supervision              Balance Overall balance assessment: Needs assistance Sitting-balance support: No upper extremity supported;Feet supported Sitting balance-Leahy Scale: Good     Standing balance support: No upper extremity supported;During functional activity Standing balance-Leahy Scale: Fair Standing balance comment: standing at sink to wash face, did use RW to get to sink                           ADL either performed or assessed with clinical judgement   ADL                                         General ADL Comments: Overall at a S level due to generalized weakness     Vision Baseline Vision/History: Wears glasses Wears Glasses: Reading only Patient Visual Report: No change from baseline              Pertinent Vitals/Pain Pain Assessment: No/denies pain     Hand Dominance Right   Extremity/Trunk Assessment Upper Extremity  Assessment Upper Extremity Assessment: Overall WFL for tasks assessed   Lower Extremity Assessment Lower Extremity Assessment: Overall WFL for tasks assessed       Communication Communication Communication: No difficulties   Cognition Arousal/Alertness: Awake/alert Behavior During Therapy: WFL for tasks assessed/performed Overall Cognitive Status: Within Functional Limits for tasks assessed                                                Home Living Family/patient expects to be discharged to:: Private residence Living Arrangements: Other relatives (sister) Available Help at Discharge: Family;Available 24 hours/day Type of Home: House Home Access: Stairs to enter [employer] of Steps: 1 Entrance Stairs-Rails: Right Home Layout: One level     Bathroom Shower/Tub: Tub/shower unit;Curtain   Biochemist, clinical: Standard     Home Equipment: Environmental consultant - 2 wheels;Shower seat;Bedside commode;Hand held shower head          Prior Functioning/Environment Level of Independence: Needs assistance  Gait / Transfers Assistance Needed: has has to use a RW recently ADL's / Homemaking Assistance Needed: A with in and out of tub and IADLs  OT Problem List: Decreased strength            OT Goals(Current goals can be found in the care plan section) Acute Rehab OT Goals Patient Stated Goal: to feel better and get back home  OT Frequency:                AM-PAC PT "6 Clicks" Daily Activity     Outcome Measure Help from another person eating meals?: None Help from another person taking care of personal grooming?: None Help from another person toileting, which includes using toliet, bedpan, or urinal?: A Little Help from another person bathing (including washing, rinsing, drying)?: A Little Help from another person to put on and taking off regular upper body clothing?: None Help from another person to put on and taking off regular lower body  clothing?: A Little 6 Click Score: 21   End of Session Equipment Utilized During Treatment: Rolling walker  Activity Tolerance: Patient tolerated treatment well Patient left: in chair;with call bell/phone within reach;with chair alarm set  OT Visit Diagnosis: Unsteadiness on feet (R26.81)                Time: 5110-2111 OT Time Calculation (min): 21 min Charges:  OT General Charges $OT Visit: 1 Procedure OT Evaluation $OT Eval Moderate Complexity: 1 Procedure Golden Circle, OTR/L [phone] 12/08/2016

## 2016-12-08 NOTE — Progress Notes (Signed)
Initial Nutrition Assessment  DOCUMENTATION CODES:   Severe malnutrition in context of acute illness/injury  INTERVENTION:  - Will order Ensure Enlive BID, each supplement provides 350 kcal and 20 grams of protein - Continue to encourage PO intakes of meals and supplements.  NUTRITION DIAGNOSIS:   Malnutrition (severe) related to catabolic illness, cancer and cancer related treatments, acute illness, chronic illness as evidenced by mild depletion of body fat, moderate depletions of muscle mass, energy intake < or equal to 50% for > or equal to 5 days, percent weight loss.  GOAL:   Patient will meet greater than or equal to 90% of their needs  MONITOR:   PO intake, Supplement acceptance, Weight trends, Labs  REASON FOR ASSESSMENT:   Malnutrition Screening Tool, Consult  (FTT)  ASSESSMENT:   82 y.o. female with medical history significant of colon cancer s/p colostomy; HTN; and hypothyroidism presenting with FTT.  Patient had surgery for the colorectal cancer on March 29 - she also required nephrectomy and splenectomy.  Her family reports a "great recovery".  Initially started chemo 5/30.  Did well on her first 2 rounds.  After the third round, she lost her appetite, had decreased PO intake, and has gradually become weaker and weaker.  She went 2 weeks ago for onc f/u - given IVF with some short-term improvement.  Over the last 2 weeks, she has had deteriorating energy, deteriorating fluid intake, and diminished appetite.  They stopped chemotherapy as a result.  Severe left-sided abdominal/flank pain.  When her sister would give her medication she would throw it up - as a result, she has had no meds for the last 3 days.   Pt seen for consult and MST. BMI indicates normal weight. No intakes documented since admission. Pt reports ~75% completion of breakfast which consisted of bacon, pancakes, and a hard boiled egg and ~50% of lunch which was broccoli and a chicken salad sandwich. Pt  requested dinner be ordered to arrive at 5PM today: pot roast with gravy, macaroni and cheese, broccoli, an orange (current diet order override by RD for this item), sweet tea, and bottled water. Pt states that since admission appetite has greatly improved and she is beginning to have more energy.  She confirms poor appetite and intakes PTA which started at the beginning of the month following 3rd round of chemo. She was drinking Boost supplements (strawberry flavor) because of decrease in appetite but states "I got to a point where I wasn't eating or drinking anything, not even the Boost." She denies N/V or abdominal pain any time PTA or since admission. She denies any chewing or swallowing difficulties.   Physical assessment shows mild fat wasting to upper arm and moderate muscle wasting around clavicle area, shoulder, and legs. Per chart review, pt has lost 23 lbs (15% body weight) in the past 2 months (since 09/30/16). Dr. Anson Fret note from yesterday states that pt was severely dehydrated on admission so will need to monitor weight trends closely.  Notes indicate plan to assess for possible liver lesions/liver mets.    Medications reviewed; 5 mg Marinol BID, 25 mcg oral Synthroid/day. Labs reviewed; Na: 134 mmol/L, BUN: 44 mg/dL, creatinine: 1.8 mg/dL, GFR: 30 mL/min.   IVF: NS 2 100 mL/hr.       Diet Order:  DIET SOFT Room service appropriate? Yes; Fluid consistency: Thin  Skin:  Reviewed, no issues  Last BM:  7/12 via colostomy  Height:   Ht Readings from Last 1 Encounters:  12/06/16  5\' 4"  (1.626 m)    Weight:   Wt Readings from Last 1 Encounters:  12/06/16 132 lb 15 oz (60.3 kg)    Ideal Body Weight:  54.54 kg  BMI:  Body mass index is 22.82 kg/m.  Estimated Nutritional Needs:   Kcal:  1810-2110 (30-35 kcal/kg)  Protein:  85-97 grams (1.4-1.6 grams/kg)  Fluid:  >/= 2 L/day  EDUCATION NEEDS:   No education needs identified at this time    Jarome Matin,  MS, RD, LDN, CNSC Inpatient Clinical Dietitian Pager # [phone] After hours/weekend pager # [phone]

## 2016-12-08 NOTE — Care Management Note (Signed)
Case Management Note  Patient Details  Name: Melanie [name] MRN: [id number] Date of Birth: [birth year]  Subjective/Objective:81 y/o f admitted w/SIRS. From home w/family. Hx: Colon Ca, colostomy. Oncology following. PT cons-await recc.                   Action/Plan:d/c home.   Expected Discharge Date:                  Expected Discharge Plan:  Home/Self Care  In-House Referral:     Discharge planning Services  CM Consult  Post Acute Care Choice:    Choice offered to:     DME Arranged:    DME Agency:     HH Arranged:    HH Agency:     Status of Service:  In process, will continue to follow  If discussed at Long Length of Stay Meetings, dates discussed:    Additional Comments:  Dessa Phi, RN 12/08/2016, 3:53 PM

## 2016-12-09 ENCOUNTER — Inpatient Hospital Stay (HOSPITAL_COMMUNITY): Payer: Medicare Other

## 2016-12-09 DIAGNOSIS — K769 Liver disease, unspecified: Secondary | ICD-10-CM

## 2016-12-09 LAB — CBC
HCT: 26.9 % — ABNORMAL LOW (ref 36.0–46.0)
Hemoglobin: 9.1 g/dL — ABNORMAL LOW (ref 12.0–15.0)
MCH: 28.5 pg (ref 26.0–34.0)
MCHC: 33.8 g/dL (ref 30.0–36.0)
MCV: 84.3 fL (ref 78.0–100.0)
Platelets: 180 10*3/uL (ref 150–400)
RBC: 3.19 MIL/uL — ABNORMAL LOW (ref 3.87–5.11)
RDW: 21.6 % — ABNORMAL HIGH (ref 11.5–15.5)
WBC: 9.6 10*3/uL (ref 4.0–10.5)

## 2016-12-09 LAB — BASIC METABOLIC PANEL
Anion gap: 6 (ref 5–15)
BUN: 22 mg/dL — ABNORMAL HIGH (ref 6–20)
CO2: 15 mmol/L — ABNORMAL LOW (ref 22–32)
Calcium: 8 mg/dL — ABNORMAL LOW (ref 8.9–10.3)
Chloride: 117 mmol/L — ABNORMAL HIGH (ref 101–111)
Creatinine, Ser: 1.39 mg/dL — ABNORMAL HIGH (ref 0.44–1.00)
GFR calc Af Amer: 41 mL/min — ABNORMAL LOW (ref >60)
GFR calc non Af Amer: 35 mL/min — ABNORMAL LOW (ref >60)
Glucose, Bld: 72 mg/dL (ref 65–99)
Potassium: 3.4 mmol/L — ABNORMAL LOW (ref 3.5–5.1)
Sodium: 138 mmol/L (ref 135–145)

## 2016-12-09 MED ORDER — GADOBENATE DIMEGLUMINE 529 MG/ML IV SOLN
10.0000 mL | Freq: Once | INTRAVENOUS | Status: AC | PRN
  Administered 2016-12-09: 7 mL via INTRAVENOUS

## 2016-12-09 NOTE — Care Management Note (Signed)
Case Management Note  Patient Details  Name: Melanie [name] MRN: [id number] Date of Birth: 03-Oct-1934  Subjective/Objective:PT recc [place]. Spoke to dtr in rm about HHC-provided w/HHC agency list-used [facility] in [place] to use again-rep Tim aware & able to accept,aware of d/c in am,ordered HHPT. Patient already has rw,3n1. Patient will stay w/dtr [facility] [address] [place] [address]. pcp-Dr. Calton Golds [place] @ [facility]  No further CM needs.                  Action/Plan:d/c home w/HHC.   Expected Discharge Date:                  Expected Discharge Plan:  Home/Self Care  In-House Referral:     Discharge planning Services  CM Consult  Post Acute Care Choice:    Choice offered to:  Adult Children  DME Arranged:    DME Agency:     HH Arranged:  PT HH Agency:     Status of Service:  Completed, signed off  If discussed at [employer] of Stay Meetings, dates discussed:    Additional Comments:  Dessa Phi, RN 12/09/2016, 1:39 PM

## 2016-12-09 NOTE — Progress Notes (Signed)
[facility] PROGRESS NOTE  KOLLINS FENTER STM:[id number] DOB: 1935/04/15 DOA: 12/06/2016 PCP: Patient, No Pcp Per  Interim summary and HPI 82 y.o. female with medical history significant of colon cancer s/p colostomy; HTN; and hypothyroidism presenting with FTT.  Patient had surgery for the colorectal cancer on March 29 - she also required nephrectomy and splenectomy.  Her family reports a "great recovery".  Initially started chemo 5/30.  Did well on her first 2 rounds.  After the third round, she lost her appetite, had decreased PO intake, and has gradually become weaker and weaker.  She went 2 weeks ago for onc f/u - given IVF with some short-term improvement.  Over the last 2 weeks, she has had deteriorating energy, deteriorating fluid intake, and diminished appetite. Admitted for further work up of FTT.  Assessment/Plan: 1-SIRS: no source of infection appreciated yet -has remained asymptomatic and her SIRS features has resolved -cultures remains neg and w/o growth  -will monitor off antibiotics -patient with neg lactic acid -will continue supportive care  2-colon cancer -status post surgical resection and ostomy -actively receiving chemotherapy by Dr. Marin Olp -has developed FTT and overall with guarded prognosis  -after renal improvement; MRI of liver has been done and patient had metastatic lesions appreciated. -will repeat CEA level -might need biopsy if plans is for further invasive therapy. Will follow oncology rec's  3-acute on chronic renal failure: stage 3 at baseline -appears to be secondary to lack of proper hydration (pre-renal in nature) -will continue IVF's, but will adjust rate -renal function continue improving; will follow trend  -minimize nephrotoxic agents and avoid hypotension  4-HTN -BP stable now -will continue monitoring off meds -follow VS and resume home meds slowly as needed   5-severe protein calorie malnutrition  -will follow nutritional service  rec's -continue feeding supplements.  6-hypothyroidism -will continue synthroid   Code Status: Full Family Communication: sister at bedside Disposition Plan: to be determine. Patient w/o frank signs of infection. Significantly dehydrated on admisison. Will stop antibiotics and observed. No acute complaints. Renal function improved and MRI demonstrating liver mets. Will follow oncology rec's.   Consultants:  Oncology   Procedures:  See below for x-ray reports   Antibiotics:  Vancomycin 7/10>>7/12  Cefepime 7/11  HPI/Subjective: Afebrile, no CP, no SOB, no nausea, no vomiting.   Objective: Vitals:   12/09/16 0456 12/09/16 1423  BP: 117/64 127/62  Pulse: 78 74  Resp: 16 18  Temp: 98.5 F (36.9 C) 98.6 F (37 C)    Intake/Output Summary (Last 24 hours) at 12/09/16 1509 Last data filed at 12/09/16 1300  Gross per 24 hour  Intake             2540 ml  Output             1300 ml  Net             1240 ml   Filed Weights   12/06/16 1349 12/06/16 1927  Weight: 60.3 kg (133 lb) 60.3 kg (132 lb 15 oz)    Exam:   General: afebrile, no CP, no SOB, no abd pain, no nausea, no vomiting and no dysuria. still with poor appetite.   Cardiovascular: S1 and S2, no rubs, no gallops, no murmurs    Respiratory: good air movement, no wheezing, no crackles    Abdomen: soft, NT, ND, positive BS; stools seen in ostomy bag.  Musculoskeletal: no edema, no cyanosis or clubbing    Data Reviewed: Basic Metabolic Panel:  Recent Labs Lab 12/06/16 1100 12/06/16 1541 12/07/16 0446 12/08/16 0309 12/09/16 0447  NA 129 132* 134*  --  138  K 4.1 4.2 4.0  --  3.4*  CL 101 106 109  --  117*  CO2 17* 16* 15*  --  15*  GLUCOSE 164* 116* 97  --  72  BUN 52* 52* 44*  --  22*  CREATININE 2.3* 2.31* 1.97* 1.80* 1.39*  CALCIUM 10.1 9.2 8.8*  --  8.0*   Liver Function Tests:  Recent Labs Lab 12/06/16 1100 12/06/16 1541  AST 47* 41  ALT 23 17  ALKPHOS 191* 177*  BILITOT 0.60 0.3   PROT 7.8 7.2  ALBUMIN 3.1* 3.1*    Recent Labs Lab 12/06/16 1541  LIPASE 81*   CBC:  Recent Labs Lab 12/06/16 1100 12/06/16 1541 12/07/16 0446 12/09/16 0447  WBC 17.0* 20.5* 16.7* 9.6  NEUTROABS 14.3* 17.9*  --   --   HGB 13.9 13.6 11.3* 9.1*  HCT 39.9 37.9 33.1* 26.9*  MCV 85 84.6 84.7 84.3  PLT 245 233 210 180   Cardiac Enzymes:  Recent Labs Lab 12/06/16 1512 12/06/16 2148 12/07/16 0446 12/07/16 0934  TROPONINI 0.06* 0.06* 0.06* 0.06*   BNP (last 3 results)  Recent Labs  12/06/16 1512  BNP 41.1   CBG: No results for input(s): GLUCAP in the last 168 hours.  Recent Results (from the past 240 hour(s))  Urine culture     Status: Abnormal   Collection Time: 12/06/16  4:54 PM  Result Value Ref Range Status   Specimen Description URINE, CLEAN CATCH  Final   Special Requests NONE  Final   Culture MULTIPLE ORGANISMS PRESENT, NONE PREDOMINANT (A)  Final   Report Status 12/07/2016 FINAL  Final  Blood culture (routine x 2)     Status: None (Preliminary result)   Collection Time: 12/06/16  6:08 PM  Result Value Ref Range Status   Specimen Description BLOOD RIGHT ANTECUBITAL  Final   Special Requests   Final    BOTTLES DRAWN AEROBIC AND ANAEROBIC Blood Culture adequate volume   Culture   Final    NO GROWTH 3 DAYS Performed at [facility], [address] [place], [place] [address]    Report Status PENDING  Incomplete  Blood culture (routine x 2)     Status: None (Preliminary result)   Collection Time: 12/06/16  6:15 PM  Result Value Ref Range Status   Specimen Description BLOOD LEFT ANTECUBITAL  Final   Special Requests   Final    BOTTLES DRAWN AEROBIC AND ANAEROBIC Blood Culture adequate volume   Culture   Final    NO GROWTH 3 DAYS Performed at [facility], [place] [address] [place], [place] [address]    Report Status PENDING  Incomplete     Studies: Mr Abdomen W Wo Contrast  Result Date: 12/09/2016 CLINICAL DATA:  Lower abdominal  pain, 10 pound weight loss, undergoing chemotherapy for sigmoid colon adenocarcinoma. Prior left nephrectomy. Follow-up liver lesion on CT abdomen/pelvis. EXAM: MRI ABDOMEN WITHOUT AND WITH CONTRAST TECHNIQUE: Multiplanar multisequence MR imaging of the abdomen was performed both before and after the administration of intravenous contrast. CONTRAST:  6mL MULTIHANCE GADOBENATE DIMEGLUMINE 529 MG/ML IV SOLN COMPARISON:  CT abdomen/pelvis dated 11/22/2016 FINDINGS: Lower chest: Mild eventration of the left hemidiaphragm. Lung bases are otherwise clear. Hepatobiliary: 2.1 cm cyst in segment 3 (series 3/ image 22). 16 mm lesion in segment 8 (series 902/image 21). 8  mm lesion or in segment 4A (series 902/image 27). Both lesions are centrally hypoenhancing but appeared to demonstrate a region of enhancement on postcontrast subtraction imaging. Given the clinical history, these are worrisome for metastases. Gallbladder is unremarkable. No intrahepatic or extrahepatic ductal dilatation. Pancreas:  Within normal limits. Spleen: Surgically absent. Enhancing soft tissue in the left upper abdomen likely reflects residual splenic tissue (series 903/ image 34). Adjacent fluid collections. Adrenals/Urinary Tract:  Adrenal glands are within normal limits. Status post left nephrectomy. 11 mm medial right lower pole renal cyst (series 7/ image 33). Right kidney is otherwise within normal limits. No hydronephrosis. Stomach/Bowel: Inverted stomach beneath the elevated hemidiaphragm in the left upper abdomen. Visualized bowel is notable for a double-barrel ostomy in the anterior lower abdomen (series 7/ image 48), incompletely visualized. Vascular/Lymphatic:  No evidence of abdominal aortic aneurysm. No suspicious abdominal lymphadenopathy. Other:  No abdominal ascites. Musculoskeletal: No focal osseous lesions. IMPRESSION: 16 mm lesion in segment 8 and 8 mm lesion in segment 4A of the liver, with suspected rim of peripheral enhancement,  worrisome for metastases. Correlate with CEA. Consider biopsy of the dominant lesion or attention on follow-up as clinically warranted. Additional postsurgical and ancillary findings, as above. Electronically Signed   By: Julian Hy M.D.   On: 12/09/2016 12:29    Scheduled Meds: . aspirin EC  81 mg Oral Daily  . dronabinol  5 mg Oral BID AC  . enoxaparin (LOVENOX) injection  30 mg Subcutaneous Q24H  . feeding supplement (ENSURE ENLIVE)  237 mL Oral BID BM  . levothyroxine  25 mcg Oral QAC breakfast  . mouth rinse  15 mL Mouth Rinse BID   Continuous Infusions: . sodium chloride 100 mL/hr at 12/09/16 0458    Principal Problem:   SIRS (systemic inflammatory response syndrome) (HCC) Active Problems:   Cancer of left colon (HCC)   Failure to thrive in adult   Chronic renal insufficiency, stage 3 (moderate)   Protein-calorie malnutrition, severe    Time spent: 25 minutes    Barton Dubois  [facility] Pager [phone]. If 7PM-7AM, please contact night-coverage at [email or web address], password [facility] 12/09/2016, 3:09 PM  LOS: 3 days

## 2016-12-10 DIAGNOSIS — N179 Acute kidney failure, unspecified: Secondary | ICD-10-CM

## 2016-12-10 DIAGNOSIS — N183 Chronic kidney disease, stage 3 unspecified: Secondary | ICD-10-CM

## 2016-12-10 DIAGNOSIS — E860 Dehydration: Secondary | ICD-10-CM

## 2016-12-10 DIAGNOSIS — K769 Liver disease, unspecified: Secondary | ICD-10-CM

## 2016-12-10 LAB — BASIC METABOLIC PANEL
Anion gap: 4 — ABNORMAL LOW (ref 5–15)
BUN: 17 mg/dL (ref 6–20)
CO2: 18 mmol/L — ABNORMAL LOW (ref 22–32)
Calcium: 8.6 mg/dL — ABNORMAL LOW (ref 8.9–10.3)
Chloride: 115 mmol/L — ABNORMAL HIGH (ref 101–111)
Creatinine, Ser: 1.32 mg/dL — ABNORMAL HIGH (ref 0.44–1.00)
GFR calc Af Amer: 43 mL/min — ABNORMAL LOW (ref >60)
GFR calc non Af Amer: 37 mL/min — ABNORMAL LOW (ref >60)
Glucose, Bld: 86 mg/dL (ref 65–99)
Potassium: 3.6 mmol/L (ref 3.5–5.1)
Sodium: 137 mmol/L (ref 135–145)

## 2016-12-10 MED ORDER — AMLODIPINE BESYLATE 5 MG PO TABS: 10.0000 mg | ORAL_TABLET | Freq: Every day | ORAL | 1 refills | Status: DC

## 2016-12-10 MED ORDER — HEPARIN SOD (PORK) LOCK FLUSH 100 UNIT/ML IV SOLN
500.0000 [IU] | INTRAVENOUS | Status: AC | PRN
  Administered 2016-12-10: 500 [IU]

## 2016-12-10 NOTE — Discharge Summary (Signed)
Physician Discharge Summary  Melanie [name] VZD:[id number] DOB: 1935/01/25 DOA: 12/06/2016  PCP: Patient, No Pcp Per  Admit date: 12/06/2016 Discharge date: 12/10/2016  Time spent: 35 minutes  Recommendations for Outpatient Follow-up:  Repeat CBC to follow hemoglobin trend Repeat basic metabolic panel to follow electrolytes and renal function Patient will require further workup and decision in treatment for new found liver lesions (suggesting metastatic disease). Follow CEA level  Discharge Diagnoses:  Principal Problem:   SIRS (systemic inflammatory response syndrome) (HCC) Active Problems:   Cancer of left colon (HCC)   Failure to thrive in adult   Chronic renal insufficiency, stage 3 (moderate)   Protein-calorie malnutrition, severe   Acute renal failure superimposed on stage 3 chronic kidney disease (HCC)   Dehydration   Liver lesion   Discharge Condition: stable and improved. Patient had been discharged home with home health services arrange for home physical therapy. Patient will follow-up with her PCP in about 10 days and will contact Dr. Marin Olp office to set up follow-up appointment with oncology service.   Diet recommendation: Regular diet  Filed Weights   12/06/16 1349 12/06/16 1927  Weight: 60.3 kg (133 lb) 60.3 kg (132 lb 15 oz)    History of present illness:  82 y.o.femalewith medical history significant of colon cancer s/p colostomy; HTN; and hypothyroidism presenting with FTT. Patient had surgery for the colorectal cancer on March 29 - she also required nephrectomy and splenectomy. Her family reports a "great recovery". Initially startedchemo 5/30. Did well on her first 2 rounds. After the third round, she lost herappetite, had decreased PO intake, and has gradually becomeweaker and weaker. She went 2 weeks ago for onc f/u - given IVF with some short-term improvement. Over the last 2 weeks, she has had deteriorating energy, deteriorating fluid intake, and  diminishedappetite. Admitted for further work up of FTT.  Hospital Course:  1-SIRS: no source of infection appreciated yet -has remained asymptomatic and her SIRS features have resolved -cultures remains neg and w/o growth  -will continue to monitor off antibiotics -patient with neg lactic acid -will continue supportive care  2-colon cancer -status post surgical resection and ostomy -actively receiving chemotherapy by Dr. Marin Olp -has developed FTT and overall with guarded prognosis  -after renal improvement; MRI of liver has been done and patient had metastatic lesions appreciated. -will repeat CEA level; final results pending at discharge. -might need biopsy if plans are for further invasive therapy. Will follow oncology rec's  3-acute on chronic renal failure: stage 3 at baseline -appears to be secondary to lack of proper hydration (pre-renal in nature) -Resolve with IV fluids; creatinine at discharge back to patient's baseline.  -Continue minimizing/output voiding nephrotoxic agents and avoid hypotension  4-HTN -BP a stable and well controlled. -Plan is to resume lower/adjusted dose of antihypertensive regimen started on 12/11/16.  5-severe protein calorie malnutrition  -will follow nutritional service rec's -continue feeding supplements. -Patient encouraged to have good nutrition and hydration.  6-hypothyroidism -will continue synthroid   Procedures:  See below for x-ray reports.    MRI with contrast of the liver; demonstrating lesions in the left liver lobe that suggested metastatic disease.   Consultations:  Oncology service was aware of patient admission.  Discharge Exam: Vitals:   12/09/16 2104 12/10/16 0449  BP: 134/70 130/67  Pulse: 72 74  Resp: 20 18  Temp: 98.4 F (36.9 C) 98.7 F (37.1 C)    General: afebrile, no CP, no SOB, no abd pain, no nausea, no vomiting  and no dysuria. still with poor appetite.   Cardiovascular: S1 and S2, no rubs, no  gallops, no murmurs    Respiratory: good air movement, no wheezing, no crackles    Abdomen: soft, NT, ND, positive BS; stools seen in ostomy bag.  Musculoskeletal: no edema, no cyanosis or clubbing     Discharge Instructions   Discharge Instructions    Discharge instructions    Complete by:  As directed    Take medications as prescribed  Arrange follow up with PCP in 10 days Follow up with oncology service after discharge (office will contact you with appointment details) Please keep yourself well hydrated and follow good nutrition   Increase activity slowly    Complete by:  As directed      Current Discharge Medication List    CONTINUE these medications which have CHANGED   Details  amLODipine (NORVASC) 5 MG tablet Take 2 tablets (10 mg total) by mouth daily. Qty: 30 tablet, Refills: 1   Associated Diagnoses: Cancer of left colon ([place]); Iron deficiency anemia due to chronic blood loss      CONTINUE these medications which have NOT CHANGED   Details  calcium carbonate (OSCAL) 1500 (600 Ca) MG TABS tablet Take 600 mg of elemental calcium by mouth daily.    Associated Diagnoses: Cancer of left colon ([place]); Iron deficiency anemia due to chronic blood loss    dexamethasone (DECADRON) 4 MG tablet Take 2 tablets by mouth daily. Start day after chemotherapy for 2 days. Qty: 30 tablet, Refills: 1    dronabinol (MARINOL) 5 MG capsule Take 1 capsule (5 mg total) by mouth 2 (two) times daily before a meal. Qty: 60 capsule, Refills: 0   Associated Diagnoses: Cancer of left colon ([place]); Loss of appetite; Weight loss    ferrous sulfate 325 (65 FE) MG tablet Take 325 mg by mouth daily with breakfast.    Associated Diagnoses: Cancer of left colon ([place]); Iron deficiency anemia due to chronic blood loss    levothyroxine (SYNTHROID, LEVOTHROID) 25 MCG tablet Take 25 mcg by mouth daily before breakfast.    Associated Diagnoses: Cancer of left colon ([place]); Iron deficiency anemia due to  chronic blood loss    lidocaine-prilocaine (EMLA) cream Apply 1 application topically as needed. Qty: 30 g, Refills: 0    LORazepam (ATIVAN) 0.5 MG tablet Take 1 tablet (0.5 mg total) by mouth every 6 (six) hours as needed (Nausea/Vomitting). Qty: 30 tablet, Refills: 0    megestrol (MEGACE ES) 625 MG/5ML suspension Take 625 mg by mouth daily.    metoprolol tartrate (LOPRESSOR) 25 MG tablet Take 50 mg by mouth 2 (two) times daily.    Associated Diagnoses: Cancer of left colon ([place]); Iron deficiency anemia due to chronic blood loss    Multiple Vitamin (MULTIVITAMIN WITH MINERALS) TABS tablet Take 1 tablet by mouth daily.    ondansetron (ZOFRAN) 8 MG tablet Take 1 tablet (8 mg total) by mouth 2 (two) times daily as needed for nausea or vomiting. Start 3 days after chemo. Qty: 20 tablet, Refills: 0    oxyCODONE (OXY IR/ROXICODONE) 5 MG immediate release tablet Take 5 mg by mouth every 4 (four) hours as needed for severe pain.    Associated Diagnoses: Cancer of left colon ([place]); Iron deficiency anemia due to chronic blood loss    prochlorperazine (COMPAZINE) 10 MG tablet Take 1 tablet (10 mg total) by mouth every 6 (six) hours as needed for nausea or vomiting. Qty: 30 tablet, Refills: 1  STOP taking these medications     naproxen (NAPROSYN) 500 MG tablet      aspirin EC 81 MG tablet        No Known Allergies Follow-up Information    [facility] Follow up.   Specialty:  [place] Why:  [facility] Contact information: [address] [place] [address] [phone]        [facility]. Schedule an appointment as soon as possible for a visit.   Contact information: Dr. Calton Golds [address] [place], [place] [address] [phone]       Marin Olp, Rudell Cobb, MD Follow up.   Specialty:  Oncology Why:  office will contact you with appointment details  Contact information: [place] [address] [phone]            The results of significant diagnostics from this hospitalization (including imaging, microbiology, ancillary and laboratory) are listed below for reference.    Significant Diagnostic Studies: Ct Abdomen Pelvis Wo Contrast  Result Date: 11/22/2016 CLINICAL DATA:  Lower abdominal pain and 10 pound weight loss over last several weeks. Undergoing chemotherapy for sigmoid colon adenocarcinoma. Previous left nephrectomy. EXAM: CT ABDOMEN AND PELVIS WITHOUT CONTRAST TECHNIQUE: Multidetector CT imaging of the abdomen and pelvis was performed following the standard protocol without IV contrast. COMPARISON:  None. FINDINGS: Lower chest: No acute findings. Hepatobiliary: A 2.5 cm fluid attenuation cyst is seen in segment 3. A 1.2 cm low-attenuation lesion is seen in segment 2, and a 2.0 cm lesion with mildly increased attenuation is seen in segment 8 which cannot be characterized on this unenhanced exam. Pancreas: No mass or inflammatory process visualized on this unenhanced exam. Spleen: Prior splenectomy. 2.8 cm soft tissue density in splenectomy bed most likely represents a hypertrophied accessory splenule. A small fluid collection in the splenectomy bed associated with surgical clips most likely represents a chronic postop lymphocele or seroma. Adrenals/Urinary tract: Previous left nephrectomy. No evidence of urolithiasis or hydronephrosis involving right kidney. Unremarkable unopacified urinary bladder. Stomach/Bowel: Elevation of left hemidiaphragm. A focal nodular area of gastric wall thickening is seen involving the body of the stomach. Gastric neoplasm cannot be excluded. Postop changes from previous right colectomy and right lower quadrant ileostomy. Surgical staples also seen sigmoid colon. No evidence of bowel obstruction, inflammatory process, or abnormal fluid collections. Vascular/Lymphatic: No pathologically enlarged lymph nodes identified. No  evidence of abdominal aortic aneurysm. Aortic atherosclerosis. Reproductive: Nodular contour of uterus suggesting small uterine fibroids. Adnexal regions are unremarkable. Other:  None. Musculoskeletal:  No suspicious bone lesions identified. IMPRESSION: Several small indeterminate liver lesions which cannot be characterized on this unenhanced exam. Abdomen MRI without and with contrast recommended for further characterization. Postop changes from previous splenectomy and left nephrectomy. Focal nodular gastric wall thickening in the body the stomach. Gastric neoplasm cannot be excluded. Consider endoscopy for further evaluation. Small uterine fibroids. Aortic atherosclerosis. Electronically Signed   By: Earle Gell M.D.   On: 11/22/2016 12:05   Dg Chest 2 View  Result Date: 12/06/2016 CLINICAL DATA:  Shortness of breath and fatigue. History of colon carcinoma EXAM: CHEST  2 VIEW COMPARISON:  November 17, 2016 FINDINGS: Port-A-Cath tip is in the superior vena cava. No pneumothorax. There is no edema or consolidation. The heart size and pulmonary vascularity are normal. Aorta is tortuous but stable. No adenopathy evident. No bone lesions. IMPRESSION: Stable aortic tortuosity. No edema  or consolidation. No adenopathy. Port-A-Cath unchanged in position. Electronically Signed   By: Lowella Grip III M.D.   On: 12/06/2016 15:41   Dg Chest 2 View  Result Date: 11/17/2016 CLINICAL DATA:  Cough, weakness EXAM: CHEST  2 VIEW COMPARISON:  None. FINDINGS: Right Port-A-Cath in place with the tip in the SVC. Heart is normal size. Tortuosity of the thoracic aorta. Lungs are clear. No effusions or acute bony abnormality. IMPRESSION: No active cardiopulmonary disease. Electronically Signed   By: Rolm Baptise M.D.   On: 11/17/2016 11:49   Mr Abdomen W Wo Contrast  Result Date: 12/09/2016 CLINICAL DATA:  Lower abdominal pain, 10 pound weight loss, undergoing chemotherapy for sigmoid colon adenocarcinoma. Prior left  nephrectomy. Follow-up liver lesion on CT abdomen/pelvis. EXAM: MRI ABDOMEN WITHOUT AND WITH CONTRAST TECHNIQUE: Multiplanar multisequence MR imaging of the abdomen was performed both before and after the administration of intravenous contrast. CONTRAST:  43mL MULTIHANCE GADOBENATE DIMEGLUMINE 529 MG/ML IV SOLN COMPARISON:  CT abdomen/pelvis dated 11/22/2016 FINDINGS: Lower chest: Mild eventration of the left hemidiaphragm. Lung bases are otherwise clear. Hepatobiliary: 2.1 cm cyst in segment 3 (series 3/ image 22). 16 mm lesion in segment 8 (series 902/image 21). 8 mm lesion or in segment 4A (series 902/image 27). Both lesions are centrally hypoenhancing but appeared to demonstrate a region of enhancement on postcontrast subtraction imaging. Given the clinical history, these are worrisome for metastases. Gallbladder is unremarkable. No intrahepatic or extrahepatic ductal dilatation. Pancreas:  Within normal limits. Spleen: Surgically absent. Enhancing soft tissue in the left upper abdomen likely reflects residual splenic tissue (series 903/ image 34). Adjacent fluid collections. Adrenals/Urinary Tract:  Adrenal glands are within normal limits. Status post left nephrectomy. 11 mm medial right lower pole renal cyst (series 7/ image 33). Right kidney is otherwise within normal limits. No hydronephrosis. Stomach/Bowel: Inverted stomach beneath the elevated hemidiaphragm in the left upper abdomen. Visualized bowel is notable for a double-barrel ostomy in the anterior lower abdomen (series 7/ image 48), incompletely visualized. Vascular/Lymphatic:  No evidence of abdominal aortic aneurysm. No suspicious abdominal lymphadenopathy. Other:  No abdominal ascites. Musculoskeletal: No focal osseous lesions. IMPRESSION: 16 mm lesion in segment 8 and 8 mm lesion in segment 4A of the liver, with suspected rim of peripheral enhancement, worrisome for metastases. Correlate with CEA. Consider biopsy of the dominant lesion or  attention on follow-up as clinically warranted. Additional postsurgical and ancillary findings, as above. Electronically Signed   By: Julian Hy M.D.   On: 12/09/2016 12:29    Microbiology: Recent Results (from the past 240 hour(s))  Urine culture     Status: Abnormal   Collection Time: 12/06/16  4:54 PM  Result Value Ref Range Status   Specimen Description URINE, CLEAN CATCH  Final   Special Requests NONE  Final   Culture MULTIPLE ORGANISMS PRESENT, NONE PREDOMINANT (A)  Final   Report Status 12/07/2016 FINAL  Final  Blood culture (routine x 2)     Status: None (Preliminary result)   Collection Time: 12/06/16  6:08 PM  Result Value Ref Range Status   Specimen Description BLOOD RIGHT ANTECUBITAL  Final   Special Requests   Final    BOTTLES DRAWN AEROBIC AND ANAEROBIC Blood Culture adequate volume   Culture   Final    NO GROWTH 4 DAYS Performed at [facility], [place] [address] [place], [place] [address]    Report Status PENDING  Incomplete  Blood culture (routine x 2)  Status: None (Preliminary result)   Collection Time: 12/06/16  6:15 PM  Result Value Ref Range Status   Specimen Description BLOOD LEFT ANTECUBITAL  Final   Special Requests   Final    BOTTLES DRAWN AEROBIC AND ANAEROBIC Blood Culture adequate volume   Culture   Final    NO GROWTH 4 DAYS Performed at [facility], [address] [place], [place] [address]    Report Status PENDING  Incomplete     Labs: Basic Metabolic Panel:  Recent Labs Lab 12/06/16 1100 12/06/16 1541 12/07/16 0446 12/08/16 0309 12/09/16 0447 12/10/16 0633  NA 129 132* 134*  --  138 137  K 4.1 4.2 4.0  --  3.4* 3.6  CL 101 106 109  --  117* 115*  CO2 17* 16* 15*  --  15* 18*  GLUCOSE 164* 116* 97  --  72 86  BUN 52* 52* 44*  --  22* 17  CREATININE 2.3* 2.31* 1.97* 1.80* 1.39* 1.32*  CALCIUM 10.1 9.2 8.8*  --  8.0* 8.6*   Liver Function Tests:  Recent Labs Lab 12/06/16 1100 12/06/16 1541  AST 47* 41   ALT 23 17  ALKPHOS 191* 177*  BILITOT 0.60 0.3  PROT 7.8 7.2  ALBUMIN 3.1* 3.1*    Recent Labs Lab 12/06/16 1541  LIPASE 81*   No results for input(s): AMMONIA in the last 168 hours. CBC:  Recent Labs Lab 12/06/16 1100 12/06/16 1541 12/07/16 0446 12/09/16 0447  WBC 17.0* 20.5* 16.7* 9.6  NEUTROABS 14.3* 17.9*  --   --   HGB 13.9 13.6 11.3* 9.1*  HCT 39.9 37.9 33.1* 26.9*  MCV 85 84.6 84.7 84.3  PLT 245 233 210 180   Cardiac Enzymes:  Recent Labs Lab 12/06/16 1512 12/06/16 2148 12/07/16 0446 12/07/16 0934  TROPONINI 0.06* 0.06* 0.06* 0.06*   BNP: BNP (last 3 results)  Recent Labs  12/06/16 1512  BNP 41.1    ProBNP (last 3 results) No results for input(s): PROBNP in the last 8760 hours.  CBG: No results for input(s): GLUCAP in the last 168 hours.     Signed:  Barton Dubois MD.  [facility] 12/10/2016, 12:53 PM

## 2016-12-11 LAB — CULTURE, BLOOD (ROUTINE X 2)
Culture: NO GROWTH
Culture: NO GROWTH
Special Requests: ADEQUATE
Special Requests: ADEQUATE

## 2016-12-12 ENCOUNTER — Other Ambulatory Visit: Payer: Self-pay | Admitting: *Deleted

## 2016-12-12 DIAGNOSIS — C186 Malignant neoplasm of descending colon: Secondary | ICD-10-CM

## 2016-12-13 ENCOUNTER — Ambulatory Visit: Payer: Federal, State, Local not specified - PPO | Admitting: Hematology & Oncology

## 2016-12-13 ENCOUNTER — Ambulatory Visit (HOSPITAL_BASED_OUTPATIENT_CLINIC_OR_DEPARTMENT_OTHER): Payer: Medicare Other

## 2016-12-13 ENCOUNTER — Other Ambulatory Visit: Payer: Medicare Other

## 2016-12-13 ENCOUNTER — Ambulatory Visit (HOSPITAL_BASED_OUTPATIENT_CLINIC_OR_DEPARTMENT_OTHER): Payer: Medicare Other | Admitting: Hematology & Oncology

## 2016-12-13 ENCOUNTER — Other Ambulatory Visit (HOSPITAL_BASED_OUTPATIENT_CLINIC_OR_DEPARTMENT_OTHER): Payer: Medicare Other

## 2016-12-13 ENCOUNTER — Ambulatory Visit: Payer: Medicare Other

## 2016-12-13 VITALS — BP 91/44 | HR 75

## 2016-12-13 VITALS — BP 105/64 | HR 75 | Temp 98.2°F | Resp 16 | Wt 142.0 lb

## 2016-12-13 DIAGNOSIS — C186 Malignant neoplasm of descending colon: Secondary | ICD-10-CM | POA: Diagnosis present

## 2016-12-13 DIAGNOSIS — R932 Abnormal findings on diagnostic imaging of liver and biliary tract: Secondary | ICD-10-CM | POA: Diagnosis not present

## 2016-12-13 DIAGNOSIS — D500 Iron deficiency anemia secondary to blood loss (chronic): Secondary | ICD-10-CM

## 2016-12-13 DIAGNOSIS — Z933 Colostomy status: Secondary | ICD-10-CM

## 2016-12-13 DIAGNOSIS — R970 Elevated carcinoembryonic antigen [CEA]: Secondary | ICD-10-CM | POA: Diagnosis not present

## 2016-12-13 DIAGNOSIS — Z95828 Presence of other vascular implants and grafts: Secondary | ICD-10-CM

## 2016-12-13 LAB — CMP (CANCER CENTER ONLY)
ALT(SGPT): 14 U/L (ref 10–47)
AST: 36 U/L (ref 11–38)
Albumin: 2.8 g/dL — ABNORMAL LOW (ref 3.3–5.5)
Alkaline Phosphatase: 154 U/L — ABNORMAL HIGH (ref 26–84)
BUN, Bld: 12 mg/dL (ref 7–22)
CO2: 18 mEq/L (ref 18–33)
Calcium: 9.6 mg/dL (ref 8.0–10.3)
Chloride: 110 mEq/L — ABNORMAL HIGH (ref 98–108)
Creat: 1.2 mg/dl (ref 0.6–1.2)
Glucose, Bld: 98 mg/dL (ref 73–118)
Potassium: 3.2 mEq/L — ABNORMAL LOW (ref 3.3–4.7)
Sodium: 136 mEq/L (ref 128–145)
Total Bilirubin: 0.6 mg/dl (ref 0.20–1.60)
Total Protein: 6.3 g/dL — ABNORMAL LOW (ref 6.4–8.1)

## 2016-12-13 LAB — CEA: CEA: 3.6 ng/mL (ref 0.0–4.7)

## 2016-12-13 LAB — CBC WITH DIFFERENTIAL (CANCER CENTER ONLY)
BASO#: 0.1 10*3/uL (ref 0.0–0.2)
BASO%: 0.7 % (ref 0.0–2.0)
EOS%: 2.5 % (ref 0.0–7.0)
Eosinophils Absolute: 0.2 10*3/uL (ref 0.0–0.5)
HCT: 30.5 % — ABNORMAL LOW (ref 34.8–46.6)
HGB: 10.5 g/dL — ABNORMAL LOW (ref 11.6–15.9)
LYMPH#: 2 10*3/uL (ref 0.9–3.3)
LYMPH%: 20.6 % (ref 14.0–48.0)
MCH: 30.1 pg (ref 26.0–34.0)
MCHC: 34.4 g/dL (ref 32.0–36.0)
MCV: 87 fL (ref 81–101)
MONO#: 1.3 10*3/uL — ABNORMAL HIGH (ref 0.1–0.9)
MONO%: 13.1 % — ABNORMAL HIGH (ref 0.0–13.0)
NEUT#: 6.1 10*3/uL (ref 1.5–6.5)
NEUT%: 63.1 % (ref 39.6–80.0)
Platelets: 263 10*3/uL (ref 145–400)
RBC: 3.49 10*6/uL — ABNORMAL LOW (ref 3.70–5.32)
RDW: 22.2 % — ABNORMAL HIGH (ref 11.1–15.7)
WBC: 9.7 10*3/uL (ref 3.9–10.0)

## 2016-12-13 MED ORDER — FAMOTIDINE IN NACL 20-0.9 MG/50ML-% IV SOLN
40.0000 mg | Freq: Two times a day (BID) | INTRAVENOUS | Status: DC
  Administered 2016-12-13: 40 mg via INTRAVENOUS
  Administered 2016-12-13: 20 mg via INTRAVENOUS

## 2016-12-13 MED ORDER — HEPARIN SOD (PORK) LOCK FLUSH 100 UNIT/ML IV SOLN
500.0000 [IU] | Freq: Once | INTRAVENOUS | Status: AC
  Administered 2016-12-13: 500 [IU] via INTRAVENOUS
  Filled 2016-12-13: qty 5

## 2016-12-13 MED ORDER — SODIUM CHLORIDE 0.9 % IV SOLN
500.0000 mL | INTRAVENOUS | Status: DC
  Administered 2016-12-13: 09:00:00 via INTRAVENOUS

## 2016-12-13 MED ORDER — FAMOTIDINE IN NACL 20-0.9 MG/50ML-% IV SOLN
INTRAVENOUS | Status: AC
  Filled 2016-12-13: qty 100

## 2016-12-13 MED ORDER — SODIUM CHLORIDE 0.9% FLUSH
10.0000 mL | INTRAVENOUS | Status: DC | PRN
  Administered 2016-12-13: 10 mL via INTRAVENOUS
  Filled 2016-12-13: qty 10

## 2016-12-13 NOTE — Progress Notes (Signed)
Hematology and Oncology Follow Up Visit  Melanie [name] [id number] 07-14-1934 82 y.o. 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  She was just discharged from the hospital. She was admitted last week. She just was not getting better. She was not eating. She was declining.  In the hospital, she was obviously dehydrated. She got IV fluids. Her cultures were all taken. Cultures were all negative.  She did have an MRI of the liver done. Unfortunately, this showed 2 new liver lesions. One was 1.6 cm. The other was 0.8 cm. They were concerning for metastatic disease.  Her CEA has only been minimally elevated. Her last CEA back last week was 5.9.  She does look better. She is eating better.  She wants her colostomy to be reversed. She sees her surgeon in early August.  She's had no pain. She's had no fever. She's had no bleeding. She's had no rashes. She's had no cough.  I think that have not her in the hospital clearly helped her out.   ations:  Allergies as of 12/13/2016   No Known Allergies     Medication List       Accurate as of 12/13/16  9:03 AM. Always use your most recent med list.          calcium carbonate 1500 (600 Ca) MG Tabs tablet Commonly known as:  OSCAL Take 600 mg of elemental calcium by mouth daily.   dronabinol 5 MG capsule Commonly known as:  MARINOL Take 1 capsule (5 mg total) by mouth 2 (two) times daily before a meal.   levothyroxine 25 MCG tablet Commonly known as:  SYNTHROID, LEVOTHROID Take 25 mcg by mouth daily before breakfast.   lidocaine-prilocaine cream Commonly known as:  EMLA Apply 1 application topically as needed.   megestrol 625 MG/5ML  suspension Commonly known as:  MEGACE ES Take 625 mg by mouth daily.   metoprolol tartrate 25 MG tablet Commonly known as:  LOPRESSOR Take 50 mg by mouth 2 (two) times daily.   multivitamin with minerals Tabs tablet Take 1 tablet by mouth daily.   oxyCODONE 5 MG immediate release tablet Commonly known as:  Oxy IR/ROXICODONE Take 5 mg by mouth every 4 (four) hours as needed for severe pain.   prochlorperazine 10 MG tablet Commonly known as:  COMPAZINE Take 1 tablet (10 mg total) by mouth every 6 (six) hours as needed for nausea or vomiting.       Allergies: No Known Allergies  Past Medical History, Surgical history, Social history, and Family History were reviewed and updated.  Review of Systems: All other 10 point review of systems is negative.   Physical Exam:  weight is 142 lb (64.4 kg). Her oral temperature is 98.2 F (36.8 C). Her blood pressure is 105/64 and her pulse is 75. Her respiration is 16 and oxygen saturation is 100%.   Wt Readings from Last 3 Encounters:  12/13/16 142 lb (64.4 kg)  12/06/16 132 lb 15 oz (60.3 kg)  12/06/16 133 lb (60.3 kg)    Head and neck exam shows no ocular or oral lesions. There are no palpable cervical or supraclavicular lymph nodes. Lungs are clear bilaterally. Cardiac exam regular rate and rhythm  with no murmurs, rubs or bruits. Abdomen is soft. She has the laparoscopy scars. She has the temporary colostomy centered in the lower abdomen. She has good bowel sounds. There is no guarding or rebound tenderness. There is no obvious abdominal mass. There is no palpable liver or spleen tip. Back exam shows no tenderness over the spine, ribs or hips. Extremities shows no clubbing, cyanosis or edema. Skin exam shows no rashes, ecchymoses or petechia. Deferred   Lab Results  Component Value Date   WBC 9.7 12/13/2016   HGB 10.5 (L) 12/13/2016   HCT 30.5 (L) 12/13/2016   MCV 87 12/13/2016   PLT 263 12/13/2016   Lab Results  Component Value  Date   FERRITIN 346 (H) 11/22/2016   IRON 36 (L) 11/22/2016   TIBC 320 11/22/2016   UIBC 284 11/22/2016   IRONPCTSAT 11 (L) 11/22/2016   Lab Results  Component Value Date   RBC 3.49 (L) 12/13/2016   No results found for: KPAFRELGTCHN, LAMBDASER, KAPLAMBRATIO No results found for: Kandis Cocking, IGMSERUM No results found for: Odetta Pink, SPEI   Chemistry      Component Value Date/Time   NA 136 12/13/2016 0755   NA 137 09/30/2016 0940   K 3.2 (L) 12/13/2016 0755   K 4.7 09/30/2016 0940   CL 110 (H) 12/13/2016 0755   CO2 18 12/13/2016 0755   CO2 18 (L) 09/30/2016 0940   BUN 12 12/13/2016 0755   BUN 21.5 09/30/2016 0940   CREATININE 1.2 12/13/2016 0755   CREATININE 1.8 (H) 09/30/2016 0940      Component Value Date/Time   CALCIUM 9.6 12/13/2016 0755   CALCIUM 10.4 09/30/2016 0940   ALKPHOS 154 (H) 12/13/2016 0755   ALKPHOS 147 09/30/2016 0940   AST 36 12/13/2016 0755   AST 27 09/30/2016 0940   ALT 14 12/13/2016 0755   ALT 11 09/30/2016 0940   BILITOT 0.60 12/13/2016 0755   BILITOT 0.34 09/30/2016 0940      Impression and Plan: Melanie [name] is a very pleasant 82 yo African American female with stage IIIB adenocarcinoma of the descending colon, 1/25 lymph nodes positive.   I'm glad that she is feeling better. However, I am quite worried about this finding in her liver. I am somewhat perplexed that she would have liver metastasis already.  I will set her up with a PET scan. We'll see if this shows activity in the liver.  She really wants have the ostomy reversed. She will see her surgeon in early August. I thought that if he does reverse the ostomy, then maybe a liver biopsy can be done at the same time.  We will go ahead and give her some IV fluids today. Pap I'll like to see her back in about 2 weeks. Hopefully, she will have her PET scan by then.   I adjusted her medications a little bit. I don't she needs to  different blood pressure medicines given that her blood pressure is only 209 systolic. I went ahead and stop the amlodipine.   I spent about 30 minutes with she and her sister.   [name], MD 7/17/20189:03 AM

## 2016-12-13 NOTE — Patient Instructions (Signed)
Dehydration, Adult Dehydration is when there is not enough fluid or water in your body. This happens when you lose more fluids than you take in. Dehydration can range from mild to very bad. It should be treated right away to keep it from getting very bad. Symptoms of mild dehydration may include:  Thirst.  Dry lips.  Slightly dry mouth.  Dry, warm skin.  Dizziness. Symptoms of moderate dehydration may include:  Very dry mouth.  Muscle cramps.  Dark pee (urine). Pee may be the color of tea.  Your body making less pee.  Your eyes making fewer tears.  Heartbeat that is uneven or faster than normal (palpitations).  Headache.  Light-headedness, especially when you stand up from sitting.  Fainting (syncope). Symptoms of very bad dehydration may include:  Changes in skin, such as: ? Cold and clammy skin. ? Blotchy (mottled) or pale skin. ? Skin that does not quickly return to normal after being lightly pinched and let go (poor skin turgor).  Changes in body fluids, such as: ? Feeling very thirsty. ? Your eyes making fewer tears. ? Not sweating when body temperature is high, such as in hot weather. ? Your body making very little pee.  Changes in vital signs, such as: ? Weak pulse. ? Pulse that is more than 100 beats a minute when you are sitting still. ? Fast breathing. ? Low blood pressure.  Other changes, such as: ? Sunken eyes. ? Cold hands and feet. ? Confusion. ? Lack of energy (lethargy). ? Trouble waking up from sleep. ? Short-term weight loss. ? Unconsciousness. Follow these instructions at home:  If told by your doctor, drink an ORS: ? Make an ORS by using instructions on the package. ? Start by drinking small amounts, about  cup (120 mL) every 5-10 minutes. ? Slowly drink more until you have had the amount that your doctor said to have.  Drink enough clear fluid to keep your pee clear or pale yellow. If you were told to drink an ORS, finish the ORS  first, then start slowly drinking clear fluids. Drink fluids such as: ? Water. Do not drink only water by itself. Doing that can make the salt (sodium) level in your body get too low (hyponatremia). ? Ice chips. ? Fruit juice that you have added water to (diluted). ? Low-calorie sports drinks.  Avoid: ? Alcohol. ? Drinks that have a lot of sugar. These include high-calorie sports drinks, fruit juice that does not have water added, and soda. ? Caffeine. ? Foods that are greasy or have a lot of fat or sugar.  Take over-the-counter and prescription medicines only as told by your doctor.  Do not take salt tablets. Doing that can make the salt level in your body get too high (hypernatremia).  Eat foods that have minerals (electrolytes). Examples include bananas, oranges, potatoes, tomatoes, and spinach.  Keep all follow-up visits as told by your doctor. This is important. Contact a doctor if:  You have belly (abdominal) pain that: ? Gets worse. ? Stays in one area (localizes).  You have a rash.  You have a stiff neck.  You get angry or annoyed more easily than normal (irritability).  You are more sleepy than normal.  You have a harder time waking up than normal.  You feel: ? Weak. ? Dizzy. ? Very thirsty.  You have peed (urinated) only a small amount of very dark pee during 6-8 hours. Get help right away if:  You have symptoms of   very bad dehydration.  You cannot drink fluids without throwing up (vomiting).  Your symptoms get worse with treatment.  You have a fever.  You have a very bad headache.  You are throwing up or having watery poop (diarrhea) and it: ? Gets worse. ? Does not go away.  You have blood or something green (bile) in your throw-up.  You have blood in your poop (stool). This may cause poop to look black and tarry.  You have not peed in 6-8 hours.  You pass out (faint).  Your heart rate when you are sitting still is more than 100 beats a  minute.  You have trouble breathing. This information is not intended to replace advice given to you by your health care provider. Make sure you discuss any questions you have with your health care provider. Document Released: 03/12/2009 Document Revised: 12/04/2015 Document Reviewed: 07/10/2015 Elsevier Interactive Patient Education  2018 Elsevier Inc.  

## 2016-12-27 ENCOUNTER — Ambulatory Visit (HOSPITAL_BASED_OUTPATIENT_CLINIC_OR_DEPARTMENT_OTHER): Payer: Medicare Other | Admitting: Hematology & Oncology

## 2016-12-27 ENCOUNTER — Other Ambulatory Visit (HOSPITAL_BASED_OUTPATIENT_CLINIC_OR_DEPARTMENT_OTHER): Payer: Medicare Other

## 2016-12-27 ENCOUNTER — Ambulatory Visit (HOSPITAL_BASED_OUTPATIENT_CLINIC_OR_DEPARTMENT_OTHER): Payer: Medicare Other

## 2016-12-27 VITALS — BP 120/71 | HR 65 | Temp 98.0°F | Resp 18 | Wt 140.2 lb

## 2016-12-27 DIAGNOSIS — D500 Iron deficiency anemia secondary to blood loss (chronic): Secondary | ICD-10-CM | POA: Diagnosis not present

## 2016-12-27 DIAGNOSIS — C186 Malignant neoplasm of descending colon: Secondary | ICD-10-CM

## 2016-12-27 DIAGNOSIS — Z95828 Presence of other vascular implants and grafts: Secondary | ICD-10-CM

## 2016-12-27 LAB — CEA (IN HOUSE-CHCC): CEA (CHCC-In House): 5.6 ng/mL — ABNORMAL HIGH (ref 0.00–5.00)

## 2016-12-27 LAB — CMP (CANCER CENTER ONLY)
ALT(SGPT): 10 U/L (ref 10–47)
AST: 32 U/L (ref 11–38)
Albumin: 2.9 g/dL — ABNORMAL LOW (ref 3.3–5.5)
Alkaline Phosphatase: 142 U/L — ABNORMAL HIGH (ref 26–84)
BUN, Bld: 12 mg/dL (ref 7–22)
CO2: 22 mEq/L (ref 18–33)
Calcium: 9.3 mg/dL (ref 8.0–10.3)
Chloride: 112 mEq/L — ABNORMAL HIGH (ref 98–108)
Creat: 1.6 mg/dl — ABNORMAL HIGH (ref 0.6–1.2)
Glucose, Bld: 126 mg/dL — ABNORMAL HIGH (ref 73–118)
Potassium: 3.5 mEq/L (ref 3.3–4.7)
Sodium: 135 mEq/L (ref 128–145)
Total Bilirubin: 0.6 mg/dl (ref 0.20–1.60)
Total Protein: 6.8 g/dL (ref 6.4–8.1)

## 2016-12-27 LAB — CBC WITH DIFFERENTIAL (CANCER CENTER ONLY)
BASO#: 0.1 10*3/uL (ref 0.0–0.2)
BASO%: 0.9 % (ref 0.0–2.0)
EOS%: 2.3 % (ref 0.0–7.0)
Eosinophils Absolute: 0.2 10*3/uL (ref 0.0–0.5)
HCT: 33.5 % — ABNORMAL LOW (ref 34.8–46.6)
HGB: 11 g/dL — ABNORMAL LOW (ref 11.6–15.9)
LYMPH#: 1.5 10*3/uL (ref 0.9–3.3)
LYMPH%: 21.4 % (ref 14.0–48.0)
MCH: 30.1 pg (ref 26.0–34.0)
MCHC: 32.8 g/dL (ref 32.0–36.0)
MCV: 92 fL (ref 81–101)
MONO#: 0.9 10*3/uL (ref 0.1–0.9)
MONO%: 12.7 % (ref 0.0–13.0)
NEUT#: 4.4 10*3/uL (ref 1.5–6.5)
NEUT%: 62.7 % (ref 39.6–80.0)
Platelets: 368 10*3/uL (ref 145–400)
RBC: 3.65 10*6/uL — ABNORMAL LOW (ref 3.70–5.32)
RDW: 21.7 % — ABNORMAL HIGH (ref 11.1–15.7)
WBC: 6.9 10*3/uL (ref 3.9–10.0)

## 2016-12-27 LAB — IRON AND TIBC
%SAT: 23 % (ref 21–57)
Iron: 58 ug/dL (ref 41–142)
TIBC: 246 ug/dL (ref 236–444)
UIBC: 189 ug/dL (ref 120–384)

## 2016-12-27 LAB — FERRITIN: Ferritin: 175 ng/ml (ref 9–269)

## 2016-12-27 MED ORDER — SODIUM CHLORIDE 0.9% FLUSH
10.0000 mL | INTRAVENOUS | Status: DC | PRN
  Administered 2016-12-27: 10 mL via INTRAVENOUS
  Filled 2016-12-27: qty 10

## 2016-12-27 MED ORDER — HEPARIN SOD (PORK) LOCK FLUSH 100 UNIT/ML IV SOLN
500.0000 [IU] | Freq: Once | INTRAVENOUS | Status: AC
  Administered 2016-12-27: 500 [IU] via INTRAVENOUS
  Filled 2016-12-27: qty 5

## 2016-12-27 NOTE — Progress Notes (Signed)
Hematology and Oncology Follow Up Visit  Melanie [name] [id number] Sep 04, 1934 82 y.o. 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  She actually walked in. As such, she is doing much better. She is eating a little bit better.  She has not had a PET scan done. I don't think we will be oh to get one before she has surgery to reverse her ileostomy.  She just feels stronger. She is not having any fever. She's having no bleeding. Having no leg swelling.  She does have a lot of "gas". I will try her on some Creon. I gave her some samples that we had from the office. I told her to take this twice a day. If it does help with the "gas" then we will send a prescription in to her.  She will see her doctor, Dr. Truddie Hidden, this Friday. He is her Psychologist, sport and exercise. He will see her for ostomy reversal. I need to talk to him about the possibility of doing a liver biopsy while she is "opened up" to see if there is actual malignancy.   Her last CEA was only 3.6. I would be surprised if she did have any malignancy.  Currently, her performance status is ECOG 2.   ations:  Allergies as of 12/27/2016   No Known Allergies     Medication List       Accurate as of 12/27/16 11:49 AM. Always use your most recent med list.          calcium carbonate 1500 (600 Ca) MG Tabs tablet Commonly known as:  OSCAL Take 600 mg of elemental calcium by mouth daily.   dronabinol 5 MG capsule Commonly known as:  MARINOL Take 1 capsule (5 mg total) by mouth 2 (two) times daily before a meal.   levothyroxine 25 MCG tablet Commonly known as:  SYNTHROID, LEVOTHROID Take 25 mcg by mouth daily before breakfast.   lidocaine-prilocaine cream Commonly  known as:  EMLA Apply 1 application topically as needed.   metoprolol tartrate 25 MG tablet Commonly known as:  LOPRESSOR Take 50 mg by mouth 2 (two) times daily.   multivitamin with minerals Tabs tablet Take 1 tablet by mouth daily.   oxyCODONE 5 MG immediate release tablet Commonly known as:  Oxy IR/ROXICODONE Take 5 mg by mouth every 4 (four) hours as needed for severe pain.   prochlorperazine 10 MG tablet Commonly known as:  COMPAZINE Take 1 tablet (10 mg total) by mouth every 6 (six) hours as needed for nausea or vomiting.       Allergies: No Known Allergies  Past Medical History, Surgical history, Social history, and Family History were reviewed and updated.  Review of Systems: All other 10 point review of systems is negative.   Physical Exam:  weight is 140 lb 4 oz (63.6 kg). Her oral temperature is 98 F (36.7 C). Her blood pressure is 120/71 and her pulse is 65. Her respiration is 18 and oxygen saturation is 100%.   Wt Readings from Last 3 Encounters:  12/27/16 140 lb 4 oz (63.6 kg)  12/13/16 142 lb (64.4 kg)  12/06/16 132 lb 15 oz (60.3 kg)    Head and neck exam shows no ocular or oral  lesions. There are no palpable cervical or supraclavicular lymph nodes. Lungs are clear bilaterally. Cardiac exam regular rate and rhythm with no murmurs, rubs or bruits. Abdomen is soft. She has the laparoscopy scars. She has the temporary colostomy centered in the lower abdomen. She has good bowel sounds. There is no guarding or rebound tenderness. There is no obvious abdominal mass. There is no palpable liver or spleen tip. Back exam shows no tenderness over the spine, ribs or hips. Extremities shows no clubbing, cyanosis or edema. Skin exam shows no rashes, ecchymoses or petechia. Deferred   Lab Results  Component Value Date   WBC 6.9 12/27/2016   HGB 11.0 (L) 12/27/2016   HCT 33.5 (L) 12/27/2016   MCV 92 12/27/2016   PLT 368 12/27/2016   Lab Results  Component Value Date     FERRITIN 346 (H) 11/22/2016   IRON 36 (L) 11/22/2016   TIBC 320 11/22/2016   UIBC 284 11/22/2016   IRONPCTSAT 11 (L) 11/22/2016   Lab Results  Component Value Date   RBC 3.65 (L) 12/27/2016   No results found for: KPAFRELGTCHN, LAMBDASER, KAPLAMBRATIO No results found for: IGGSERUM, IGA, IGMSERUM No results found for: Odetta Pink, SPEI   Chemistry      Component Value Date/Time   NA 135 12/27/2016 1022   NA 137 09/30/2016 0940   K 3.5 12/27/2016 1022   K 4.7 09/30/2016 0940   CL 112 (H) 12/27/2016 1022   CO2 22 12/27/2016 1022   CO2 18 (L) 09/30/2016 0940   BUN 12 12/27/2016 1022   BUN 21.5 09/30/2016 0940   CREATININE 1.6 (H) 12/27/2016 1022   CREATININE 1.8 (H) 09/30/2016 0940      Component Value Date/Time   CALCIUM 9.3 12/27/2016 1022   CALCIUM 10.4 09/30/2016 0940   ALKPHOS 142 (H) 12/27/2016 1022   ALKPHOS 147 09/30/2016 0940   AST 32 12/27/2016 1022   AST 27 09/30/2016 0940   ALT 10 12/27/2016 1022   ALT 11 09/30/2016 0940   BILITOT 0.60 12/27/2016 1022   BILITOT 0.34 09/30/2016 0940      Impression and Plan: Melanie [name] is a very pleasant 82 yo African American female with stage IIIB adenocarcinoma of the descending colon, 1/25 lymph nodes positive.   I'm glad that she is feeling better.It is still unclear as to whether or not she does have malignancy in her liver. Given that the CEA is normal, I would be surprised.  I probably will get her back to see [place] in another couple months. I figured that she probably will have her surgery in late August. I want to have her recover from this before we have to see her back.  Hopefully, she will continue to improve with her status.  I spent about 30 minutes with she and her sister.   [name], MD 7/31/201811:49 AM

## 2017-01-25 ENCOUNTER — Ambulatory Visit (HOSPITAL_COMMUNITY)
Admission: RE | Admit: 2017-01-25 | Discharge: 2017-01-25 | Disposition: A | Discharge status: Home / Self Care | Payer: Medicare Other | Source: Ambulatory Visit | Attending: Hematology & Oncology | Admitting: Hematology & Oncology

## 2017-01-25 DIAGNOSIS — Z9081 Acquired absence of spleen: Secondary | ICD-10-CM | POA: Insufficient documentation

## 2017-01-25 DIAGNOSIS — E049 Nontoxic goiter, unspecified: Secondary | ICD-10-CM | POA: Insufficient documentation

## 2017-01-25 DIAGNOSIS — I2510 Atherosclerotic heart disease of native coronary artery without angina pectoris: Secondary | ICD-10-CM | POA: Diagnosis not present

## 2017-01-25 DIAGNOSIS — I700 Atherosclerosis of aorta: Secondary | ICD-10-CM | POA: Diagnosis not present

## 2017-01-25 DIAGNOSIS — C186 Malignant neoplasm of descending colon: Secondary | ICD-10-CM | POA: Diagnosis not present

## 2017-01-25 DIAGNOSIS — J90 Pleural effusion, not elsewhere classified: Secondary | ICD-10-CM | POA: Diagnosis not present

## 2017-01-25 DIAGNOSIS — K769 Liver disease, unspecified: Secondary | ICD-10-CM | POA: Insufficient documentation

## 2017-01-25 DIAGNOSIS — D500 Iron deficiency anemia secondary to blood loss (chronic): Secondary | ICD-10-CM | POA: Diagnosis not present

## 2017-01-25 DIAGNOSIS — Z905 Acquired absence of kidney: Secondary | ICD-10-CM | POA: Insufficient documentation

## 2017-01-25 LAB — GLUCOSE, CAPILLARY: Glucose-Capillary: 101 mg/dL — ABNORMAL HIGH (ref 65–99)

## 2017-01-25 MED ORDER — FLUDEOXYGLUCOSE F - 18 (FDG) INJECTION
7.9000 | Freq: Once | INTRAVENOUS | Status: AC
  Administered 2017-01-25: 7.9 via INTRAVENOUS

## 2017-02-01 ENCOUNTER — Other Ambulatory Visit (HOSPITAL_BASED_OUTPATIENT_CLINIC_OR_DEPARTMENT_OTHER): Payer: Medicare Other

## 2017-02-01 ENCOUNTER — Ambulatory Visit (HOSPITAL_BASED_OUTPATIENT_CLINIC_OR_DEPARTMENT_OTHER): Payer: Medicare Other

## 2017-02-01 ENCOUNTER — Ambulatory Visit (HOSPITAL_BASED_OUTPATIENT_CLINIC_OR_DEPARTMENT_OTHER): Payer: Medicare Other | Admitting: Hematology & Oncology

## 2017-02-01 VITALS — BP 154/75 | HR 71 | Temp 98.5°F | Resp 18 | Wt 168.4 lb

## 2017-02-01 DIAGNOSIS — C186 Malignant neoplasm of descending colon: Secondary | ICD-10-CM

## 2017-02-01 DIAGNOSIS — D500 Iron deficiency anemia secondary to blood loss (chronic): Secondary | ICD-10-CM | POA: Diagnosis not present

## 2017-02-01 DIAGNOSIS — E876 Hypokalemia: Secondary | ICD-10-CM

## 2017-02-01 DIAGNOSIS — Z95828 Presence of other vascular implants and grafts: Secondary | ICD-10-CM

## 2017-02-01 LAB — CBC WITH DIFFERENTIAL (CANCER CENTER ONLY)
BASO#: 0.1 10*3/uL (ref 0.0–0.2)
BASO%: 0.5 % (ref 0.0–2.0)
EOS%: 3.5 % (ref 0.0–7.0)
Eosinophils Absolute: 0.3 10*3/uL (ref 0.0–0.5)
HCT: 26 % — ABNORMAL LOW (ref 34.8–46.6)
HGB: 8.4 g/dL — ABNORMAL LOW (ref 11.6–15.9)
LYMPH#: 1.7 10*3/uL (ref 0.9–3.3)
LYMPH%: 17.9 % (ref 14.0–48.0)
MCH: 30.3 pg (ref 26.0–34.0)
MCHC: 32.3 g/dL (ref 32.0–36.0)
MCV: 94 fL (ref 81–101)
MONO#: 1.3 10*3/uL — ABNORMAL HIGH (ref 0.1–0.9)
MONO%: 14.5 % — ABNORMAL HIGH (ref 0.0–13.0)
NEUT#: 5.9 10*3/uL (ref 1.5–6.5)
NEUT%: 63.6 % (ref 39.6–80.0)
Platelets: 446 10*3/uL — ABNORMAL HIGH (ref 145–400)
RBC: 2.77 10*6/uL — ABNORMAL LOW (ref 3.70–5.32)
RDW: 17.7 % — ABNORMAL HIGH (ref 11.1–15.7)
WBC: 9.2 10*3/uL (ref 3.9–10.0)

## 2017-02-01 LAB — FERRITIN: Ferritin: 151 ng/ml (ref 9–269)

## 2017-02-01 LAB — CMP (CANCER CENTER ONLY)
ALT(SGPT): 12 U/L (ref 10–47)
AST: 31 U/L (ref 11–38)
Albumin: 2.5 g/dL — ABNORMAL LOW (ref 3.3–5.5)
Alkaline Phosphatase: 137 U/L — ABNORMAL HIGH (ref 26–84)
BUN, Bld: 15 mg/dL (ref 7–22)
CO2: 29 mEq/L (ref 18–33)
Calcium: 8.8 mg/dL (ref 8.0–10.3)
Chloride: 107 mEq/L (ref 98–108)
Creat: 1.4 mg/dl — ABNORMAL HIGH (ref 0.6–1.2)
Glucose, Bld: 97 mg/dL (ref 73–118)
Potassium: 3.2 mEq/L — ABNORMAL LOW (ref 3.3–4.7)
Sodium: 142 mEq/L (ref 128–145)
Total Bilirubin: 0.6 mg/dl (ref 0.20–1.60)
Total Protein: 6 g/dL — ABNORMAL LOW (ref 6.4–8.1)

## 2017-02-01 LAB — IRON AND TIBC
%SAT: 23 % (ref 21–57)
Iron: 43 ug/dL (ref 41–142)
TIBC: 193 ug/dL — ABNORMAL LOW (ref 236–444)
UIBC: 149 ug/dL (ref 120–384)

## 2017-02-01 LAB — CEA (IN HOUSE-CHCC): CEA (CHCC-In House): 4.05 ng/mL (ref 0.00–5.00)

## 2017-02-01 MED ORDER — SODIUM CHLORIDE 0.9% FLUSH
10.0000 mL | INTRAVENOUS | Status: DC | PRN
  Administered 2017-02-01: 10 mL via INTRAVENOUS
  Filled 2017-02-01: qty 10

## 2017-02-01 MED ORDER — HEPARIN SOD (PORK) LOCK FLUSH 100 UNIT/ML IV SOLN
500.0000 [IU] | Freq: Once | INTRAVENOUS | Status: AC
  Administered 2017-02-01: 500 [IU] via INTRAVENOUS
  Filled 2017-02-01: qty 5

## 2017-02-01 MED ORDER — TRIAMTERENE-HCTZ 37.5-25 MG PO TABS
ORAL_TABLET | ORAL | 1 refills | Status: DC
Start: 2017-02-01 — End: 2017-06-15

## 2017-02-01 MED ORDER — POTASSIUM CHLORIDE CRYS ER 20 MEQ PO TBCR: 20.0000 meq | EXTENDED_RELEASE_TABLET | Freq: Every day | ORAL | 1 refills | Status: DC

## 2017-02-01 NOTE — Progress Notes (Signed)
Hematology and Oncology Follow Up Visit  Melanie [name] [id number] 1935-04-25 82 y.o. 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  I am very impressed with how well she looks. She had her colostomy reversal a couple weeks ago. She got through this very well. She said that her surgeon, Dr. Truddie Hidden, did a liver biopsy and at this was normal.  Of note, we did do a PET scan on her. The PET scan was done last week. The PET scan did show a small area of hypermetabolic activity. This was in segment 8. It measured 1.6 cm. It had an SUV of 8.6. I will have to speak to her surgeon to see exactly what he biopsied.  She does feel a little bit tired. Her hemoglobin is 8.4 today. Prior studies are borderline low. We may have to give her some IV iron.  She's had a better appetite. She is eating what she would like.  She's not having any cough or shortness of breath.  There is some leg swelling. She probably has is because of the anemia. However, I will try her on some Maxide to see if this can help.  She has had no fever. She has had no rashes. She's had no headache.  Overall, her performance status is ECOG 1.   ations:  Allergies as of 02/01/2017   No Known Allergies     Medication List       Accurate as of 02/01/17 12:30 PM. Always use your most recent med list.          calcium carbonate 1500 (600 Ca) MG Tabs tablet Commonly known as:  OSCAL Take 600 mg of elemental calcium by mouth daily.   dronabinol 5 MG capsule Commonly known as:  MARINOL Take 1 capsule (5 mg total) by mouth 2 (two) times daily before a meal.   levothyroxine 25 MCG tablet Commonly known as:  SYNTHROID, LEVOTHROID Take 25 mcg by mouth  daily before breakfast.   lidocaine-prilocaine cream Commonly known as:  EMLA Apply 1 application topically as needed.   metoprolol tartrate 25 MG tablet Commonly known as:  LOPRESSOR Take 50 mg by mouth 2 (two) times daily.   multivitamin with minerals Tabs tablet Take 1 tablet by mouth daily.   oxyCODONE 5 MG immediate release tablet Commonly known as:  Oxy IR/ROXICODONE Take 5 mg by mouth every 4 (four) hours as needed for severe pain.   prochlorperazine 10 MG tablet Commonly known as:  COMPAZINE Take 1 tablet (10 mg total) by mouth every 6 (six) hours as needed for nausea or vomiting.       Allergies: No Known Allergies  Past Medical History, Surgical history, Social history, and Family History were reviewed and updated.  Review of Systems: All other 10 point review of systems is negative.   Physical Exam:  weight is 168 lb 6 oz (76.4 kg). Her oral temperature is 98.5 F (36.9 C). Her blood pressure is 154/75 (abnormal) and her pulse is 71. Her respiration is 18 and oxygen saturation is 98%.   Wt Readings from Last 3 Encounters:  02/01/17 168 lb 6 oz (76.4 kg)  12/27/16 140 lb 4 oz (63.6 kg)  12/13/16 142 lb (64.4 kg)  Well-developed and well-nourished African-American female. She is somewhat elderly in appearance. Head and neck exam shows no ocular or oral lesions. There are no palpable lymph nodes in the neck. Lungs are clear bilateral. Cardiac exam regular rate and rhythm with no murmurs, rubs or bruits. Abdomen is soft. Bowel sounds are present. She has a dressing over the colostomy site that was reversed. There is no fluid wave. There is no abdominal mass. There is no palpable liver or spleen tip. Extremities shows 1+ edema in her ankles and feet. She is good range of motion of her joints. Skin exam shows no rashes, ecchymoses or petechia. Neurological exam shows no focal neurological deficits.    Lab Results  Component Value Date   WBC 9.2 02/01/2017   HGB  8.4 (L) 02/01/2017   HCT 26.0 (L) 02/01/2017   MCV 94 02/01/2017   PLT 446 (H) 02/01/2017   Lab Results  Component Value Date   FERRITIN 175 12/27/2016   IRON 58 12/27/2016   TIBC 246 12/27/2016   UIBC 189 12/27/2016   IRONPCTSAT 23 12/27/2016   Lab Results  Component Value Date   RBC 2.77 (L) 02/01/2017   No results found for: KPAFRELGTCHN, LAMBDASER, KAPLAMBRATIO No results found for: IGGSERUM, IGA, IGMSERUM No results found for: Odetta Pink, SPEI   Chemistry      Component Value Date/Time   NA 142 02/01/2017 1054   NA 137 09/30/2016 0940   K 3.2 (L) 02/01/2017 1054   K 4.7 09/30/2016 0940   CL 107 02/01/2017 1054   CO2 29 02/01/2017 1054   CO2 18 (L) 09/30/2016 0940   BUN 15 02/01/2017 1054   BUN 21.5 09/30/2016 0940   CREATININE 1.4 (H) 02/01/2017 1054   CREATININE 1.8 (H) 09/30/2016 0940      Component Value Date/Time   CALCIUM 8.8 02/01/2017 1054   CALCIUM 10.4 09/30/2016 0940   ALKPHOS 137 (H) 02/01/2017 1054   ALKPHOS 147 09/30/2016 0940   AST 31 02/01/2017 1054   AST 27 09/30/2016 0940   ALT 12 02/01/2017 1054   ALT 11 09/30/2016 0940   BILITOT 0.60 02/01/2017 1054   BILITOT 0.34 09/30/2016 0940      Impression and Plan: Melanie [name] is a very pleasant 82 year old African-American female. She initially present with stage IIIB adenocarcinoma of the descending colon. We tried her on adjuvant chemotherapy which she tolerated very poorly.  I'm still suspicious that she does have liver metastases.  Her CEA today was 4.1. This is somewhat reassuring.  I think that we can just follow her along. I think a PET scan would be the best way to go.  I will get a PET scan on her in 2 or 3 months. I think this would be the best way for [place] to see if there is any obvious recurrence.  I will have to speak to her surgeon and try to get the pathology report from the liver biopsy.  We will go ahead and give her a  dose of IV iron. I think this will help with her anemia. Both she and her sister agree to this.  She does have a Port-A-Cath. She really wants to go back home to [facility]. I don't think that this could be flushed locally.  I will get her back to see me after her PET scan.  I spent about 30 minutes with she and her sister. I answered all their questions. I reviewed the PET  scan with them.      [name], MD 9/5/201812:30 PM

## 2017-02-03 ENCOUNTER — Ambulatory Visit (HOSPITAL_BASED_OUTPATIENT_CLINIC_OR_DEPARTMENT_OTHER): Payer: Medicare Other

## 2017-02-03 VITALS — BP 160/73 | HR 66 | Temp 97.8°F

## 2017-02-03 DIAGNOSIS — D500 Iron deficiency anemia secondary to blood loss (chronic): Secondary | ICD-10-CM

## 2017-02-03 MED ORDER — HEPARIN SOD (PORK) LOCK FLUSH 100 UNIT/ML IV SOLN
500.0000 [IU] | Freq: Once | INTRAVENOUS | Status: AC
  Administered 2017-02-03: 500 [IU] via INTRAVENOUS
  Filled 2017-02-03: qty 5

## 2017-02-03 MED ORDER — SODIUM CHLORIDE 0.9 % IV SOLN
510.0000 mg | Freq: Once | INTRAVENOUS | Status: AC
  Administered 2017-02-03: 510 mg via INTRAVENOUS
  Filled 2017-02-03: qty 17

## 2017-02-03 MED ORDER — SODIUM CHLORIDE 0.9% FLUSH
10.0000 mL | INTRAVENOUS | Status: DC | PRN
  Administered 2017-02-03: 10 mL via INTRAVENOUS
  Filled 2017-02-03: qty 10

## 2017-02-03 MED ORDER — SODIUM CHLORIDE 0.9 % IV SOLN
INTRAVENOUS | Status: DC
  Administered 2017-02-03: 15:00:00 via INTRAVENOUS

## 2017-02-03 NOTE — Patient Instructions (Signed)

## 2017-02-22 ENCOUNTER — Encounter: Payer: Self-pay | Admitting: Hematology & Oncology

## 2017-02-22 NOTE — Progress Notes (Unsigned)
Faxed medical records to: [facility] [address] [place], [place] [address] P: [phone] F: [phone]      COPY SCANNED

## 2017-04-10 ENCOUNTER — Ambulatory Visit: Payer: Federal, State, Local not specified - PPO

## 2017-04-10 ENCOUNTER — Other Ambulatory Visit: Payer: Federal, State, Local not specified - PPO

## 2017-04-10 ENCOUNTER — Ambulatory Visit: Payer: Federal, State, Local not specified - PPO | Admitting: Hematology & Oncology

## 2017-04-24 ENCOUNTER — Ambulatory Visit (HOSPITAL_COMMUNITY)
Admission: RE | Admit: 2017-04-24 | Discharge: 2017-04-24 | Disposition: A | Discharge status: Home / Self Care | Payer: Medicare Other | Source: Ambulatory Visit | Attending: Hematology & Oncology | Admitting: Hematology & Oncology

## 2017-04-24 DIAGNOSIS — C787 Secondary malignant neoplasm of liver and intrahepatic bile duct: Secondary | ICD-10-CM | POA: Diagnosis not present

## 2017-04-24 DIAGNOSIS — E049 Nontoxic goiter, unspecified: Secondary | ICD-10-CM | POA: Insufficient documentation

## 2017-04-24 DIAGNOSIS — E876 Hypokalemia: Secondary | ICD-10-CM | POA: Insufficient documentation

## 2017-04-24 DIAGNOSIS — Z9081 Acquired absence of spleen: Secondary | ICD-10-CM | POA: Insufficient documentation

## 2017-04-24 DIAGNOSIS — D500 Iron deficiency anemia secondary to blood loss (chronic): Secondary | ICD-10-CM | POA: Diagnosis not present

## 2017-04-24 DIAGNOSIS — C186 Malignant neoplasm of descending colon: Secondary | ICD-10-CM | POA: Diagnosis present

## 2017-04-24 LAB — GLUCOSE, CAPILLARY: Glucose-Capillary: 99 mg/dL (ref 65–99)

## 2017-04-24 MED ORDER — FLUDEOXYGLUCOSE F - 18 (FDG) INJECTION
8.6100 | Freq: Once | INTRAVENOUS | Status: AC | PRN
  Administered 2017-04-24: 8.61 via INTRAVENOUS

## 2017-04-26 ENCOUNTER — Other Ambulatory Visit (HOSPITAL_BASED_OUTPATIENT_CLINIC_OR_DEPARTMENT_OTHER): Payer: Medicare Other

## 2017-04-26 ENCOUNTER — Ambulatory Visit: Payer: Medicare Other

## 2017-04-26 ENCOUNTER — Ambulatory Visit (HOSPITAL_BASED_OUTPATIENT_CLINIC_OR_DEPARTMENT_OTHER): Payer: Medicare Other | Admitting: Hematology & Oncology

## 2017-04-26 ENCOUNTER — Encounter: Payer: Self-pay | Admitting: Hematology & Oncology

## 2017-04-26 ENCOUNTER — Ambulatory Visit (HOSPITAL_BASED_OUTPATIENT_CLINIC_OR_DEPARTMENT_OTHER): Payer: Medicare Other

## 2017-04-26 ENCOUNTER — Other Ambulatory Visit: Payer: Self-pay

## 2017-04-26 VITALS — BP 190/84 | HR 58 | Temp 98.8°F | Wt 168.0 lb

## 2017-04-26 DIAGNOSIS — E876 Hypokalemia: Secondary | ICD-10-CM

## 2017-04-26 DIAGNOSIS — D500 Iron deficiency anemia secondary to blood loss (chronic): Secondary | ICD-10-CM | POA: Diagnosis not present

## 2017-04-26 DIAGNOSIS — C186 Malignant neoplasm of descending colon: Secondary | ICD-10-CM | POA: Diagnosis present

## 2017-04-26 LAB — CBC WITH DIFFERENTIAL (CANCER CENTER ONLY)
BASO#: 0 10*3/uL (ref 0.0–0.2)
BASO%: 0.6 % (ref 0.0–2.0)
EOS%: 5.5 % (ref 0.0–7.0)
Eosinophils Absolute: 0.4 10*3/uL (ref 0.0–0.5)
HCT: 35.8 % (ref 34.8–46.6)
HGB: 12 g/dL (ref 11.6–15.9)
LYMPH#: 1.6 10*3/uL (ref 0.9–3.3)
LYMPH%: 25.5 % (ref 14.0–48.0)
MCH: 29.9 pg (ref 26.0–34.0)
MCHC: 33.5 g/dL (ref 32.0–36.0)
MCV: 89 fL (ref 81–101)
MONO#: 1 10*3/uL — ABNORMAL HIGH (ref 0.1–0.9)
MONO%: 16.3 % — ABNORMAL HIGH (ref 0.0–13.0)
NEUT#: 3.3 10*3/uL (ref 1.5–6.5)
NEUT%: 52.1 % (ref 39.6–80.0)
Platelets: 343 10*3/uL (ref 145–400)
RBC: 4.01 10*6/uL (ref 3.70–5.32)
RDW: 15.4 % (ref 11.1–15.7)
WBC: 6.4 10*3/uL (ref 3.9–10.0)

## 2017-04-26 LAB — CMP (CANCER CENTER ONLY)
ALT(SGPT): 16 U/L (ref 10–47)
AST: 35 U/L (ref 11–38)
Albumin: 3.5 g/dL (ref 3.3–5.5)
Alkaline Phosphatase: 124 U/L — ABNORMAL HIGH (ref 26–84)
BUN, Bld: 25 mg/dL — ABNORMAL HIGH (ref 7–22)
CO2: 27 mEq/L (ref 18–33)
Calcium: 9.9 mg/dL (ref 8.0–10.3)
Chloride: 108 mEq/L (ref 98–108)
Creat: 1.5 mg/dl — ABNORMAL HIGH (ref 0.6–1.2)
Glucose, Bld: 97 mg/dL (ref 73–118)
Potassium: 4.1 mEq/L (ref 3.3–4.7)
Sodium: 144 mEq/L (ref 128–145)
Total Bilirubin: 0.6 mg/dl (ref 0.20–1.60)
Total Protein: 8 g/dL (ref 6.4–8.1)

## 2017-04-26 LAB — IRON AND TIBC
%SAT: 32 % (ref 21–57)
Iron: 83 ug/dL (ref 41–142)
TIBC: 258 ug/dL (ref 236–444)
UIBC: 175 ug/dL (ref 120–384)

## 2017-04-26 LAB — LACTATE DEHYDROGENASE: LDH: 223 U/L (ref 125–245)

## 2017-04-26 LAB — CEA (IN HOUSE-CHCC): CEA (CHCC-In House): 8.44 ng/mL — ABNORMAL HIGH (ref 0.00–5.00)

## 2017-04-26 LAB — FERRITIN: Ferritin: 103 ng/ml (ref 9–269)

## 2017-04-26 MED ORDER — SODIUM CHLORIDE 0.9% FLUSH
10.0000 mL | INTRAVENOUS | Status: DC | PRN
  Administered 2017-04-26: 10 mL via INTRAVENOUS
  Filled 2017-04-26: qty 10

## 2017-04-26 MED ORDER — HEPARIN SOD (PORK) LOCK FLUSH 100 UNIT/ML IV SOLN
500.0000 [IU] | Freq: Once | INTRAVENOUS | Status: AC
  Administered 2017-04-26: 500 [IU] via INTRAVENOUS
  Filled 2017-04-26: qty 5

## 2017-04-26 NOTE — Progress Notes (Signed)
Hematology and Oncology Follow Up Visit  Melanie [name] [id number] 1934/11/23 82 y.o. 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  I am very impressed with how well she looks. She had her colostomy reversal a couple weeks ago. She got through this very well. She said that her surgeon, Dr. Truddie Hidden, did a liver biopsy and at this was normal.  The PET scan clearly shows that this tumor is malignant.  It is growing.  It is with a higher SUV.  Given that this is a solitary liver lesion, I think that we can treated with RFA.  I think that it is still within size for RFA to work.  I talked to Melanie [name] and her sister.  I told him about the RFA procedure.  I felt that this would be very appropriate for Melanie [name].  She did not tolerate systemic chemotherapy well at all.  Her performance status has improved nicely.  Her albumin is come up nicely.  As such, I think that she would definitely tolerate the treatment.  She is in agreement to having this done.  We will get her back here in 3 more months.  I am so happy that her hemoglobin has improved and she is no longer anemic.  I spent about 35 minutes with she and her sister.  I answered all their questions.  Overall, her performance status is ECOG 1.  ations:  Allergies as of 04/26/2017   No Known Allergies     Medication List        Accurate as of 04/26/17  9:33 AM. Always use your most recent med list.          calcium carbonate 1500 (600 Ca) MG Tabs tablet Commonly known as:  OSCAL Take 600 mg of elemental calcium by mouth daily.   levothyroxine 25 MCG tablet Commonly known as:  SYNTHROID, LEVOTHROID Take 25 mcg by mouth daily before  breakfast.   lidocaine-prilocaine cream Commonly known as:  EMLA Apply 1 application topically as needed.   metoprolol tartrate 25 MG tablet Commonly known as:  LOPRESSOR Take 50 mg by mouth 2 (two) times daily.   multivitamin with minerals Tabs tablet Take 1 tablet by mouth daily.   potassium chloride SA 20 MEQ tablet Commonly known as:  K-DUR,KLOR-CON Take 1 tablet (20 mEq total) by mouth daily.   prochlorperazine 10 MG tablet Commonly known as:  COMPAZINE Take 1 tablet (10 mg total) by mouth every 6 (six) hours as needed for nausea or vomiting.   triamterene-hydrochlorothiazide 37.5-25 MG tablet Commonly known as:  MAXZIDE-25 Take 1 pill daily for 3 days and then take daily as needed for fluid in the legs       Allergies: No Known Allergies  Past Medical History, Surgical history, Social history, and Family History were reviewed and updated.  Review of Systems: As stated in the interim history  Physical Exam:  weight is 168 lb (76.2 kg). Her oral temperature is 98.8 F (37.1 C). Her blood pressure is 190/84 (abnormal) and her pulse is 58 (abnormal). Her oxygen saturation is 99%.   Wt Readings from Last 3 Encounters:  04/26/17 168 lb (76.2 kg)  02/01/17 168 lb 6 oz (76.4 kg)  12/27/16 140 lb 4 oz (63.6 kg)     Well-developed and well-nourished African-American female.  She is elderly.  She looks much younger than her stated age.  Head and neck exam shows no ocular or oral lesions.  There are no palpable cervical or supraclavicular lymph nodes.  Lungs are clear bilaterally.  Cardiac exam regular rate and rhythm with no murmurs, rubs or bruits.  Abdomen is soft.  She has well-healed laparoscopy scars.  She has had no fluid wave.  There is no guarding or rebound tenderness.  She has no palpable abdominal mass.  There is no palpable liver or spleen tip.  Extremities shows no clubbing, cyanosis or edema.  Skin exam shows no rashes, ecchymoses or petechia.  Lab Results    Component Value Date   WBC 6.4 04/26/2017   HGB 12.0 04/26/2017   HCT 35.8 04/26/2017   MCV 89 04/26/2017   PLT 343 04/26/2017   Lab Results  Component Value Date   FERRITIN 151 02/01/2017   IRON 43 02/01/2017   TIBC 193 (L) 02/01/2017   UIBC 149 02/01/2017   IRONPCTSAT 23 02/01/2017   Lab Results  Component Value Date   RBC 4.01 04/26/2017   No results found for: KPAFRELGTCHN, LAMBDASER, KAPLAMBRATIO No results found for: IGGSERUM, IGA, IGMSERUM No results found for: Odetta Pink, SPEI   Chemistry      Component Value Date/Time   NA 144 04/26/2017 0830   NA 137 09/30/2016 0940   K 4.1 04/26/2017 0830   K 4.7 09/30/2016 0940   CL 108 04/26/2017 0830   CO2 27 04/26/2017 0830   CO2 18 (L) 09/30/2016 0940   BUN 25 (H) 04/26/2017 0830   BUN 21.5 09/30/2016 0940   CREATININE 1.5 (H) 04/26/2017 0830   CREATININE 1.8 (H) 09/30/2016 0940      Component Value Date/Time   CALCIUM 9.9 04/26/2017 0830   CALCIUM 10.4 09/30/2016 0940   ALKPHOS 124 (H) 04/26/2017 0830   ALKPHOS 147 09/30/2016 0940   AST 35 04/26/2017 0830   AST 27 09/30/2016 0940   ALT 16 04/26/2017 0830   ALT 11 09/30/2016 0940   BILITOT 0.60 04/26/2017 0830   BILITOT 0.34 09/30/2016 0940      Impression and Plan: Melanie [name] is a very pleasant 82 year old African-American female. She initially present with stage IIIB adenocarcinoma of the descending colon. We tried her on adjuvant chemotherapy which she tolerated very poorly.  Hopefully, interventional radiology will be able to do RFA for this lesion.  I want to try to avoid systemic therapy.  She really has a very poor tolerance to chemotherapy.  She showed this we try to treat her in the adjuvant setting.  Again we will see her back in 3 months.  She has a Port-A-Cath.  We will have to make sure this is flushed in 6 weeks.  Maybe, this will be done when she has her procedure.    Melanie Napoleon, MD 11/28/20189:33 AM

## 2017-04-26 NOTE — Patient Instructions (Signed)
Implanted Port Insertion, Care After °This sheet gives you information about how to care for yourself after your procedure. Your health care provider may also give you more specific instructions. If you have problems or questions, contact your health care provider. °What can I expect after the procedure? °After your procedure, it is common to have: °· Discomfort at the port insertion site. °· Bruising on the skin over the port. This should improve over 3-4 days. ° °Follow these instructions at home: °Port care °· After your port is placed, you will get a manufacturer's information card. The card has information about your port. Keep this card with you at all times. °· Take care of the port as told by your health care provider. Ask your health care provider if you or a family member can get training for taking care of the port at home. A home health care nurse may also take care of the port. °· Make sure to remember what type of port you have. °Incision care °· Follow instructions from your health care provider about how to take care of your port insertion site. Make sure you: °? Wash your hands with soap and water before you change your bandage (dressing). If soap and water are not available, use hand sanitizer. °? Change your dressing as told by your health care provider. °? Leave stitches (sutures), skin glue, or adhesive strips in place. These skin closures may need to stay in place for 2 weeks or longer. If adhesive strip edges start to loosen and curl up, you may trim the loose edges. Do not remove adhesive strips completely unless your health care provider tells you to do that. °· Check your port insertion site every day for signs of infection. Check for: °? More redness, swelling, or pain. °? More fluid or blood. °? Warmth. °? Pus or a bad smell. °General instructions °· Do not take baths, swim, or use a hot tub until your health care provider approves. °· Do not lift anything that is heavier than 10 lb (4.5  kg) for a week, or as told by your health care provider. °· Ask your health care provider when it is okay to: °? Return to work or school. °? Resume usual physical activities or sports. °· Do not drive for 24 hours if you were given a medicine to help you relax (sedative). °· Take over-the-counter and prescription medicines only as told by your health care provider. °· Wear a medical alert bracelet in case of an emergency. This will tell any health care providers that you have a port. °· Keep all follow-up visits as told by your health care provider. This is important. °Contact a health care provider if: °· You cannot flush your port with saline as directed, or you cannot draw blood from the port. °· You have a fever or chills. °· You have more redness, swelling, or pain around your port insertion site. °· You have more fluid or blood coming from your port insertion site. °· Your port insertion site feels warm to the touch. °· You have pus or a bad smell coming from the port insertion site. °Get help right away if: °· You have chest pain or shortness of breath. °· You have bleeding from your port that you cannot control. °Summary °· Take care of the port as told by your health care provider. °· Change your dressing as told by your health care provider. °· Keep all follow-up visits as told by your health care provider. °  This information is not intended to replace advice given to you by your health care provider. Make sure you discuss any questions you have with your health care provider. °Document Released: 03/06/2013 Document Revised: 04/06/2016 Document Reviewed: 04/06/2016 °Elsevier Interactive Patient Education © 2017 Elsevier Inc. ° °

## 2017-04-27 ENCOUNTER — Ambulatory Visit
Admission: RE | Admit: 2017-04-27 | Discharge: 2017-04-27 | Disposition: A | Discharge status: Home / Self Care | Payer: Medicare Other | Source: Ambulatory Visit | Attending: Hematology & Oncology | Admitting: Hematology & Oncology

## 2017-04-27 DIAGNOSIS — C186 Malignant neoplasm of descending colon: Secondary | ICD-10-CM

## 2017-04-27 HISTORY — PX: IR RADIOLOGIST EVAL & MGMT: IMG5224

## 2017-04-27 LAB — ERYTHROPOIETIN: Erythropoietin: 4.2 m[IU]/mL (ref 2.6–18.5)

## 2017-04-27 NOTE — Consult Note (Signed)
Chief Complaint: Patient was seen in consultation today for treatment of liver metastasis at the request of Ennever,Peter R  Referring Physician(s): Ennever,Peter R  History of Present Illness: Melanie [name] is a 82 y.o. 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 Her initial surgery was performed in a [place]. She then transferred her oncologic care to Dr. Marin Olp as her sister lives in [place]. She started chemotherapy in May and received FOLFOX. This had to be discontinued during her third cycle due to poor tolerance with fatigue, weakness, lack of appetite, dehydration and weight loss.  MRI on 12/09/2016 demonstrated a 1.6 cm metastasis in segment 8 of the right lobe and potentially an 8 mm metastasis in segment 4A of the left lobe. PET scan on 01/25/2017 demonstrated only the segment 8 lesion which demonstrated increased metabolic activity with SUV of 9. The lesion was approximately 2-2.5 cm on the CT images obtained at the time of that PET scan. Additional follow-up PET scan on 04/24/2017 demonstrates enlargement of the lesion up to approximately 3.5 cm with SUV of 10. There is no evidence of a growing metastasis in the left lobe of the liver on follow-up PET scan.  The patient's appetite has been good off of chemotherapy and she has regained the weight that she lost during chemotherapy. She currently is asymptomatic and does not complain of abdominal pain.  Past Medical History:  Diagnosis Date  . Colon cancer ([place])    colon  . Essential hypertension   . Goals of care, counseling/discussion 09/30/2016  . Hypothyroidism     Past Surgical History:  Procedure Laterality Date  . COLON SURGERY  07/2016   colostomy  . NEPHRECTOMY  07/2016  . SPLENECTOMY  07/2016    Allergies: Patient has no known allergies.  Medications: Prior to  Admission medications   Medication Sig Start Date End Date Taking? Authorizing Provider  calcium carbonate (OSCAL) 1500 (600 Ca) MG TABS tablet Take 600 mg of elemental calcium by mouth daily.     [provider]  levothyroxine (SYNTHROID, LEVOTHROID) 25 MCG tablet Take 25 mcg by mouth daily before breakfast.     [provider]  lidocaine-prilocaine (EMLA) cream Apply 1 application topically as needed. Patient taking differently: Apply 1 application topically once as needed (prior to port access).  10/10/16   [name], MD  metoprolol tartrate (LOPRESSOR) 25 MG tablet Take 50 mg by mouth 2 (two) times daily.     [provider]  Multiple Vitamin (MULTIVITAMIN WITH MINERALS) TABS tablet Take 1 tablet by mouth daily.    [provider]  potassium chloride SA (K-DUR,KLOR-CON) 20 MEQ tablet Take 1 tablet (20 mEq total) by mouth daily. 02/01/17   [name], MD  prochlorperazine (COMPAZINE) 10 MG tablet Take 1 tablet (10 mg total) by mouth every 6 (six) hours as needed for nausea or vomiting. 10/11/16   [name], MD  triamterene-hydrochlorothiazide (MAXZIDE-25) 37.5-25 MG tablet Take 1 pill daily for 3 days and then take daily as needed for fluid in the legs 02/01/17   [name], MD     Family History  Problem Relation Age of Onset  . Colon cancer Mother 62       Died last year at 47 years old  . Colon cancer Brother 24    Social History  Socioeconomic History  . Marital status: Unknown    Spouse name: None  . Number of children: None  . Years of education: None  . Highest education level: None  Social Needs  . Financial resource strain: None  . Food insecurity - worry: None  . Food insecurity - inability: None  . Transportation needs - medical: None  . Transportation needs - non-medical: None  Occupational History  . Occupation: retired Therapist, sports carrier  Tobacco Use  . Smoking status: Former Smoker    Packs/day: 1.00     Years: 35.00    Pack years: 35.00    Last attempt to quit: 1993    Years since quitting: 25.9  . Smokeless tobacco: Never Used  Substance and Sexual Activity  . Alcohol use: No  . Drug use: No  . Sexual activity: None  Other Topics Concern  . None  Social History Narrative  . None    ECOG Status: 0 - Asymptomatic  Review of Systems: A 12 point ROS discussed and pertinent positives are indicated in the HPI above.  All other systems are negative.  Review of Systems  Constitutional: Negative.   Respiratory: Negative.   Cardiovascular: Negative.   Gastrointestinal: Negative.   Genitourinary: Negative.   Musculoskeletal: Negative.   Neurological: Negative.     Vital Signs: BP (!) 202/98   Pulse 66   Temp 98.3 F (36.8 C) (Oral)   Resp 14   Ht 5' 4"  (1.626 m)   Wt 168 lb (76.2 kg)   SpO2 98%   BMI 28.84 kg/m   Physical Exam  Constitutional: She is oriented to person, place, and time. She appears well-developed. No distress.  Neck: Neck supple. No JVD present.  Cardiovascular: Normal rate, regular rhythm and normal heart sounds. Exam reveals no gallop and no friction rub.  No murmur heard. Pulmonary/Chest: Effort normal and breath sounds normal. No stridor. No respiratory distress. She has no wheezes. She has no rales.  Abdominal: Soft. Bowel sounds are normal. She exhibits no distension and no mass. There is no tenderness. There is no rebound and no guarding.  Musculoskeletal: She exhibits no edema.  Lymphadenopathy:    She has no cervical adenopathy.  Neurological: She is alert and oriented to person, place, and time.  Skin: She is not diaphoretic.  Vitals reviewed.  Imaging: Nm Pet Image Restag (ps) Skull Base To Thigh  Result Date: 04/24/2017 CLINICAL DATA:  Subsequent treatment strategy for colon carcinoma with liver metastases. EXAM: NUCLEAR MEDICINE PET SKULL BASE TO THIGH TECHNIQUE: 8.6 mCi F-18 FDG was injected intravenously. Full-ring PET imaging was  performed from the skull base to thigh after the radiotracer. CT data was obtained and used for attenuation correction and anatomic localization. FASTING BLOOD GLUCOSE:  Value: 99 mg/dl COMPARISON:  01/25/2017 FINDINGS: NECK: No hypermetabolic lymph nodes or masses. Diffuse goiter with substernal extension which shows diffuse hypermetabolic activity has not significantly changed. CHEST: No hypermetabolic masses or lymphadenopathy. No suspicious pulmonary nodules seen on CT images. ABDOMEN/PELVIS: Ill-defined low-attenuation mass in the lateral right hepatic lobe currently measures 3.6 cm on image 77/4, compared to 2.5 cm previously. This also shows increased FDG uptake, with SUV max of 10.2 compared to 8.6 previously. No other hypermetabolic liver lesions identified. No abnormal hypermetabolic activity within the pancreas, adrenal glands. Stable postop changes from splenectomy. Severe left renal atrophy remains stable. No hypermetabolic lymph nodes in the abdomen or pelvis. Decreased FDG uptake seen at right lower quadrant ileostomy site. SKELETON: No focal  hypermetabolic bone lesions to suggest skeletal metastasis. IMPRESSION: Mild increase in size and hypermetabolic activity of solitary liver metastasis in the right hepatic lobe. No new sites of metastatic disease identified. Stable goiter with diffuse hypermetabolic activity, consistent with chronic thyroiditis. Suggest correlation with thyroid function tests. Electronically Signed   By: Earle Gell M.D.   On: 04/24/2017 10:37    Labs:  CBC: Recent Labs    12/13/16 0755 12/27/16 1022 02/01/17 1054 04/26/17 0830  WBC 9.7 6.9 9.2 6.4  HGB 10.5* 11.0* 8.4* 12.0  HCT 30.5* 33.5* 26.0* 35.8  PLT 263 368 446* 343    COAGS: Recent Labs    12/06/16 1541  INR 1.18    BMP: Recent Labs    12/07/16 0446 12/08/16 0309 12/09/16 0447 12/10/16 0633 12/13/16 0755 12/27/16 1022 02/01/17 1054 04/26/17 0830  NA 134*  --  138 137 136 135 142 144    K 4.0  --  3.4* 3.6 3.2* 3.5 3.2* 4.1  CL 109  --  117* 115* 110* 112* 107 108  CO2 15*  --  15* 18* 18 22 29 27   GLUCOSE 97  --  72 86 98 126* 97 97  BUN 44*  --  22* 17 12 12 15  25*  CALCIUM 8.8*  --  8.0* 8.6* 9.6 9.3 8.8 9.9  CREATININE 1.97* 1.80* 1.39* 1.32* 1.2 1.6* 1.4* 1.5*  GFRNONAA 23* 26* 35* 37*  --   --   --   --   GFRAA 27* 30* 41* 43*  --   --   --   --     LIVER FUNCTION TESTS: Recent Labs    12/13/16 0755 12/27/16 1022 02/01/17 1054 04/26/17 0830  BILITOT 0.60 0.60 0.60 0.60  AST 36 32 31 35  ALT 14 10 12 16   ALKPHOS 154* 142* 137* 124*  PROT 6.3* 6.8 6.0* 8.0  ALBUMIN 2.8* 2.9* 2.5* 3.5    Assessment and Plan:  I met with Melanie [name] and her sister. We reviewed imaging studies. It currently appears that she has a solitary enlarging metastasis within the peripheral aspect of segment 8 of the right lobe abutting the lateral capsule. By PET, I am measuring the maximal diameter of the tumor at closer to 3.2 cm on the unenhanced CT attenuation images. I told Melanie [name] that this should be amenable to percutaneous thermal ablation with the only difficulty being the relative subcapsular interface along the lateral margin of the tumor which may make complete treatment of the capsular margin more difficult. I did raise the possibility of undergoing another MRI study to further characterize the lesion. However, Melanie [name] would like to avoid MRI, if possible, as she did not enjoy the prior MRI scan. I think that treatment can be based on the PET scan alone and at the time of ablation, contrast-enhanced CT could be considered to more definitively localize margins of the metastatic lesion. By PET, it appears that there are no other sites of distant metastatic disease.  After discussion, Melanie [name] would like to pursue percutaneous thermal ablation. I told her that this would be performed under general anesthesia at [facility] and involve an overnight hospital  stay. Based on current scheduling, the December schedule is completely full and we would have to perform ablation in January.  Thank you for this interesting consult.  I greatly enjoyed meeting Melanie [name] and look forward to participating in their care.  A copy of this report was sent  to the requesting provider on this date.  Electronically SignedAletta Edouard T 04/27/2017, 4:26 PM   I spent a total of 40 Minutes in face to face in clinical consultation, greater than 50% of which was counseling/coordinating care for ablation of a liver metastasis from colon carcinoma.

## 2017-04-28 ENCOUNTER — Encounter: Payer: Self-pay | Admitting: *Deleted

## 2017-05-09 ENCOUNTER — Other Ambulatory Visit (HOSPITAL_COMMUNITY): Payer: Self-pay | Admitting: Interventional Radiology

## 2017-05-09 DIAGNOSIS — C787 Secondary malignant neoplasm of liver and intrahepatic bile duct: Secondary | ICD-10-CM

## 2017-05-19 ENCOUNTER — Other Ambulatory Visit: Payer: Self-pay | Admitting: Radiology

## 2017-06-07 ENCOUNTER — Encounter (HOSPITAL_COMMUNITY): Payer: Self-pay

## 2017-06-07 NOTE — Patient Instructions (Addendum)
Your procedure is scheduled on: Wednesday, Jan. 16, 2019   Surgery Time:  12:00PM-3:00PM   Report to [facility] Main  Entrance    Report to Radiology at 10:00 AM    Call this number if you have problems the morning of surgery [phone]   Do not eat food or drink liquids :After Midnight.   Do NOT smoke after Midnight   Take these medicines the morning of surgery with A SIP OF WATER: Levothyroxine, Metoprolol                               You may not have any metal on your body including hair pins, jewelry, and body piercings             Do not wear make-up, lotions, powders, perfumes/cologne, or deodorant             Do not wear nail polish.  Do not shave  48 hours prior to surgery.                Do not bring valuables to the hospital. [place].   Contacts, dentures or bridgework may not be worn into surgery.   Leave suitcase in the car. After surgery it may be brought to your room.     Special Instructions: Bring a copy of your healthcare power of attorney and living will documents         the day of surgery if you haven't scanned them in before.              Please read over the following fact sheets you were given:   [facility] - Preparing for Surgery Before surgery, you can play an important role.  Because skin is not sterile, your skin needs to be as free of germs as possible.  You can reduce the number of germs on your skin by washing with CHG (chlorahexidine gluconate) soap before surgery.  CHG is an antiseptic cleaner which kills germs and bonds with the skin to continue killing germs even after washing. Please DO NOT use if you have an allergy to CHG or antibacterial soaps.  If your skin becomes reddened/irritated stop using the CHG and inform your nurse when you arrive at Short Stay. Do not shave (including legs and underarms) for at least 48 hours prior to the first CHG shower.  You may shave your  face/neck.  Please follow these instructions carefully:  1.  Shower with CHG Soap the night before surgery and the  morning of surgery.  2.  If you choose to wash your hair, wash your hair first as usual with your normal  shampoo.  3.  After you shampoo, rinse your hair and body thoroughly to remove the shampoo.                             4.  Use CHG as you would any other liquid soap.  You can apply chg directly to the skin and wash.  Gently with a scrungie or clean washcloth.  5.  Apply the CHG Soap to your body ONLY FROM THE NECK DOWN.   Do   not use on face/ open  Wound or open sores. Avoid contact with eyes, ears mouth and   genitals (private parts).                       Wash face,  Genitals (private parts) with your normal soap.             6.  Wash thoroughly, paying special attention to the area where your    surgery  will be performed.  7.  Thoroughly rinse your body with warm water from the neck down.  8.  DO NOT shower/wash with your normal soap after using and rinsing off the CHG Soap.                9.  Pat yourself dry with a clean towel.            10.  Wear clean pajamas.            11.  Place clean sheets on your bed the night of your first shower and do not  sleep with pets. Day of Surgery : Do not apply any lotions/deodorants the morning of surgery.  Please wear clean clothes to the hospital/surgery center.  FAILURE TO FOLLOW THESE INSTRUCTIONS MAY RESULT IN THE CANCELLATION OF YOUR SURGERY  PATIENT SIGNATURE_________________________________  NURSE SIGNATURE__________________________________  ________________________________________________________________________  WHAT IS A BLOOD TRANSFUSION? Blood Transfusion Information  A transfusion is the replacement of blood or some of its parts. Blood is made up of multiple cells which provide different functions.  Red blood cells carry oxygen and are used for blood loss replacement.  White blood  cells fight against infection.  Platelets control bleeding.  Plasma helps clot blood.  Other blood products are available for specialized needs, such as hemophilia or other clotting disorders. BEFORE THE TRANSFUSION  Who gives blood for transfusions?   Healthy volunteers who are fully evaluated to make sure their blood is safe. This is blood bank blood. Transfusion therapy is the safest it has ever been in the practice of medicine. Before blood is taken from a donor, a complete history is taken to make sure that person has no history of diseases nor engages in risky social behavior (examples are intravenous drug use or sexual activity with multiple partners). The donor's travel history is screened to minimize risk of transmitting infections, such as malaria. The donated blood is tested for signs of infectious diseases, such as HIV and hepatitis. The blood is then tested to be sure it is compatible with you in order to minimize the chance of a transfusion reaction. If you or a relative donates blood, this is often done in anticipation of surgery and is not appropriate for emergency situations. It takes many days to process the donated blood. RISKS AND COMPLICATIONS Although transfusion therapy is very safe and saves many lives, the main dangers of transfusion include:   Getting an infectious disease.  Developing a transfusion reaction. This is an allergic reaction to something in the blood you were given. Every precaution is taken to prevent this. The decision to have a blood transfusion has been considered carefully by your caregiver before blood is given. Blood is not given unless the benefits outweigh the risks. AFTER THE TRANSFUSION  Right after receiving a blood transfusion, you will usually feel much better and more energetic. This is especially true if your red blood cells have gotten low (anemic). The transfusion raises the level of the red blood cells which carry oxygen, and this usually  causes an energy increase.  The nurse administering the transfusion will monitor you carefully for complications. HOME CARE INSTRUCTIONS  No special instructions are needed after a transfusion. You may find your energy is better. Speak with your caregiver about any limitations on activity for underlying diseases you may have. SEEK MEDICAL CARE IF:   Your condition is not improving after your transfusion.  You develop redness or irritation at the intravenous (IV) site. SEEK IMMEDIATE MEDICAL CARE IF:  Any of the following symptoms occur over the next 12 hours:  Shaking chills.  You have a temperature by mouth above 102 F (38.9 C), not controlled by medicine.  Chest, back, or muscle pain.  People around you feel you are not acting correctly or are confused.  Shortness of breath or difficulty breathing.  Dizziness and fainting.  You get a rash or develop hives.  You have a decrease in urine output.  Your urine turns a dark color or changes to pink, red, or brown. Any of the following symptoms occur over the next 10 days:  You have a temperature by mouth above 102 F (38.9 C), not controlled by medicine.  Shortness of breath.  Weakness after normal activity.  The white part of the eye turns yellow (jaundice).  You have a decrease in the amount of urine or are urinating less often.  Your urine turns a dark color or changes to pink, red, or brown. Document Released: 05/13/2000 Document Revised: 08/08/2011 Document Reviewed: 12/31/2007 [facility] Patient Information 2014 [place], [place].  _______________________________________________________________________

## 2017-06-07 NOTE — Pre-Procedure Instructions (Signed)
The following are in epic: EKG 12/07/16 Last office visit note Dr. Kathlene Cote 04/27/17 Last office visit note Dr. Marin Olp 04/26/17

## 2017-06-12 ENCOUNTER — Other Ambulatory Visit: Payer: Self-pay | Admitting: Radiology

## 2017-06-12 ENCOUNTER — Encounter (HOSPITAL_COMMUNITY): Payer: Self-pay

## 2017-06-12 ENCOUNTER — Encounter (HOSPITAL_COMMUNITY)
Admission: RE | Admit: 2017-06-12 | Discharge: 2017-06-12 | Disposition: A | Discharge status: Home / Self Care | Payer: Medicare Other | Source: Ambulatory Visit | Attending: Interventional Radiology | Admitting: Interventional Radiology

## 2017-06-12 ENCOUNTER — Other Ambulatory Visit: Payer: Self-pay

## 2017-06-12 DIAGNOSIS — D500 Iron deficiency anemia secondary to blood loss (chronic): Secondary | ICD-10-CM | POA: Diagnosis not present

## 2017-06-12 DIAGNOSIS — Z79899 Other long term (current) drug therapy: Secondary | ICD-10-CM | POA: Insufficient documentation

## 2017-06-12 DIAGNOSIS — C787 Secondary malignant neoplasm of liver and intrahepatic bile duct: Principal | ICD-10-CM | POA: Insufficient documentation

## 2017-06-12 DIAGNOSIS — Z87891 Personal history of nicotine dependence: Secondary | ICD-10-CM | POA: Diagnosis not present

## 2017-06-12 DIAGNOSIS — Z9221 Personal history of antineoplastic chemotherapy: Secondary | ICD-10-CM | POA: Insufficient documentation

## 2017-06-12 DIAGNOSIS — E039 Hypothyroidism, unspecified: Secondary | ICD-10-CM | POA: Insufficient documentation

## 2017-06-12 DIAGNOSIS — Z9081 Acquired absence of spleen: Secondary | ICD-10-CM | POA: Diagnosis not present

## 2017-06-12 DIAGNOSIS — M21372 Foot drop, left foot: Secondary | ICD-10-CM | POA: Insufficient documentation

## 2017-06-12 DIAGNOSIS — N183 Chronic kidney disease, stage 3 (moderate): Secondary | ICD-10-CM | POA: Insufficient documentation

## 2017-06-12 DIAGNOSIS — Z85038 Personal history of other malignant neoplasm of large intestine: Secondary | ICD-10-CM | POA: Diagnosis not present

## 2017-06-12 DIAGNOSIS — Z905 Acquired absence of kidney: Secondary | ICD-10-CM | POA: Insufficient documentation

## 2017-06-12 DIAGNOSIS — I129 Hypertensive chronic kidney disease with stage 1 through stage 4 chronic kidney disease, or unspecified chronic kidney disease: Secondary | ICD-10-CM | POA: Diagnosis not present

## 2017-06-12 HISTORY — DX: Colostomy status: Z93.3

## 2017-06-12 HISTORY — DX: Other chronic thyroiditis: E06.5

## 2017-06-12 HISTORY — DX: Unspecified osteoarthritis, unspecified site: M19.90

## 2017-06-12 HISTORY — DX: Nontoxic goiter, unspecified: E04.9

## 2017-06-12 LAB — CBC WITH DIFFERENTIAL/PLATELET
Basophils Absolute: 0 10*3/uL (ref 0.0–0.1)
Basophils Relative: 1 %
Eosinophils Absolute: 0.3 10*3/uL (ref 0.0–0.7)
Eosinophils Relative: 4 %
HCT: 36.4 % (ref 36.0–46.0)
Hemoglobin: 12 g/dL (ref 12.0–15.0)
Lymphocytes Relative: 25 %
Lymphs Abs: 2 10*3/uL (ref 0.7–4.0)
MCH: 29 pg (ref 26.0–34.0)
MCHC: 33 g/dL (ref 30.0–36.0)
MCV: 87.9 fL (ref 78.0–100.0)
Monocytes Absolute: 1.1 10*3/uL — ABNORMAL HIGH (ref 0.1–1.0)
Monocytes Relative: 14 %
Neutro Abs: 4.6 10*3/uL (ref 1.7–7.7)
Neutrophils Relative %: 56 %
Platelets: 368 10*3/uL (ref 150–400)
RBC: 4.14 MIL/uL (ref 3.87–5.11)
RDW: 15.8 % — ABNORMAL HIGH (ref 11.5–15.5)
WBC: 8.1 10*3/uL (ref 4.0–10.5)

## 2017-06-12 LAB — COMPREHENSIVE METABOLIC PANEL
ALT: 16 U/L (ref 14–54)
AST: 41 U/L (ref 15–41)
Albumin: 3.8 g/dL (ref 3.5–5.0)
Alkaline Phosphatase: 172 U/L — ABNORMAL HIGH (ref 38–126)
Anion gap: 6 (ref 5–15)
BUN: 29 mg/dL — ABNORMAL HIGH (ref 6–20)
CO2: 24 mmol/L (ref 22–32)
Calcium: 9.9 mg/dL (ref 8.9–10.3)
Chloride: 108 mmol/L (ref 101–111)
Creatinine, Ser: 1.31 mg/dL — ABNORMAL HIGH (ref 0.44–1.00)
GFR calc Af Amer: 43 mL/min — ABNORMAL LOW (ref >60)
GFR calc non Af Amer: 37 mL/min — ABNORMAL LOW (ref >60)
Glucose, Bld: 90 mg/dL (ref 65–99)
Potassium: 3.9 mmol/L (ref 3.5–5.1)
Sodium: 138 mmol/L (ref 135–145)
Total Bilirubin: 0.8 mg/dL (ref 0.3–1.2)
Total Protein: 7.8 g/dL (ref 6.5–8.1)

## 2017-06-12 LAB — ABO/RH: ABO/RH(D): O POS

## 2017-06-12 LAB — PROTIME-INR
INR: 1.09
Prothrombin Time: 14 seconds (ref 11.4–15.2)

## 2017-06-12 NOTE — Anesthesia Preprocedure Evaluation (Addendum)
Anesthesia Evaluation  Patient identified by MRN, date of birth, ID band Patient awake    Reviewed: Allergy & Precautions, NPO status , Patient's Chart, lab work & pertinent test results  Airway Mallampati: II  TM Distance: >3 FB Neck ROM: Full    Dental no notable dental hx.    Pulmonary neg pulmonary ROS, former smoker,    Pulmonary exam normal breath sounds clear to auscultation       Cardiovascular hypertension, Pt. on medications and Pt. on home beta blockers negative cardio ROS Normal cardiovascular exam Rhythm:Regular Rate:Normal     Neuro/Psych negative neurological ROS  negative psych ROS   GI/Hepatic negative GI ROS, Neg liver ROS,   Endo/Other  negative endocrine ROSHypothyroidism   Renal/GU CRFRenal diseasenegative Renal ROS  negative genitourinary   Musculoskeletal negative musculoskeletal ROS (+) Arthritis , Osteoarthritis,    Abdominal   Peds negative pediatric ROS (+)  Hematology negative hematology ROS (+) anemia ,   Anesthesia Other Findings Cancer of left colon (HCC)  Iron deficiency anemia due to chronic blood loss  SIRS (systemic inflammatory response syndrome) (HCC) Failure to thrive in adult Chronic renal insufficiency, stage 3 (moderate) (HCC) Protein-calorie malnutrition, severe Acute renal failure superimposed on stage 3 chronic kidney disease (HCC) Dehydration Liver lesion    Reproductive/Obstetrics negative OB ROS                            Anesthesia Physical Anesthesia Plan  ASA: III  Anesthesia Plan: General   Post-op Pain Management:    Induction: Intravenous  PONV Risk Score and Plan: 3 and Ondansetron and Treatment may vary due to age or medical condition  Airway Management Planned: Oral ETT and LMA  Additional Equipment:   Intra-op Plan:   Post-operative Plan: Extubation in OR  Informed Consent:   Plan Discussed with: CRNA and  Anesthesiologist  Anesthesia Plan Comments: (Seen in PAT at request of the radiologist. I do not see anything glaring that would prevent her from having her procedure. Repeat EKG was unchanged from prior. )       Anesthesia Quick Evaluation

## 2017-06-12 NOTE — Pre-Procedure Instructions (Signed)
Dr. Lissa Hoard reviewed EKG no new orders received at this time.

## 2017-06-12 NOTE — Pre-Procedure Instructions (Signed)
CMP results 06/12/17 sent to Dr. Kathlene Cote inbox via epic.

## 2017-06-12 NOTE — Pre-Procedure Instructions (Signed)
Dr. Lissa Hoard spoke with Melanie [name] during her pre op appointment.  Ordered repeat EKG no further orders at this time.

## 2017-06-13 ENCOUNTER — Other Ambulatory Visit: Payer: Self-pay | Admitting: Radiology

## 2017-06-14 ENCOUNTER — Observation Stay (HOSPITAL_COMMUNITY)
Admission: RE | Admit: 2017-06-14 | Discharge: 2017-06-15 | Disposition: A | Discharge status: Home / Self Care | Payer: Medicare Other | Source: Ambulatory Visit | Attending: Interventional Radiology | Admitting: Interventional Radiology

## 2017-06-14 ENCOUNTER — Ambulatory Visit (HOSPITAL_COMMUNITY): Payer: Medicare Other | Admitting: Anesthesiology

## 2017-06-14 ENCOUNTER — Encounter (HOSPITAL_COMMUNITY)
Admission: RE | Disposition: A | Discharge status: Home / Self Care | Payer: Self-pay | Source: Ambulatory Visit | Attending: Interventional Radiology

## 2017-06-14 ENCOUNTER — Encounter (HOSPITAL_COMMUNITY): Payer: Self-pay

## 2017-06-14 ENCOUNTER — Other Ambulatory Visit: Payer: Self-pay

## 2017-06-14 ENCOUNTER — Ambulatory Visit (HOSPITAL_COMMUNITY)
Admission: RE | Admit: 2017-06-14 | Discharge: 2017-06-14 | Disposition: A | Discharge status: Home / Self Care | Payer: Medicare Other | Source: Ambulatory Visit | Attending: Interventional Radiology | Admitting: Interventional Radiology

## 2017-06-14 ENCOUNTER — Ambulatory Visit (HOSPITAL_COMMUNITY): Payer: Medicare Other

## 2017-06-14 DIAGNOSIS — C189 Malignant neoplasm of colon, unspecified: Secondary | ICD-10-CM

## 2017-06-14 DIAGNOSIS — Z85038 Personal history of other malignant neoplasm of large intestine: Secondary | ICD-10-CM | POA: Diagnosis not present

## 2017-06-14 DIAGNOSIS — C787 Secondary malignant neoplasm of liver and intrahepatic bile duct: Secondary | ICD-10-CM

## 2017-06-14 DIAGNOSIS — M21372 Foot drop, left foot: Secondary | ICD-10-CM

## 2017-06-14 DIAGNOSIS — I129 Hypertensive chronic kidney disease with stage 1 through stage 4 chronic kidney disease, or unspecified chronic kidney disease: Secondary | ICD-10-CM | POA: Diagnosis not present

## 2017-06-14 DIAGNOSIS — Z01818 Encounter for other preprocedural examination: Secondary | ICD-10-CM

## 2017-06-14 HISTORY — PX: RADIOFREQUENCY ABLATION: SHX2290

## 2017-06-14 LAB — TYPE AND SCREEN
ABO/RH(D): O POS
Antibody Screen: NEGATIVE

## 2017-06-14 SURGERY — RADIO FREQUENCY ABLATION
Anesthesia: General

## 2017-06-14 MED ORDER — FENTANYL CITRATE (PF) 100 MCG/2ML IJ SOLN
INTRAMUSCULAR | Status: AC
  Filled 2017-06-14: qty 2

## 2017-06-14 MED ORDER — FENTANYL CITRATE (PF) 100 MCG/2ML IJ SOLN
25.0000 ug | INTRAMUSCULAR | Status: DC | PRN
  Administered 2017-06-14 (×3): 50 ug via INTRAVENOUS

## 2017-06-14 MED ORDER — PIPERACILLIN-TAZOBACTAM 3.375 G IVPB
3.3750 g | Freq: Once | INTRAVENOUS | Status: AC
  Administered 2017-06-14: 3.375 g via INTRAVENOUS
  Filled 2017-06-14: qty 50

## 2017-06-14 MED ORDER — SENNOSIDES-DOCUSATE SODIUM 8.6-50 MG PO TABS: 1.0000 | ORAL_TABLET | Freq: Every day | ORAL | Status: DC | PRN

## 2017-06-14 MED ORDER — LIDOCAINE 2% (20 MG/ML) 5 ML SYRINGE
INTRAMUSCULAR | Status: DC | PRN
  Administered 2017-06-14: 100 mg via INTRAVENOUS

## 2017-06-14 MED ORDER — FENTANYL CITRATE (PF) 100 MCG/2ML IJ SOLN
INTRAMUSCULAR | Status: DC | PRN
  Administered 2017-06-14 (×3): 50 ug via INTRAVENOUS

## 2017-06-14 MED ORDER — DOCUSATE SODIUM 100 MG PO CAPS
100.0000 mg | ORAL_CAPSULE | Freq: Two times a day (BID) | ORAL | Status: DC
  Administered 2017-06-14 – 2017-06-15 (×2): 100 mg via ORAL
  Filled 2017-06-14 (×2): qty 1

## 2017-06-14 MED ORDER — EPHEDRINE SULFATE-NACL 50-0.9 MG/10ML-% IV SOSY
PREFILLED_SYRINGE | INTRAVENOUS | Status: DC | PRN
  Administered 2017-06-14 (×2): 5 mg via INTRAVENOUS

## 2017-06-14 MED ORDER — MIDAZOLAM HCL 2 MG/2ML IJ SOLN
INTRAMUSCULAR | Status: AC
  Filled 2017-06-14: qty 2

## 2017-06-14 MED ORDER — FERROUS SULFATE 325 (65 FE) MG PO TABS
325.0000 mg | ORAL_TABLET | Freq: Every day | ORAL | Status: DC
  Administered 2017-06-15: 325 mg via ORAL
  Filled 2017-06-14: qty 1

## 2017-06-14 MED ORDER — SUGAMMADEX SODIUM 200 MG/2ML IV SOLN
INTRAVENOUS | Status: DC | PRN
  Administered 2017-06-14: 200 mg via INTRAVENOUS

## 2017-06-14 MED ORDER — METOPROLOL TARTRATE 50 MG PO TABS
50.0000 mg | ORAL_TABLET | Freq: Two times a day (BID) | ORAL | Status: DC
  Administered 2017-06-14 – 2017-06-15 (×2): 50 mg via ORAL
  Filled 2017-06-14 (×2): qty 1

## 2017-06-14 MED ORDER — PROPOFOL 10 MG/ML IV BOLUS
INTRAVENOUS | Status: DC | PRN
  Administered 2017-06-14: 100 mg via INTRAVENOUS

## 2017-06-14 MED ORDER — HYDROCODONE-ACETAMINOPHEN 5-325 MG PO TABS: 1.0000 | ORAL_TABLET | ORAL | Status: DC | PRN

## 2017-06-14 MED ORDER — HYDROMORPHONE HCL 1 MG/ML IJ SOLN
1.0000 mg | INTRAMUSCULAR | Status: DC | PRN
Start: 2017-06-14 — End: 2017-06-15
  Administered 2017-06-14: 1 mg via INTRAVENOUS
  Filled 2017-06-14: qty 1

## 2017-06-14 MED ORDER — FENTANYL CITRATE (PF) 100 MCG/2ML IJ SOLN: INTRAMUSCULAR | Status: DC | PRN

## 2017-06-14 MED ORDER — POTASSIUM CHLORIDE CRYS ER 20 MEQ PO TBCR
20.0000 meq | EXTENDED_RELEASE_TABLET | Freq: Every day | ORAL | Status: DC
  Administered 2017-06-15: 20 meq via ORAL
  Filled 2017-06-14: qty 1

## 2017-06-14 MED ORDER — MIDAZOLAM HCL 2 MG/2ML IJ SOLN
1.0000 mg | Freq: Once | INTRAMUSCULAR | Status: AC
  Administered 2017-06-14: 1 mg via INTRAVENOUS

## 2017-06-14 MED ORDER — LACTATED RINGERS IV SOLN
INTRAVENOUS | Status: DC
  Administered 2017-06-14 (×2): via INTRAVENOUS

## 2017-06-14 MED ORDER — LEVOTHYROXINE SODIUM 25 MCG PO TABS
25.0000 ug | ORAL_TABLET | Freq: Every day | ORAL | Status: DC
  Administered 2017-06-15: 25 ug via ORAL
  Filled 2017-06-14: qty 1

## 2017-06-14 MED ORDER — ONDANSETRON HCL 4 MG/2ML IJ SOLN: 4.0000 mg | Freq: Four times a day (QID) | INTRAMUSCULAR | Status: DC | PRN

## 2017-06-14 MED ORDER — MEPERIDINE HCL 50 MG/ML IJ SOLN: 6.2500 mg | INTRAMUSCULAR | Status: DC | PRN

## 2017-06-14 MED ORDER — ONDANSETRON HCL 4 MG/2ML IJ SOLN
INTRAMUSCULAR | Status: DC | PRN
  Administered 2017-06-14: 4 mg via INTRAVENOUS

## 2017-06-14 MED ORDER — PROCHLORPERAZINE MALEATE 10 MG PO TABS: 10.0000 mg | ORAL_TABLET | Freq: Four times a day (QID) | ORAL | Status: DC | PRN

## 2017-06-14 MED ORDER — SODIUM CHLORIDE 0.9 % IV SOLN
INTRAVENOUS | Status: DC
  Administered 2017-06-14: 19:00:00 via INTRAVENOUS

## 2017-06-14 MED ORDER — ROCURONIUM BROMIDE 10 MG/ML (PF) SYRINGE
PREFILLED_SYRINGE | INTRAVENOUS | Status: DC | PRN
  Administered 2017-06-14: 20 mg via INTRAVENOUS
  Administered 2017-06-14: 10 mg via INTRAVENOUS
  Administered 2017-06-14: 50 mg via INTRAVENOUS

## 2017-06-14 NOTE — Transfer of Care (Signed)
Immediate Anesthesia Transfer of Care Note  Patient: Melanie [name]  Procedure(s) Performed: CT MICROWAVE THERMAL ABLATION (N/A )  Patient Location: PACU  Anesthesia Type:General  Level of Consciousness: awake  Airway & Oxygen Therapy: Patient Spontanous Breathing and Patient connected to face mask oxygen  Post-op Assessment: Report given to RN and Post -op Vital signs reviewed and stable  Post vital signs: Reviewed and stable  Last Vitals:  Vitals:   06/14/17 1125  BP: (!) 156/79  Pulse: 66  Resp: 16  SpO2: 94%    Last Pain: There were no vitals filed for this visit.       Complications: No apparent anesthesia complications

## 2017-06-14 NOTE — Anesthesia Postprocedure Evaluation (Signed)
Anesthesia Post Note  Patient: Melanie [name]  Procedure(s) Performed: CT MICROWAVE THERMAL ABLATION (N/A )     Patient location during evaluation: PACU Anesthesia Type: General Level of consciousness: awake and alert Pain management: pain level controlled Vital Signs Assessment: post-procedure vital signs reviewed and stable Respiratory status: spontaneous breathing, nonlabored ventilation, respiratory function stable and patient connected to nasal cannula oxygen Cardiovascular status: blood pressure returned to baseline and stable Postop Assessment: no apparent nausea or vomiting Anesthetic complications: no    Last Vitals:  Vitals:   06/14/17 1530 06/14/17 1545  BP: (!) 209/93 (!) 195/89  Pulse: 63 64  Resp: 18 18  Temp:    SpO2: 99% 94%    Last Pain:  Vitals:   06/14/17 1545  PainSc: 8                  Uzoma Vivona

## 2017-06-14 NOTE — Progress Notes (Signed)
Patient a little drowsy, still in PACU; arousable, does answer questions.  Complaining of some mild right shoulder discomfort most likely secondary to liver capsular irritation from ablation Vital signs stable, afebrile Puncture sites right upper quadrant abd clean and dry, dressing intact, not significantly tender.  Abdomen soft.  Assessment/plan: Status post CT and ultrasound-guided microwave thermal ablation of right hepatic lobe metastatsis from colon CA; for overnight obs; check a.m. CBC/CMP; DC Foley catheter later this evening; follow-up with Dr. Kathlene Cote in the [place] clinic in 4 weeks   Rowe Robert, [facility]

## 2017-06-14 NOTE — H&P (Signed)
Referring Physician(s): Ennever,P  Supervising Physician: Aletta Edouard  Patient Status:  [facility] OP TBA  Chief Complaint: Metastatic colon cancer to liver   Subjective: Patient familiar to IR service from recent consultation with Dr. Kathlene Cote on 04/27/17 to discuss treatment options for metastatic colon cancer to liver.  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  She has also undergone chemotherapy.  Recent PET scan reveals mild increase in size and hypermetabolic activity of a solitary liver metastasis in segment 8.  Following discussion with Dr. Kathlene Cote she was deemed an appropriate candidate for CT-guided thermal ablation of this liver lesion and presents today for the procedure.  She currently denies fever, headache, chest pain, dyspnea, cough, abdominal/back pain, nausea, vomiting or bleeding.  She is anxious.  Past Medical History:  Diagnosis Date  . Arthritis   . Chronic thyroiditis   . Colon cancer ([place])    colon  . Colostomy in place [facility])    history of, no longer in place  . Essential hypertension   . Goals of care, counseling/discussion 09/30/2016  . Goiter   . Hypothyroidism   CHRONIC RENAL DISEASE Past Surgical History:  Procedure Laterality Date  . COLON SURGERY  07/2016   colostomy  . COLONOSCOPY    . COLOSTOMY REVERSAL    . IR RADIOLOGIST EVAL & MGMT  04/27/2017  . NEPHRECTOMY  07/2016  . PORTA CATH INSERTION    . SPLENECTOMY  07/2016    Allergies: Patient has no known allergies.  Medications: Prior to Admission medications   Medication Sig Start Date End Date Taking? Authorizing Provider  Calcium Carbonate-Vitamin D (CALTRATE 600+D PO) Take 1 tablet by mouth at bedtime.   Yes [provider]  Cyanocobalamin (VITAMIN B-12 PO) Take 1 tablet by mouth daily.   Yes [provider]  ferrous sulfate 325 (65 FE) MG tablet Take 325 mg by mouth daily with breakfast.    Yes [provider]  levothyroxine (SYNTHROID, LEVOTHROID) 25 MCG tablet Take 25 mcg by mouth daily before breakfast.    Yes [provider]  lidocaine-prilocaine (EMLA) cream Apply 1 application topically as needed. Patient taking differently: Apply 1 application topically once as needed (prior to port access).  10/10/16  Yes [name], MD  metoprolol tartrate (LOPRESSOR) 25 MG tablet Take 50 mg by mouth 2 (two) times daily.    Yes [provider]  Multiple Vitamin (MULTIVITAMIN WITH MINERALS) TABS tablet Take 1 tablet by mouth daily.   Yes [provider]  potassium chloride SA (K-DUR,KLOR-CON) 20 MEQ tablet Take 1 tablet (20 mEq total) by mouth daily. 02/01/17  Yes [name], MD  prochlorperazine (COMPAZINE) 10 MG tablet Take 1 tablet (10 mg total) by mouth every 6 (six) hours as needed for nausea or vomiting. 10/11/16  Yes Ennever, Rudell Cobb, MD  triamterene-hydrochlorothiazide (MAXZIDE-25) 37.5-25 MG tablet Take 1 pill daily for 3 days and then take daily as needed for fluid in the legs Patient not taking: Reported on 06/02/2017 02/01/17   [name], MD     Vital Signs:pend   Physical Exam awake, alert.  Chest clear to auscultation bilaterally.  Clean, intact right chest wall Port-A-Cath.  Heart with regular rate and rhythm.  Abdomen soft, positive bowel sounds, nontender.  No lower extremity edema.  Imaging: No results found.  Labs:  CBC: Recent Labs    12/27/16 1022 02/01/17 1054 04/26/17 0830 06/12/17 1140  WBC 6.9 9.2 6.4 8.1  HGB 11.0* 8.4* 12.0 12.0  HCT 33.5* 26.0* 35.8 36.4  PLT 368 446* 343 368    COAGS: Recent Labs    12/06/16 1541 06/12/17 1140  INR 1.18 1.09    BMP: Recent Labs    12/08/16 0309 12/09/16 0447 12/10/16 0633  12/27/16 1022 02/01/17 1054 04/26/17 0830 06/12/17 1140  NA  --  138 137   < > 135 142 144 138  K  --  3.4* 3.6   < > 3.5 3.2* 4.1 3.9  CL  --  117* 115*   < > 112* 107 108 108    CO2  --  15* 18*   < > 22 29 27 24   GLUCOSE  --  72 86   < > 126* 97 97 90  BUN  --  22* 17   < > 12 15 25* 29*  CALCIUM  --  8.0* 8.6*   < > 9.3 8.8 9.9 9.9  CREATININE 1.80* 1.39* 1.32*   < > 1.6* 1.4* 1.5* 1.31*  GFRNONAA 26* 35* 37*  --   --   --   --  37*  GFRAA 30* 41* 43*  --   --   --   --  43*   < > = values in this interval not displayed.    LIVER FUNCTION TESTS: Recent Labs    12/27/16 1022 02/01/17 1054 04/26/17 0830 06/12/17 1140  BILITOT 0.60 0.60 0.60 0.8  AST 32 31 35 41  ALT 10 12 16 16   ALKPHOS 142* 137* 124* 172*  PROT 6.8 6.0* 8.0 7.8  ALBUMIN 2.9* 2.5* 3.5 3.8    Assessment and Plan:  Pt with hx stage IIIB adenocarcinoma of the descending colon diagnosed  last year with surgery performed in [place].  She has also undergone chemotherapy.  Recent PET scan reveals mild increase in size and hypermetabolic activity of a solitary liver metastasis in segment 8.  Following consultation with Dr. Kathlene Cote on 04/27/17 she was deemed an appropriate candidate for CT-guided thermal ablation of this liver lesion and presents today for the procedure.  Details/risk of procedure, including but not limited to, internal bleeding, infection, injury to adjacent structures, anesthesia related complications discussed with patient with her understanding and consent.  Post procedure she will be admitted for overnight observation.This procedure involves the use of X-rays and because of the nature of the planned procedure, it is possible that we will have prolonged use of CT.  Potential radiation risks to you include (but are not limited to) the following: - A slightly elevated risk for cancer  several years later in life. This risk is typically less than 0.5% percent. This risk is low in comparison to the normal incidence of human cancer, which is 33% for women and 50% for men according to the [place]. - Radiation induced injury can include skin redness, resembling a  rash, tissue breakdown / ulcers and hair loss (which can be temporary or permanent).   The likelihood of either of these occurring depends on the difficulty of the procedure and whether you are sensitive to radiation due to previous procedures, disease, or genetic conditions.   IF your procedure requires a prolonged use of radiation, you will be notified and given written instructions for further action.  It is your responsibility to monitor the irradiated area for the 2 weeks following the procedure and to notify your physician if you are concerned that you have suffered  a radiation induced injury.       Electronically Signed: D. Rowe Robert, PA-C 06/14/2017, 9:57 AM   I spent a total of 30 minutes at the the patient's bedside AND on the patient's hospital floor or unit, greater than 50% of which was counseling/coordinating care for CT-guided thermal ablation of liver lesion

## 2017-06-14 NOTE — Anesthesia Procedure Notes (Signed)
Procedure Name: Intubation Date/Time: 06/14/2017 12:34 PM Performed by: Sharlette Dense, CRNA Patient Re-evaluated:Patient Re-evaluated prior to induction Oxygen Delivery Method: Circle system utilized Preoxygenation: Pre-oxygenation with 100% oxygen Induction Type: IV induction Ventilation: Mask ventilation without difficulty and Oral airway inserted - appropriate to patient size Laryngoscope Size: Sabra Heck and 2 Grade View: Grade I Tube type: Oral Tube size: 7.5 mm Number of attempts: 1 Airway Equipment and Method: Stylet Placement Confirmation: ETT inserted through vocal cords under direct vision,  positive ETCO2 and breath sounds checked- equal and bilateral Secured at: 21 cm Tube secured with: Tape Dental Injury: Teeth and Oropharynx as per pre-operative assessment

## 2017-06-14 NOTE — Procedures (Signed)
Interventional Radiology Procedure Note  Procedure: CT and US guided microwave thermal ablation of liver metastasis  Anesthesia: General  Complications: None  Estimated Blood Loss: < 10 mL  Findings: Right lobe lateral subcapsular lesion measures approximately 3.5 cm by [place]. Ablation performed with 2 NeuWave PR probes advanced along edges of mass in angulated fashion. After 5 min cycle at 65 W, probes retracted 2 cm and additional 4 min of ablation performed.  Plan: Overnight observation.  Venetia Night. Kathlene Cote, M.D Pager:  [phone]

## 2017-06-15 ENCOUNTER — Encounter (HOSPITAL_COMMUNITY): Payer: Self-pay | Admitting: Interventional Radiology

## 2017-06-15 DIAGNOSIS — M21372 Foot drop, left foot: Secondary | ICD-10-CM | POA: Clinically undetermined

## 2017-06-15 DIAGNOSIS — C189 Malignant neoplasm of colon, unspecified: Secondary | ICD-10-CM | POA: Diagnosis not present

## 2017-06-15 DIAGNOSIS — C787 Secondary malignant neoplasm of liver and intrahepatic bile duct: Secondary | ICD-10-CM

## 2017-06-15 LAB — CBC
HCT: 32.3 % — ABNORMAL LOW (ref 36.0–46.0)
Hemoglobin: 10.7 g/dL — ABNORMAL LOW (ref 12.0–15.0)
MCH: 29.2 pg (ref 26.0–34.0)
MCHC: 33.1 g/dL (ref 30.0–36.0)
MCV: 88 fL (ref 78.0–100.0)
Platelets: 261 10*3/uL (ref 150–400)
RBC: 3.67 MIL/uL — ABNORMAL LOW (ref 3.87–5.11)
RDW: 16.3 % — ABNORMAL HIGH (ref 11.5–15.5)
WBC: 10.2 10*3/uL (ref 4.0–10.5)

## 2017-06-15 LAB — COMPREHENSIVE METABOLIC PANEL
ALT: 34 U/L (ref 14–54)
AST: 188 U/L — ABNORMAL HIGH (ref 15–41)
Albumin: 3.4 g/dL — ABNORMAL LOW (ref 3.5–5.0)
Alkaline Phosphatase: 141 U/L — ABNORMAL HIGH (ref 38–126)
Anion gap: 4 — ABNORMAL LOW (ref 5–15)
BUN: 28 mg/dL — ABNORMAL HIGH (ref 6–20)
CO2: 26 mmol/L (ref 22–32)
Calcium: 9.1 mg/dL (ref 8.9–10.3)
Chloride: 108 mmol/L (ref 101–111)
Creatinine, Ser: 1.42 mg/dL — ABNORMAL HIGH (ref 0.44–1.00)
GFR calc Af Amer: 39 mL/min — ABNORMAL LOW (ref >60)
GFR calc non Af Amer: 34 mL/min — ABNORMAL LOW (ref >60)
Glucose, Bld: 122 mg/dL — ABNORMAL HIGH (ref 65–99)
Potassium: 4.2 mmol/L (ref 3.5–5.1)
Sodium: 138 mmol/L (ref 135–145)
Total Bilirubin: 0.3 mg/dL (ref 0.3–1.2)
Total Protein: 7 g/dL (ref 6.5–8.1)

## 2017-06-15 NOTE — Progress Notes (Signed)
Discharge instructions and medications discussed with patient.  AVS given to patient.  All questions answered. Waiting for [employer] to bring AFO to patient's room.

## 2017-06-15 NOTE — Evaluation (Signed)
Physical Therapy Evaluation Patient Details Name: FAELYN SIGLER MRN: [id number] DOB: 20-Aug-1934 Today's Date: 06/15/2017   History of Present Illness  83 yo female admitted for overnight observation for Status post CT and ultrasound-guided microwave thermal ablation of right hepatic lobe metastatsis from colon CA; found to have a left foot drop prior to DC.  Clinical Impression  The patient presents with a left foot drop. Noted to compensate well when using RW but at risk for tripping without UE support. The patient appreciates light touch. Reviewed with patient and sister of risks for ankle injury if not very careful, especially on un level surfaces. The patient may benefit from a  Prefabricated AFO and understands that she will need a sneaker type shoe(she does not have any locally) Pt admitted with above diagnosis. Pt currently with functional limitations due to the deficits listed below (see PT Problem List). Pt will benefit from skilled PT to increase their independence and safety with mobility to allow discharge to the venue listed below.       Follow Up Recommendations Home health PT    Equipment Recommendations  (patient needs RW but has had one covered within last year) Sister will obtain a walker for home. Prefabricated AFO-call [employer]-[phone]   Recommendations for Other Services       Precautions / Restrictions Precautions Precautions: Fall Precaution Comments: left Foot drop      Mobility  Bed Mobility Overal bed mobility: Independent                Transfers Overall transfer level: Needs assistance Equipment used: None Transfers: Sit to/from Stand Sit to Stand: Supervision            Ambulation/Gait Ambulation/Gait assistance: Min guard;Min assist Ambulation Distance (Feet): 100 Feet Assistive device: Rolling walker (2 wheeled);None Gait Pattern/deviations: Steppage;Step-to pattern;Step-through pattern     General Gait Details: with RW, the  patient is more stable.Without UE support, gait is guarded and noted to reach for any support to stabilize.   Stairs            Wheelchair Mobility    Modified Rankin (Stroke Patients Only)       Balance Overall balance assessment: Needs assistance Sitting-balance support: No upper extremity supported;Feet supported Sitting balance-Leahy Scale: Good     Standing balance support: During functional activity;No upper extremity supported Standing balance-Leahy Scale: Poor Standing balance comment: static standing is fair, dynamic  without UE support is poor.                             Pertinent Vitals/Pain Pain Assessment: No/denies pain    Home Living Family/patient expects to be discharged to:: Private residence Living Arrangements: Other relatives Available Help at Discharge: Family;Available 24 hours/day Type of Home: House Home Access: Stairs to enter Entrance Stairs-Rails: Right Entrance Stairs-Number of Steps: 1 Home Layout: One level Home Equipment: Walker - 2 wheels Additional Comments: patient's RW is at her home in [place], staying with sister locally    Prior Function Level of Independence: Independent               Hand Dominance        Extremity/Trunk Assessment   Upper Extremity Assessment Upper Extremity Assessment: Overall WFL for tasks assessed    Lower Extremity Assessment Lower Extremity Assessment: LLE deficits/detail LLE Deficits / Details: anterior tib and eversion not active, able to invert and plantarflex. Appreciates light touch  Cervical / Trunk Assessment Cervical / Trunk Assessment: Normal  Communication   Communication: No difficulties  Cognition Arousal/Alertness: Awake/alert Behavior During Therapy: WFL for tasks assessed/performed Overall Cognitive Status: Within Functional Limits for tasks assessed                                        General Comments      Exercises      Assessment/Plan    PT Assessment Patient needs continued PT services  PT Problem List Decreased strength;Decreased balance;Decreased mobility;Decreased safety awareness       PT Treatment Interventions DME instruction;Gait training;Functional mobility training;Therapeutic activities;Neuromuscular re-education;Therapeutic exercise    PT Goals (Current goals can be found in the Care Plan section)  Acute Rehab PT Goals Patient Stated Goal: to move my foot, go home PT Goal Formulation: With patient/family Time For Goal Achievement: 06/22/17 Potential to Achieve Goals: Good    Frequency Min 2X/week   Barriers to discharge        Co-evaluation               AM-PAC PT "6 Clicks" Daily Activity  Outcome Measure Difficulty turning over in bed (including adjusting bedclothes, sheets and blankets)?: None Difficulty moving from lying on back to sitting on the side of the bed? : None Difficulty sitting down on and standing up from a chair with arms (e.g., wheelchair, bedside commode, etc,.)?: A Little Help needed moving to and from a bed to chair (including a wheelchair)?: A Little Help needed walking in hospital room?: A Little Help needed climbing 3-5 steps with a railing? : A Lot 6 Click Score: 19    End of Session   Activity Tolerance: Patient tolerated treatment well Patient left: in bed;with call bell/phone within reach;with family/visitor present Nurse Communication: Mobility status PT Visit Diagnosis: Unsteadiness on feet (R26.81)    Time: 2423-5361 PT Time Calculation (min) (ACUTE ONLY): 25 min   Charges:   PT Evaluation $PT Eval Low Complexity: 1 Low PT Treatments $Gait Training: 8-22 mins   PT G Codes:   PT G-Codes **NOT FOR INPATIENT CLASS** Functional Assessment Tool Used: AM-PAC 6 Clicks Basic Mobility   [place] [facility] [phone]   Claretha Cooper 06/15/2017, 2:03 PM

## 2017-06-15 NOTE — Consult Note (Signed)
Patient Demographics:    Melanie [name], is a 83 y.o. 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  High steppage gait noted on the left side, patient is unable to do heel walking on the left side, no obvious atrophy or deformity otherwise. Lumbar MRI to rule out metastatic lesions impinging on the peroneal nerve and EMG advised as outpatient, d/w on-call neurologist, the studies on evaluation, done at [facility] outpatient neurology with a neuromuscular specialist within the next 1-2 weeks.  Patient verbalizes understanding of the plan. Patient is to see a neuromuscular neurologist at [facility] neurology (Dr Jannifer Franklin or any available NeuroMuscular Neurologist) for left foot drop evaluation and possible EMG and lumbar MRI   2)Colon Ca with Liver Mets- Metastatic colon cancer to liver, status post CT and ultrasound-guided microwave thermal ablation of right hepatic lobe metastasis via general anesthesia on 06/14/17--- further management by interventional radiology   No evidence of acute stroke at this time, patient's neuro symptoms are limited to left foot and ankle and consistent with left foot drop to.   Thank you Dr. Kathlene Cote for this hospitalist consult patient may be discharged home with above recommendations.     With History of - Reviewed by me  Past Medical History:  Diagnosis Date  . Arthritis   . Chronic thyroiditis   . Colon cancer ([place])     colon  . Colostomy in place [facility])    history of, no longer in place  . Essential hypertension   . Goals of care, counseling/discussion 09/30/2016  . Goiter   . Hypothyroidism       Past Surgical History:  Procedure Laterality Date  . COLON SURGERY  07/2016   colostomy  . COLONOSCOPY    . COLOSTOMY REVERSAL    . IR RADIOLOGIST EVAL & MGMT  04/27/2017  . NEPHRECTOMY  07/2016  . PORTA CATH INSERTION    . RADIOFREQUENCY ABLATION N/A 06/14/2017   Procedure: CT MICROWAVE THERMAL ABLATION;  Surgeon: Aletta Edouard, MD;  Location: [facility] ORS;  Service: Anesthesiology;  Laterality: N/A;  . SPLENECTOMY  07/2016      No chief complaint on file.     HPI:    Melanie [name]  is a 83 y.o. 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  Pt has stage IIIBadenocarcinoma of the descending colon diagnosedlast year with  surgery performed in [place]. She has also undergone chemotherapy. Recent PET scan revealed mild increase in size and hypermetabolic activity of a solitary liver metastasis     Hospitalist consult requested on 06/15/17 by Dr Kathlene Cote due to concerns about inability to move left foot, patient's sister is at bedside.  Patient denies visual disturbance, no headache, no speech problems, no swallowing difficulty, no upper extremity weakness or numbness, no right-sided lower extremity weakness or numbness, her left upper extremity is unremarkable except for the area of the ankle and foot, specifically patient is unable to dorsiflex her foot but she can plantarflex it (can invert, but not evert), subtle sensory loss on the fibular side appears of the left foot and ankle  No  back pain, no radicular symptoms, no urinary or bowel incontinence or retention  Lt LE exam  is consistent with left foot drop due to weakness of the tibialis anterior muscle raising concerns about peroneal nerve injury.  High steppage gait noted on the left side, patient is unable to do heel walking on the left side, no obvious atrophy or deformity otherwise    Review of systems:    In addition to the HPI above,   A full 12 point Review of 10 Systems was done, except as stated above, all other Review of 10 Systems were negative.    Social History:  Reviewed by me    Social History   Tobacco Use  . Smoking status: Former Smoker    Packs/day: 1.00    Years: 35.00    Pack years: 35.00    Last attempt to quit: 1993    Years since quitting: 26.0  . Smokeless tobacco: Never Used  Substance Use Topics  . Alcohol use: No       Family History :  Reviewed by me    Family History  Problem Relation Age of Onset  . Colon cancer Mother 83       Died last year at 24 years old  . Colon cancer Brother 60    Home Medications:   Prior to Admission medications   Medication Sig Start Date End Date Taking? Authorizing Provider  Calcium Carbonate-Vitamin D (CALTRATE 600+D PO) Take 1 tablet by mouth at bedtime.   Yes [provider]  Cyanocobalamin (VITAMIN B-12 PO) Take 1 tablet by mouth daily.   Yes [provider]  ferrous sulfate 325 (65 FE) MG tablet Take 325 mg by mouth daily with breakfast.   Yes [provider]  levothyroxine (SYNTHROID, LEVOTHROID) 25 MCG tablet Take 25 mcg by mouth daily before breakfast.    Yes [provider]  lidocaine-prilocaine (EMLA) cream Apply 1 application topically as needed. Patient taking differently: Apply 1 application topically once as needed (prior to port access).  10/10/16  Yes [name], MD  metoprolol tartrate (LOPRESSOR) 25 MG tablet Take 50 mg by mouth 2 (two) times daily.    Yes [provider]  Multiple Vitamin (MULTIVITAMIN WITH MINERALS) TABS tablet Take 1 tablet by mouth  daily.   Yes [provider]  potassium chloride SA (K-DUR,KLOR-CON) 20 MEQ tablet Take 1 tablet (20 mEq total) by mouth daily. 02/01/17  Yes [name], MD  prochlorperazine (COMPAZINE) 10 MG tablet Take 1 tablet (10 mg total) by mouth every 6 (six) hours as needed for nausea or vomiting. 10/11/16   [name], MD  triamterene-hydrochlorothiazide (MAXZIDE-25) 37.5-25 MG tablet Take 1 pill daily for 3 days and then take daily as  needed for fluid in the legs Patient not taking: Reported on 06/02/2017 02/01/17   [name], MD     Allergies:    No Known Allergies   Physical Exam:   Vitals  Blood pressure (!) 148/73, pulse 70, temperature 99.1 F (37.3 C), temperature source Oral, resp. rate 16, height 5\' 4"  (1.626 m), weight 80.7 kg (178 lb), SpO2 96 %.  Physical Examination: General appearance - alert, well appearing, and in no distress  Mental status - alert, oriented to person, place, and time,  Eyes - sclera anicteric Neck - supple, no JVD elevation , Chest - clear  to auscultation bilaterally, symmetrical air movement,  Heart - S1 and S2 normal,  Abdomen - soft, nontender, nondistended, no masses or organomegaly Neurological - screening mental status exam normal, neck supple without rigidity, cranial nerves II through XII intact, Patient denies visual disturbance, no headache, no speech problems, no swallowing difficulty, no upper extremity weakness or numbness, no right-sided lower extremity weakness or numbness, her left upper extremity is unremarkable except for the area of the ankle and foot, specifically patient is unable to dorsiflex her foot but she can plantarflex it (can invert, but not evert), subtle sensory loss on the fibular side appears of the left foot and ankle, exam is consistent with left foot drop due to weakness of the tibialis anterior muscle raising concerns about peroneal nerve injury.  High steppage gait noted on the left side, patient is unable  to do heel walking on the left side, no obvious atrophy or deformity otherwise Extremities - no pedal edema noted, intact peripheral pulses  Skin - warm, dry    Data Review:    CBC Recent Labs  Lab 06/12/17 1140 06/15/17 0403  WBC 8.1 10.2  HGB 12.0 10.7*  HCT 36.4 32.3*  PLT 368 261  MCV 87.9 88.0  MCH 29.0 29.2  MCHC 33.0 33.1  RDW 15.8* 16.3*  LYMPHSABS 2.0  --   MONOABS 1.1*  --   EOSABS 0.3  --   BASOSABS 0.0  --    ------------------------------------------------------------------------------------------------------------------  Chemistries  Recent Labs  Lab 06/12/17 1140 06/15/17 0403  NA 138 138  K 3.9 4.2  CL 108 108  CO2 24 26  GLUCOSE 90 122*  BUN 29* 28*  CREATININE 1.31* 1.42*  CALCIUM 9.9 9.1  AST 41 188*  ALT 16 34  ALKPHOS 172* 141*  BILITOT 0.8 0.3   ------------------------------------------------------------------------------------------------------------------ estimated creatinine clearance is 32.5 mL/min (A) (by C-G formula based on SCr of 1.42 mg/dL (H)). ------------------------------------------------------------------------------------------------------------------ No results for input(s): TSH, T4TOTAL, T3FREE, THYROIDAB in the last 72 hours.  Invalid input(s): FREET3   Coagulation profile Recent Labs  Lab 06/12/17 1140  INR 1.09   ------------------------------------------------------------------------------------------------------------------- No results for input(s): DDIMER in the last 72 hours. -------------------------------------------------------------------------------------------------------------------  Cardiac Enzymes No results for input(s): CKMB, TROPONINI, MYOGLOBIN in the last 168 hours.  Invalid input(s): CK ------------------------------------------------------------------------------------------------------------------    Component Value Date/Time   BNP 41.1 12/06/2016 1512      ---------------------------------------------------------------------------------------------------------------  Urinalysis    Component Value Date/Time   COLORURINE YELLOW 12/06/2016 1654   APPEARANCEUR CLOUDY (A) 12/06/2016 1654   LABSPEC 1.019 12/06/2016 1654   PHURINE 5.0 12/06/2016 1654   GLUCOSEU NEGATIVE 12/06/2016 1654   HGBUR NEGATIVE 12/06/2016 1654   BILIRUBINUR NEGATIVE 12/06/2016 [place] 12/06/2016 1654   PROTEINUR 30 (A) 12/06/2016 1654   NITRITE NEGATIVE 12/06/2016 1654   LEUKOCYTESUR TRACE (A) 12/06/2016 1654    ----------------------------------------------------------------------------------------------------------------  Imaging Results:    Dg Chest 1 View  Result Date: 06/14/2017 CLINICAL DATA:  Preop, history of colon carcinoma with liver metastases EXAM: CHEST 1 VIEW COMPARISON:  PET scan of 04/24/2017 FINDINGS: No active infiltrate or effusion is seen. Right-sided Port-A-Cath tip overlies the mid SVC. Mediastinal and hilar contours are unremarkable. The heart is mildly enlarged and the descending thoracic aorta is ectatic. No acute bony abnormality is seen. IMPRESSION: 1. No active lung disease.  Mild cardiomegaly. 2. Port-A-Cath tip overlies the mid SVC. Electronically Signed   By: Ivar Drape M.D.   On: 06/14/2017 10:31   Ct Guide Tissue Ablation  Result Date: 06/14/2017 CLINICAL DATA:  Metastasis of right lobe of liver from colon carcinoma. The patient presents for thermal ablation. EXAM: CT-GUIDED PERCUTANEOUS THERMAL ABLATION OF LIVER METASTASIS COMPARISON:  PET scan on 04/24/2017 ANESTHESIA/SEDATION: Anesthesia:  General Medications: 3.375 g IV Zosyn. The antibiotic was administered in an appropriate time interval prior to needle puncture of the skin. CONTRAST:  None. PROCEDURE: The procedure, risks, benefits, and alternatives were explained to the patient. Questions regarding the procedure were encouraged and answered. The patient  understands and consents to the procedure. The patient was placed under general anesthesia. Initial unenhanced CT was performed in a supine position to localize the liver. Ultrasound was also performed to localize the liver lesion in the lateral aspect of segment 8. The right lateral abdominal wall was prepped with chlorhexidine in a sterile fashion, and a sterile drape was applied covering the operative field. A sterile gown and sterile gloves were used for the procedure. Under both ultrasound and CT guidance, 2 separate NeuWave PR thermal ablation probes were advanced into the liver. Probe positioning was confirmed by CT prior to ablation. Thermal ablation was performed through the 2 probes simultaneously. Initial 5 minute cycle of ablation was performed at 65 watts. The probes were then retracted 2 cm and an additional 4 minutes cycle of ablation performed. COMPLICATIONS: None FINDINGS: Subcapsular lesion within the lateral aspect of segment 8 of the right lobe was well visualized by ultrasound and demonstrates oval shaped and is relatively hyperechoic. This measured approximately 3.3 cm. Given subcapsular location, a 2 probe wedge shaped ablation technique was performed along anterior and posterior margins of the tumor. IMPRESSION: CT guided percutaneous thermal ablation of right lobe liver metastasis. The patient will be observed overnight. Initial follow-up will be performed in approximately 4 weeks. Electronically Signed   By: Aletta Edouard M.D.   On: 06/14/2017 16:26    Radiological Exams on Admission: Dg Chest 1 View  Result Date: 06/14/2017 CLINICAL DATA:  Preop, history of colon carcinoma with liver metastases EXAM: CHEST 1 VIEW COMPARISON:  PET scan of 04/24/2017 FINDINGS: No active infiltrate or effusion is seen. Right-sided Port-A-Cath tip overlies the mid SVC. Mediastinal and hilar contours are unremarkable. The heart is mildly enlarged and the descending thoracic aorta is ectatic. No acute  bony abnormality is seen. IMPRESSION: 1. No active lung disease.  Mild cardiomegaly. 2. Port-A-Cath tip overlies the mid SVC. Electronically Signed   By: Ivar Drape M.D.   On: 06/14/2017 10:31   Ct Guide Tissue Ablation  Result Date: 06/14/2017 CLINICAL DATA:  Metastasis of right lobe of liver from colon carcinoma. The patient presents for thermal ablation. EXAM: CT-GUIDED PERCUTANEOUS THERMAL ABLATION OF LIVER METASTASIS COMPARISON:  PET scan on 04/24/2017 ANESTHESIA/SEDATION: Anesthesia:  General Medications: 3.375 g IV Zosyn. The antibiotic was administered in an appropriate time interval prior to needle puncture of  the skin. CONTRAST:  None. PROCEDURE: The procedure, risks, benefits, and alternatives were explained to the patient. Questions regarding the procedure were encouraged and answered. The patient understands and consents to the procedure. The patient was placed under general anesthesia. Initial unenhanced CT was performed in a supine position to localize the liver. Ultrasound was also performed to localize the liver lesion in the lateral aspect of segment 8. The right lateral abdominal wall was prepped with chlorhexidine in a sterile fashion, and a sterile drape was applied covering the operative field. A sterile gown and sterile gloves were used for the procedure. Under both ultrasound and CT guidance, 2 separate NeuWave PR thermal ablation probes were advanced into the liver. Probe positioning was confirmed by CT prior to ablation. Thermal ablation was performed through the 2 probes simultaneously. Initial 5 minute cycle of ablation was performed at 65 watts. The probes were then retracted 2 cm and an additional 4 minutes cycle of ablation performed. COMPLICATIONS: None FINDINGS: Subcapsular lesion within the lateral aspect of segment 8 of the right lobe was well visualized by ultrasound and demonstrates oval shaped and is relatively hyperechoic. This measured approximately 3.3 cm. Given  subcapsular location, a 2 probe wedge shaped ablation technique was performed along anterior and posterior margins of the tumor. IMPRESSION: CT guided percutaneous thermal ablation of right lobe liver metastasis. The patient will be observed overnight. Initial follow-up will be performed in approximately 4 weeks. Electronically Signed   By: Aletta Edouard M.D.   On: 06/14/2017 16:26    DVT Prophylaxis -SCD   AM Labs Ordered, also please review Full Orders  Family Communication: Admission, patients condition and plan of care including tests being ordered have been discussed with the patient and sister who indicate understanding and agree with the plan   Code Status - Full Code  Likely DC to  Home with [facility]  Condition   stable  Roxan Hockey M.D on 06/15/2017 at 1:57 PM   Between 7am to 7pm - Pager - [phone] After 7pm go to [email or web address] - password [email or web address]  [facility] - Office  [phone]  Voice Recognition Viviann Spare dictation system was used to create this note, attempts have been made to correct errors. Please contact the author with questions and/or clarifications.

## 2017-06-15 NOTE — Progress Notes (Signed)
Pt to dc home with home health PT. Choice offered to pt and sister and [facility] chosen. [facility] rep alerted of referral. Pt going to sister's address ([address] [place] [place] [address]). Marney Doctor RN,BSN,NCM [phone]

## 2017-06-15 NOTE — Discharge Summary (Signed)
Patient ID: Melanie [name] MRN: [id number] DOB/AGE: 10/28/1934 83 y.o.  Admit date: 06/14/2017 Discharge date: 06/15/2017  Supervising Physician: Aletta Edouard  Patient Status: [facility] - In-pt  Admission Diagnoses: Metastatic colon cancer to liver  Discharge Diagnoses: Metastatic colon cancer to liver, status post CT and ultrasound-guided microwave thermal ablation of right hepatic lobe metastasis via general anesthesia on 06/14/17 Principal Problem:   Colon carcinoma metastatic to liver [facility]) Active Problems:   Foot drop, left foot  Past Medical History:  Diagnosis Date  . Arthritis   . Chronic thyroiditis   . Colon cancer ([place])    colon  . Colostomy in place [facility])    history of, no longer in place  . Essential hypertension   . Goals of care, counseling/discussion 09/30/2016  . Goiter   . Hypothyroidism    Past Surgical History:  Procedure Laterality Date  . COLON SURGERY  07/2016   colostomy  . COLONOSCOPY    . COLOSTOMY REVERSAL    . IR RADIOLOGIST EVAL & MGMT  04/27/2017  . NEPHRECTOMY  07/2016  . PORTA CATH INSERTION    . RADIOFREQUENCY ABLATION N/A 06/14/2017   Procedure: CT MICROWAVE THERMAL ABLATION;  Surgeon: Aletta Edouard, MD;  Location: [facility] ORS;  Service: Anesthesiology;  Laterality: N/A;  . SPLENECTOMY  07/2016      Discharged Condition: good  Hospital Course: Melanie [name] is an 83 yo female with hx stage IIIB adenocarcinoma of the descending colon diagnosed  last year with surgery performed in [place].  She has also undergone chemotherapy.  Recent PET scan revealed mild increase in size and hypermetabolic activity of a solitary liver metastasis in segment 8.  Following consultation with Dr. Kathlene Cote on 04/27/17 she was deemed an appropriate candidate for CT-guided thermal ablation of this liver lesion.  On 06/14/17 she underwent CT and ultrasound-guided microwave thermal ablation segment 8 liver metastasis via general anesthesia.  There were no immediate  complications.  She was admitted for overnight observation.  Overnight the patient did well with exception of some mild right upper quadrant discomfort.  On the morning of discharge during ambulation patient was noted to have left foot drop which was a new finding.  Patient has no prior history of this problem.  She exhibited no other neurological complaints.  She continued to have some mild right upper quadrant discomfort but denied fever, headache, chest pain, dyspnea, cough, nausea, vomiting or bleeding. She was able to tolerate her diet and void without difficulty. Follow-up labs on day of discharge revealed WBC 10.2, hemoglobin 10.7, platelets 261k, potassium 4.2, creatinine 1.42.  [facility] (Dr. Joesph Fillers) was consulted for evaluation of left foot drop and recommended outpatient lumbar spine MRI as well as physical therapy. He also discussed her case with neurology and recommended follow-up at [facility] with Dr. Jannifer Franklin.  Patient was also seen by physical therapy who recommended home PT (ordered) as well as rolling walker to assist with ambulation and prefabricated AFO which patient should receive prior to discharge.  She will be scheduled for follow-up with Dr. Kathlene Cote in the [place] clinic in 3-4 weeks.  She will continue her current home medications.  She was told to contact our service with any additional questions.   Consults: [facility] hospitalists and physical therapy  Significant Diagnostic Studies:  Results for orders placed or performed during the hospital encounter of 06/14/17  CBC  Result Value Ref Range   WBC 10.2 4.0 - 10.5 K/uL   RBC 3.67 (L) 3.87 -  5.11 MIL/uL   Hemoglobin 10.7 (L) 12.0 - 15.0 g/dL   HCT 32.3 (L) 36.0 - 46.0 %   MCV 88.0 78.0 - 100.0 fL   MCH 29.2 26.0 - 34.0 pg   MCHC 33.1 30.0 - 36.0 g/dL   RDW 16.3 (H) 11.5 - 15.5 %   Platelets 261 150 - 400 K/uL  Comprehensive metabolic panel  Result Value Ref Range   Sodium 138 135 - 145 mmol/L   Potassium 4.2 3.5  - 5.1 mmol/L   Chloride 108 101 - 111 mmol/L   CO2 26 22 - 32 mmol/L   Glucose, Bld 122 (H) 65 - 99 mg/dL   BUN 28 (H) 6 - 20 mg/dL   Creatinine, Ser 1.42 (H) 0.44 - 1.00 mg/dL   Calcium 9.1 8.9 - 10.3 mg/dL   Total Protein 7.0 6.5 - 8.1 g/dL   Albumin 3.4 (L) 3.5 - 5.0 g/dL   AST 188 (H) 15 - 41 U/L   ALT 34 14 - 54 U/L   Alkaline Phosphatase 141 (H) 38 - 126 U/L   Total Bilirubin 0.3 0.3 - 1.2 mg/dL   GFR calc non Af Amer 34 (L) >60 mL/min   GFR calc Af Amer 39 (L) >60 mL/min   Anion gap 4 (L) 5 - 15     Treatments: CT and ultrasound guided microwave thermal ablation of right hepatic lobe metastasis (colon cancer primary) via general anesthesia on 06/14/17  Discharge Exam: Blood pressure (!) 148/73, pulse 70, temperature 99.1 F (37.3 C), temperature source Oral, resp. rate 16, height 5\' 4"  (1.626 m), weight 178 lb (80.7 kg), SpO2 96 %. Patient awake, alert and oriented x3.  Speech normal, face symmetrical, tongue midline, no drift, pupils equal round reactive to light, extraocular movements intact, finger to nose and fine motor movements normal; strength in upper extremities 5/5, right lower extremity 5/5, left lower extremity with noted foot drop-unable to dorsiflex but can plantarflex; sensory function equal bilaterally; chest clear to auscultation bilaterally.  Clean, intact right chest wall Port-A-Cath.  Heart with regular rate and rhythm.  Abdomen soft, positive bowel sounds, puncture site right upper quadrant clean, dry, mildly tender, no distinct hematoma or ecchymosis noted ;no lower extremity edema.  Disposition: 06-Home-Health Care Svc  Discharge Instructions    Ambulatory referral to Neurology   Complete by:  As directed    An appointment is requested in approximately: 1 to 2  weeks  Patient is to see a neuromuscular neurologist at [facility] neurology (Dr Jannifer Franklin or any available NeuroMuscular Neurologist) for left foot drop evaluation and possible EMG and lumbar MRI    Call MD for:  difficulty breathing, headache or visual disturbances   Complete by:  As directed    Call MD for:  extreme fatigue   Complete by:  As directed    Call MD for:  hives   Complete by:  As directed    Call MD for:  persistant dizziness or light-headedness   Complete by:  As directed    Call MD for:  persistant nausea and vomiting   Complete by:  As directed    Call MD for:  redness, tenderness, or signs of infection (pain, swelling, redness, odor or green/yellow discharge around incision site)   Complete by:  As directed    Call MD for:  severe uncontrolled pain   Complete by:  As directed    Call MD for:  temperature >100.4   Complete by:  As directed  Change dressing (specify)   Complete by:  As directed    May change bandage over puncture site right upper abdomen daily and apply new bandage to site as needed.  May wash site with soap and water   Diet - low sodium heart healthy   Complete by:  As directed    Discharge instructions   Complete by:  As directed    Please contact Dr. Jannifer Franklin at [facility] [phone] to schedule appt to evaluate left foot drop; radiology will call you with follow up appt with Dr. Kathlene Cote in 3-4 weeks; call [phone] or [phone] with any questions; please use rolling walker to assist with ambulation as well until left foot problem resolves   Driving Restrictions   Complete by:  As directed    No driving until evaluated by evaluated by neurology   Increase activity slowly   Complete by:  As directed    Lifting restrictions   Complete by:  As directed    No heavy lifting until evaluated by neurology     Allergies as of 06/15/2017   No Known Allergies     Medication List    STOP taking these medications   triamterene-hydrochlorothiazide 37.5-25 MG tablet Commonly known as:  MAXZIDE-25     TAKE these medications   CALTRATE 600+D PO Take 1 tablet by mouth at bedtime.   ferrous sulfate 325 (65 FE) MG  tablet Take 325 mg by mouth daily with breakfast.   levothyroxine 25 MCG tablet Commonly known as:  SYNTHROID, LEVOTHROID Take 25 mcg by mouth daily before breakfast.   lidocaine-prilocaine cream Commonly known as:  EMLA Apply 1 application topically as needed. What changed:    when to take this  reasons to take this   metoprolol tartrate 25 MG tablet Commonly known as:  LOPRESSOR Take 50 mg by mouth 2 (two) times daily.   multivitamin with minerals Tabs tablet Take 1 tablet by mouth daily.   potassium chloride SA 20 MEQ tablet Commonly known as:  K-DUR,KLOR-CON Take 1 tablet (20 mEq total) by mouth daily.   prochlorperazine 10 MG tablet Commonly known as:  COMPAZINE Take 1 tablet (10 mg total) by mouth every 6 (six) hours as needed for nausea or vomiting.   VITAMIN B-12 PO Take 1 tablet by mouth daily.            Discharge Care Instructions  (From admission, onward)        Start     Ordered   06/15/17 0000  Change dressing (specify)    Comments:  May change bandage over puncture site right upper abdomen daily and apply new bandage to site as needed.  May wash site with soap and water   06/15/17 1459     Follow-up Information    Aletta Edouard, MD Follow up.   Specialties:  Interventional Radiology, Radiology Why:  Radiology service will call you with follow-up appointment with Dr. Kathlene Cote in 3-4 weeks in the IR clinic; call [phone] or [phone] with any questions Contact information: [address] [place] [address] [phone]        [name], MD Follow up.   Specialty:  Oncology Why:  follow up with Dr. Marin Olp as scheduled Contact information: [address] [place] [address] [phone]        Kathrynn Ducking, MD Follow up.   Specialty:  Neurology Why:  Please contact Dr. Jannifer Franklin to schedule appointment to evaluate  left foot drop. Contact information: [address] [place] [address] [phone]            Electronically Signed: D. Rowe Robert, PA-C 06/15/2017, 3:01 PM   I have spent more than 30 minutes discharging Melanie [name].

## 2017-06-15 NOTE — Discharge Instructions (Signed)
Radiofrequency Ablation of Liver Tumors, Care After Refer to this sheet in the next few weeks. These instructions provide you with information on caring for yourself after your procedure. Your health care provider may also give you more specific instructions. Your treatment has been planned according to current medical practices, but problems sometimes occur. Call your health care provider if you have any problems or questions after your procedure. What can I expect after the procedure? After your procedure, you may have pain and discomfort in the upper abdomen. You will be given pain medicines to control this. Follow these instructions at home:  Take all medicines as directed by your health care provider.  Follow diet instructions as directed by your health care provider.  Follow instructions regarding rest and physical activity. Contact a health care provider if:  You cannot pass gas.  You cannot have a bowel movement within 2 days.  You have a skin rash.  You have a fever. Get help right away if:  You have severe or lasting abdominal pain or pain in your shoulder or back.  You have trouble swallowing or breathing.  You have severe weakness or dizziness.  You have chest pain or shortness of breath. This information is not intended to replace advice given to you by your health care provider. Make sure you discuss any questions you have with your health care provider. Document Released: 03/06/2013 Document Revised: 10/22/2015 Document Reviewed: 01/21/2013 Elsevier Interactive Patient Education  [employer].

## 2017-06-15 NOTE — Progress Notes (Addendum)
IR asked RN to observe patient walking.  Patient complain of stiffness in left foot while ambulating.  This RN observed patient picking foot straight up and back down without bending foot while ambulating.

## 2017-07-06 ENCOUNTER — Other Ambulatory Visit: Payer: Self-pay

## 2017-07-06 ENCOUNTER — Inpatient Hospital Stay: Payer: Medicare Other | Attending: Hematology & Oncology

## 2017-07-06 VITALS — BP 185/83 | HR 55 | Temp 98.5°F

## 2017-07-06 DIAGNOSIS — C189 Malignant neoplasm of colon, unspecified: Secondary | ICD-10-CM | POA: Diagnosis not present

## 2017-07-06 DIAGNOSIS — C787 Secondary malignant neoplasm of liver and intrahepatic bile duct: Secondary | ICD-10-CM | POA: Insufficient documentation

## 2017-07-06 DIAGNOSIS — Z452 Encounter for adjustment and management of vascular access device: Secondary | ICD-10-CM | POA: Insufficient documentation

## 2017-07-06 MED ORDER — SODIUM CHLORIDE 0.9% FLUSH
10.0000 mL | INTRAVENOUS | Status: AC | PRN
  Administered 2017-07-06: 10 mL
  Filled 2017-07-06: qty 10

## 2017-07-06 MED ORDER — HEPARIN SOD (PORK) LOCK FLUSH 100 UNIT/ML IV SOLN
500.0000 [IU] | INTRAVENOUS | Status: AC | PRN
  Administered 2017-07-06: 500 [IU]
  Filled 2017-07-06: qty 5

## 2017-07-06 NOTE — Patient Instructions (Signed)
Implanted Port Home Guide An implanted port is a type of central line that is placed under the skin. Central lines are used to provide IV access when treatment or nutrition needs to be given through a person's veins. Implanted ports are used for long-term IV access. An implanted port may be placed because:  You need IV medicine that would be irritating to the small veins in your hands or arms.  You need long-term IV medicines, such as antibiotics.  You need IV nutrition for a long period.  You need frequent blood draws for lab tests.  You need dialysis.  Implanted ports are usually placed in the chest area, but they can also be placed in the upper arm, the abdomen, or the leg. An implanted port has two main parts:  Reservoir. The reservoir is round and will appear as a small, raised area under your skin. The reservoir is the part where a needle is inserted to give medicines or draw blood.  Catheter. The catheter is a thin, flexible tube that extends from the reservoir. The catheter is placed into a large vein. Medicine that is inserted into the reservoir goes into the catheter and then into the vein.  How will I care for my incision site? Do not get the incision site wet. Bathe or shower as directed by your health care provider. How is my port accessed? Special steps must be taken to access the port:  Before the port is accessed, a numbing cream can be placed on the skin. This helps numb the skin over the port site.  Your health care provider uses a sterile technique to access the port. ? Your health care provider must put on a mask and sterile gloves. ? The skin over your port is cleaned carefully with an antiseptic and allowed to dry. ? The port is gently pinched between sterile gloves, and a needle is inserted into the port.  Only "non-coring" port needles should be used to access the port. Once the port is accessed, a blood return should be checked. This helps ensure that the port  is in the vein and is not clogged.  If your port needs to remain accessed for a constant infusion, a clear (transparent) bandage will be placed over the needle site. The bandage and needle will need to be changed every week, or as directed by your health care provider.  Keep the bandage covering the needle clean and dry. Do not get it wet. Follow your health care provider's instructions on how to take a shower or bath while the port is accessed.  If your port does not need to stay accessed, no bandage is needed over the port.  What is flushing? Flushing helps keep the port from getting clogged. Follow your health care provider's instructions on how and when to flush the port. Ports are usually flushed with saline solution or a medicine called heparin. The need for flushing will depend on how the port is used.  If the port is used for intermittent medicines or blood draws, the port will need to be flushed: ? After medicines have been given. ? After blood has been drawn. ? As part of routine maintenance.  If a constant infusion is running, the port may not need to be flushed.  How long will my port stay implanted? The port can stay in for as long as your health care provider thinks it is needed. When it is time for the port to come out, surgery will be   done to remove it. The procedure is similar to the one performed when the port was put in. When should I seek immediate medical care? When you have an implanted port, you should seek immediate medical care if:  You notice a bad smell coming from the incision site.  You have swelling, redness, or drainage at the incision site.  You have more swelling or pain at the port site or the surrounding area.  You have a fever that is not controlled with medicine.  This information is not intended to replace advice given to you by your health care provider. Make sure you discuss any questions you have with your health care provider. Document  Released: 05/16/2005 Document Revised: 10/22/2015 Document Reviewed: 01/21/2013 Elsevier Interactive Patient Education  2017 [employer]  

## 2017-07-12 ENCOUNTER — Emergency Department (HOSPITAL_COMMUNITY)
Admission: EM | Admit: 2017-07-12 | Discharge: 2017-07-12 | Disposition: A | Discharge status: Hospice | Payer: Medicare Other | Attending: Emergency Medicine | Admitting: Emergency Medicine

## 2017-07-12 ENCOUNTER — Encounter (HOSPITAL_COMMUNITY): Payer: Self-pay | Admitting: Emergency Medicine

## 2017-07-12 ENCOUNTER — Other Ambulatory Visit: Payer: Self-pay

## 2017-07-12 DIAGNOSIS — M21372 Foot drop, left foot: Secondary | ICD-10-CM | POA: Insufficient documentation

## 2017-07-12 DIAGNOSIS — Z79899 Other long term (current) drug therapy: Secondary | ICD-10-CM | POA: Diagnosis not present

## 2017-07-12 DIAGNOSIS — Z87891 Personal history of nicotine dependence: Secondary | ICD-10-CM | POA: Insufficient documentation

## 2017-07-12 DIAGNOSIS — C787 Secondary malignant neoplasm of liver and intrahepatic bile duct: Secondary | ICD-10-CM | POA: Diagnosis not present

## 2017-07-12 DIAGNOSIS — R030 Elevated blood-pressure reading, without diagnosis of hypertension: Secondary | ICD-10-CM | POA: Diagnosis present

## 2017-07-12 DIAGNOSIS — I10 Essential (primary) hypertension: Secondary | ICD-10-CM

## 2017-07-12 DIAGNOSIS — Z85038 Personal history of other malignant neoplasm of large intestine: Secondary | ICD-10-CM | POA: Insufficient documentation

## 2017-07-12 NOTE — ED Triage Notes (Signed)
Patient presents ambulatory with sister stating physical therapy was at the house today working with pt due to foot drop and checked BP and it was reading 200/100. Patient reports taking BP medication per usual. Denies any dizziness, headache or other symptoms.

## 2017-07-12 NOTE — ED Notes (Signed)
Pt is alert and orinted x 4 and is verbally responsive. Pt is escorted with her sister pt denies pain at this time.

## 2017-07-12 NOTE — ED Provider Notes (Signed)
[facility] DEPT Provider Note   CSN: [id number] Arrival date & time: 07/12/17  1232     History   Chief Complaint Chief Complaint  Patient presents with  . Hypertension    HPI Melanie [name] is a 83 y.o. female.  HPI  Patient is an 83 year old female with a history of hypertension, adenocarcinoma of the colon, stage III status post liver metastasis ablation on 06-14-2017 presenting for hypertensive readings at home.  After patient's liver lesion ablation in January 2019, patient developed a left foot drop with no other neurologic deficits.  Patient has subsequently been receiving physical therapy and home for this with out problems.  Blood pressure taken per physical therapist 2 days ago and today revealed systolic readings in the 983J, and patient was advised to come to the emergency department by both physical therapy and her primary care provider in [place].  Patient denies any blurred or double vision, headaches, new weakness or numbness, speech difficulty, chest pain, shortness of breath, or other complaints at this time.  Patient takes metoprolol 50 mg twice daily.  Patient has previously been on metoprolol and amlodipine, however her primary care provider stopped amlodipine.  Patient has an upcoming appointment with her PCP on 07-25-2017.  Past Medical History:  Diagnosis Date  . Arthritis   . Chronic thyroiditis   . Colon cancer ([place])    colon  . Colostomy in place [facility])    history of, no longer in place  . Essential hypertension   . Goals of care, counseling/discussion 09/30/2016  . Goiter   . Hypothyroidism     Patient Active Problem List   Diagnosis Date Noted  . Foot drop, left foot 06/15/2017  . Colon carcinoma metastatic to liver ([place]) 06/14/2017  . Acute renal failure superimposed on stage 3 chronic kidney disease ([place])   . Dehydration   . Liver lesion   . Protein-calorie malnutrition, severe 12/08/2016  .  Failure to thrive in adult 12/07/2016  . Chronic renal insufficiency, stage 3 (moderate) ([place]) 12/07/2016  . SIRS (systemic inflammatory response syndrome) ([place]) 12/06/2016  . Iron deficiency anemia due to chronic blood loss 11/09/2016  . Goals of care, counseling/discussion 09/30/2016  . Cancer of left colon ([place]) 09/26/2016    Past Surgical History:  Procedure Laterality Date  . COLON SURGERY  07/2016   colostomy  . COLONOSCOPY    . COLOSTOMY REVERSAL    . IR RADIOLOGIST EVAL & MGMT  04/27/2017  . NEPHRECTOMY  07/2016  . PORTA CATH INSERTION    . RADIOFREQUENCY ABLATION N/A 06/14/2017   Procedure: CT MICROWAVE THERMAL ABLATION;  Surgeon: Aletta Edouard, MD;  Location: [facility] ORS;  Service: Anesthesiology;  Laterality: N/A;  . SPLENECTOMY  07/2016    OB History    No data available       Home Medications    Prior to Admission medications   Medication Sig Start Date End Date Taking? Authorizing Provider  Calcium Carbonate-Vitamin D (CALTRATE 600+D PO) Take 1 tablet by mouth at bedtime.   Yes [provider]  Cyanocobalamin (VITAMIN B-12 PO) Take 1 tablet by mouth daily.   Yes [provider]  ferrous sulfate 325 (65 FE) MG tablet Take 325 mg by mouth daily with breakfast.   Yes [provider]  levothyroxine (SYNTHROID, LEVOTHROID) 25 MCG tablet Take 25 mcg by mouth daily before breakfast.    Yes [provider]  lidocaine-prilocaine (EMLA) cream Apply 1 application topically as  needed. Patient taking differently: Apply 1 application topically once as needed (prior to port access).  10/10/16  Yes [name], MD  metoprolol tartrate (LOPRESSOR) 25 MG tablet Take 50 mg by mouth 2 (two) times daily.    Yes [provider]  Multiple Vitamin (MULTIVITAMIN WITH MINERALS) TABS tablet Take 1 tablet by mouth daily.   Yes [provider]  potassium chloride SA (K-DUR,KLOR-CON) 20 MEQ tablet Take 1 tablet (20 mEq total) by mouth  daily. 02/01/17  Yes [name], MD    Family History Family History  Problem Relation Age of Onset  . Colon cancer Mother 74       Died last year at 80 years old  . Colon cancer Brother 45    Social History Social History   Tobacco Use  . Smoking status: Former Smoker    Packs/day: 1.00    Years: 35.00    Pack years: 35.00    Last attempt to quit: 1993    Years since quitting: 26.1  . Smokeless tobacco: Never Used  Substance Use Topics  . Alcohol use: No  . Drug use: No     Allergies   Patient has no known allergies.   Review of Systems Review of Systems  Constitutional: Negative for chills and fever.  HENT: Negative for congestion and rhinorrhea.   Eyes: Negative for visual disturbance.  Respiratory: Negative for chest tightness and shortness of breath.   Cardiovascular: Negative for chest pain.  Gastrointestinal: Negative for abdominal pain, nausea and vomiting.  Musculoskeletal: Positive for gait problem.       Foot drop chronic  Neurological: Negative for weakness, numbness and headaches.  All other systems reviewed and are negative.    Physical Exam Updated Vital Signs BP (!) 150/79 (BP Location: Left Arm)   Pulse (!) 57   Temp 98.5 F (36.9 C) (Oral)   Resp 16   Ht 5\' 4"  (1.626 m)   Wt 76.2 kg (168 lb)   SpO2 96%   BMI 28.84 kg/m   Physical Exam  Constitutional: She appears well-developed and well-nourished. No distress.  HENT:  Head: Normocephalic and atraumatic.  Mouth/Throat: Oropharynx is clear and moist.  Eyes: Conjunctivae and EOM are normal. Pupils are equal, round, and reactive to light.  Neck: Normal range of motion. Neck supple.  Cardiovascular: Normal rate, regular rhythm, S1 normal and S2 normal.  No murmur heard. Pulmonary/Chest: Effort normal and breath sounds normal. She has no wheezes. She has no rales.  Abdominal: She exhibits no distension.  Musculoskeletal: Normal range of motion. She exhibits no edema or deformity.   Lymphadenopathy:    She has no cervical adenopathy.  Neurological: She is alert.  Mental Status:  Alert, oriented, thought content appropriate, able to give a coherent history. Speech fluent without evidence of aphasia. Able to follow 2 step commands without difficulty.  Cranial Nerves:  II:  Peripheral visual fields grossly normal, pupils equal, round, reactive to light III,IV, VI: ptosis not present, extra-ocular motions intact bilaterally  V,VII: smile symmetric VIII: hearing grossly normal to voice  X: uvula elevates symmetrically  XI: bilateral shoulder shrug symmetric and strong XII: midline tongue extension without fassiculations Motor:  Normal tone. 5/5 in upper and lower extremities bilaterally including strong and equal grip strength. 4/5 plantar flexion on left, and assistive device in place. Sensory: Light touch normal in all extremities.  Cerebellar: normal finger-to-nose with bilateral upper extremities Gait: Slightly antalgic gait due to assistive device in place  in left ankle. Stance: No pronator drift and good coordination, strength, and position sense with tapping of bilateral arms (performed in sitting position).   Skin: Skin is warm and dry. No rash noted. No erythema.  Psychiatric: She has a normal mood and affect. Her behavior is normal. Judgment and thought content normal.  Nursing note and vitals reviewed.    ED Treatments / Results  Labs (all labs ordered are listed, but only abnormal results are displayed) Labs Reviewed - No data to display  EKG  EKG Interpretation None       Radiology No results found.  Procedures Procedures (including critical care time)  Medications Ordered in ED Medications - No data to display   Initial Impression / Assessment and Plan / ED Course  I have reviewed the triage vital signs and the nursing notes.  Pertinent labs & imaging results that were available during my care of the patient were reviewed by me and  considered in my medical decision making (see chart for details).     Final Clinical Impressions(s) / ED Diagnoses   Final diagnoses:  Elevated blood pressure reading with diagnosis of hypertension   Patient is nontoxic-appearing in no acute distress.  Patient had hypertensive readings in the emergency department including:  (!) 218/93 (!) 205/103 (!) 150/79  Blood pressure resolved to 150/79 during emergency department course.  This was without intervention.  Patient had a log of daily blood pressures that ranged from systolic of 157W to systolic of 620B with occasional readings with systolic of 559R.  Patient remains asymptomatic of any signs of endorgan damage including headaches, blurred or double vision, neurologic changes distinctive from her established left foot drop, chest pain, shortness of breath.  Patient has an upcoming appointment with her primary care provider in 2 weeks and is encouraged to discuss blood pressure management this time.  Patient can return precautions for any new neurologic changes, blurred vision, double vision, chest pain, shortness of breath.  Patient is in understanding and agrees with the plan of care.  This is a shared visit with Dr. Nat Christen. Patient was independently evaluated by this attending physician. Attending physician consulted in evaluation and discharge management.   ED Discharge Orders    None       Tamala Julian 07/12/17 1403    Nat Christen, MD 07/13/17 [phone]

## 2017-07-12 NOTE — Discharge Instructions (Signed)
Please continue to keep a log of your blood pressures daily to show your primary care provider in 2 weeks.  Please continue to take your blood pressure medication as prescribed.  Please return the emergency department for any episodes of blurred or double vision, dizziness, lightheadedness, shortness of breath, chest pain, new weakness or numbness.

## 2017-07-13 ENCOUNTER — Encounter: Payer: Self-pay | Admitting: *Deleted

## 2017-07-13 ENCOUNTER — Ambulatory Visit
Admission: RE | Admit: 2017-07-13 | Discharge: 2017-07-13 | Disposition: A | Discharge status: Home / Self Care | Payer: Medicare Other | Source: Ambulatory Visit | Attending: Radiology | Admitting: Radiology

## 2017-07-13 DIAGNOSIS — C189 Malignant neoplasm of colon, unspecified: Secondary | ICD-10-CM

## 2017-07-13 DIAGNOSIS — C787 Secondary malignant neoplasm of liver and intrahepatic bile duct: Principal | ICD-10-CM

## 2017-07-13 HISTORY — PX: IR RADIOLOGIST EVAL & MGMT: IMG5224

## 2017-07-13 NOTE — Progress Notes (Signed)
Chief Complaint: Metastatic colon cancer to liver  Referring Physician(s): Ennever,P  Supervising Physician: Aletta Edouard  History of Present Illness: Melanie [name] is a 83 y.o. female who was seen in consultation with Dr. Kathlene Cote on 04/27/17 to discuss treatment options for metastatic colon cancer to liver.    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    During that procedure she had to undergo splenectomy and nephrectomy due to tumor involvement.  She has also undergone chemotherapy.    Recent PET scan revealed a mild increase in size and hypermetabolic activity of a solitary liver metastasis in segment 8.    Following discussion with Dr. Kathlene Cote she was deemed an appropriate candidate for CT-guided thermal ablation of this liver lesion.  She underwent that procedure on  06/14/2017.  On the morning of discharge during ambulation patient was noted to have left foot drop which was a new finding.   TR[facility] (Dr. Joesph Fillers) was consulted for evaluation of left foot drop and recommended outpatient lumbar spine MRI as well as physical therapy.   He also discussed her case with neurology and recommended follow-up at [facility] with Dr. Jannifer Franklin.    She was also seen by physical therapy who recommended home PT (ordered) as well as rolling walker to assist with ambulation and foot brace/orthotic.  Today, she states she is walking much better. She is not having to rely on a cane or walker, but she is wearing the foot orthotic.  She is doing well after the ablation procedure. She has no pain in her RUQ.  She was seen in the ED yesterday for hypertension. Her SBP was over 200 initially and upon D/C from the ED it was 150/79.  She has an appointment in place to see her PCP for management.  Today she denies any issues, no fever/chills, no headache, no N/V, no SOB.    Past Medical History:   Diagnosis Date  . Arthritis   . Chronic thyroiditis   . Colon cancer ([place])    colon  . Colostomy in place [facility])    history of, no longer in place  . Essential hypertension   . Goals of care, counseling/discussion 09/30/2016  . Goiter   . Hypothyroidism     Past Surgical History:  Procedure Laterality Date  . COLON SURGERY  07/2016   colostomy  . COLONOSCOPY    . COLOSTOMY REVERSAL    . IR RADIOLOGIST EVAL & MGMT  04/27/2017  . IR RADIOLOGIST EVAL & MGMT  07/13/2017  . NEPHRECTOMY  07/2016  . PORTA CATH INSERTION    . RADIOFREQUENCY ABLATION N/A 06/14/2017   Procedure: CT MICROWAVE THERMAL ABLATION;  Surgeon: Aletta Edouard, MD;  Location: [facility];  Service: Anesthesiology;  Laterality: N/A;  . SPLENECTOMY  07/2016    Allergies: Patient has no known allergies.  Medications: Prior to Admission medications   Medication Sig Start Date End Date Taking? Authorizing Provider  Calcium Carbonate-Vitamin D (CALTRATE 600+D PO) Take 1 tablet by mouth at bedtime.    [provider]  Cyanocobalamin (VITAMIN B-12 PO) Take 1 tablet by mouth daily.    [provider]  ferrous sulfate 325 (65 FE) MG tablet Take 325 mg by mouth daily with breakfast.    [provider]  levothyroxine (SYNTHROID, LEVOTHROID) 25 MCG tablet Take 25 mcg by mouth daily before breakfast.     [provider]  lidocaine-prilocaine (EMLA)  cream Apply 1 application topically as needed. Patient taking differently: Apply 1 application topically once as needed (prior to port access).  10/10/16   [name], MD  metoprolol tartrate (LOPRESSOR) 25 MG tablet Take 50 mg by mouth 2 (two) times daily.     [provider]  Multiple Vitamin (MULTIVITAMIN WITH MINERALS) TABS tablet Take 1 tablet by mouth daily.    [provider]  potassium chloride SA (K-DUR,KLOR-CON) 20 MEQ tablet Take 1 tablet (20 mEq total) by mouth daily. 02/01/17   [name], MD     Family  History  Problem Relation Age of Onset  . Colon cancer Mother 65       Died last year at 25 years old  . Colon cancer Brother 12    Social History   Socioeconomic History  . Marital status: Widowed    Spouse name: Not on file  . Number of children: Not on file  . Years of education: Not on file  . Highest education level: Not on file  Social Needs  . Financial resource strain: Not on file  . Food insecurity - worry: Not on file  . Food insecurity - inability: Not on file  . Transportation needs - medical: Not on file  . Transportation needs - non-medical: Not on file  Occupational History  . Occupation: retired Therapist, sports carrier  Tobacco Use  . Smoking status: Former Smoker    Packs/day: 1.00    Years: 35.00    Pack years: 35.00    Last attempt to quit: 1993    Years since quitting: 26.1  . Smokeless tobacco: Never Used  Substance and Sexual Activity  . Alcohol use: No  . Drug use: No  . Sexual activity: Not on file  Other Topics Concern  . Not on file  Social History Narrative  . Not on file   Review of Systems: A 12 point ROS discussed and pertinent positives are indicated in the HPI above.  All other systems are negative. Review of Systems  Vital Signs: BP (!) 217/103   Pulse 65   Temp 98.1 F (36.7 C) (Oral)   Resp 15   Ht 5\' 4"  (1.626 m)   Wt 168 lb (76.2 kg)   SpO2 97%   BMI 28.84 kg/m   Physical Exam  Constitutional: She is oriented to person, place, and time. She appears well-developed.  HENT:  Head: Normocephalic and atraumatic.  Eyes: EOM are normal.  Neck: Normal range of motion.  Cardiovascular: Normal rate, regular rhythm, normal heart sounds and intact distal pulses.  Pulmonary/Chest: Effort normal and breath sounds normal.  Abdominal: Soft.  Punctures sites healed completely  Musculoskeletal: Normal range of motion.  She is still unable to dorsiflex the left foot, but she does have good plantar flexion and 5/5 strength with plantar  flexion.  Neurological: She is alert and oriented to person, place, and time.  Skin: Skin is warm and dry.  Psychiatric: She has a normal mood and affect. Her behavior is normal. Judgment and thought content normal.  Vitals reviewed.    Imaging: Dg Chest 1 View  Result Date: 06/14/2017 CLINICAL DATA:  Preop, history of colon carcinoma with liver metastases EXAM: CHEST 1 VIEW COMPARISON:  PET scan of 04/24/2017 FINDINGS: No active infiltrate or effusion is seen. Right-sided Port-A-Cath tip overlies the mid SVC. Mediastinal and hilar contours are unremarkable. The heart is mildly enlarged and the descending thoracic aorta is ectatic. No acute bony abnormality is  seen. IMPRESSION: 1. No active lung disease.  Mild cardiomegaly. 2. Port-A-Cath tip overlies the mid SVC. Electronically Signed   By: Ivar Drape M.D.   On: 06/14/2017 10:31   Ct Guide Tissue Ablation  Result Date: 06/14/2017 CLINICAL DATA:  Metastasis of right lobe of liver from colon carcinoma. The patient presents for thermal ablation. EXAM: CT-GUIDED PERCUTANEOUS THERMAL ABLATION OF LIVER METASTASIS COMPARISON:  PET scan on 04/24/2017 ANESTHESIA/SEDATION: Anesthesia:  General Medications: 3.375 g IV Zosyn. The antibiotic was administered in an appropriate time interval prior to needle puncture of the skin. CONTRAST:  None. PROCEDURE: The procedure, risks, benefits, and alternatives were explained to the patient. Questions regarding the procedure were encouraged and answered. The patient understands and consents to the procedure. The patient was placed under general anesthesia. Initial unenhanced CT was performed in a supine position to localize the liver. Ultrasound was also performed to localize the liver lesion in the lateral aspect of segment 8. The right lateral abdominal wall was prepped with chlorhexidine in a sterile fashion, and a sterile drape was applied covering the operative field. A sterile gown and sterile gloves were used for  the procedure. Under both ultrasound and CT guidance, 2 separate NeuWave PR thermal ablation probes were advanced into the liver. Probe positioning was confirmed by CT prior to ablation. Thermal ablation was performed through the 2 probes simultaneously. Initial 5 minute cycle of ablation was performed at 65 watts. The probes were then retracted 2 cm and an additional 4 minutes cycle of ablation performed. COMPLICATIONS: None FINDINGS: Subcapsular lesion within the lateral aspect of segment 8 of the right lobe was well visualized by ultrasound and demonstrates oval shaped and is relatively hyperechoic. This measured approximately 3.3 cm. Given subcapsular location, a 2 probe wedge shaped ablation technique was performed along anterior and posterior margins of the tumor. IMPRESSION: CT guided percutaneous thermal ablation of right lobe liver metastasis. The patient will be observed overnight. Initial follow-up will be performed in approximately 4 weeks. Electronically Signed   By: Aletta Edouard M.D.   On: 06/14/2017 16:26   Ir Radiologist Eval & Mgmt  Result Date: 07/13/2017 Please refer to notes tab for details about interventional procedure. (Op Note)   Labs:  CBC: Recent Labs    02/01/17 1054 04/26/17 0830 06/12/17 1140 06/15/17 0403  WBC 9.2 6.4 8.1 10.2  HGB 8.4* 12.0 12.0 10.7*  HCT 26.0* 35.8 36.4 32.3*  PLT 446* 343 368 261    COAGS: Recent Labs    12/06/16 1541 06/12/17 1140  INR 1.18 1.09    BMP: Recent Labs    12/09/16 0447 12/10/16 0633  02/01/17 1054 04/26/17 0830 06/12/17 1140 06/15/17 0403  NA 138 137   < > 142 144 138 138  K 3.4* 3.6   < > 3.2* 4.1 3.9 4.2  CL 117* 115*   < > 107 108 108 108  CO2 15* 18*   < > 29 27 24 26   GLUCOSE 72 86   < > 97 97 90 122*  BUN 22* 17   < > 15 25* 29* 28*  CALCIUM 8.0* 8.6*   < > 8.8 9.9 9.9 9.1  CREATININE 1.39* 1.32*   < > 1.4* 1.5* 1.31* 1.42*  GFRNONAA 35* 37*  --   --   --  37* 34*  GFRAA 41* 43*  --   --   --   43* 39*   < > = values in this interval not displayed.  LIVER FUNCTION TESTS: Recent Labs    02/01/17 1054 04/26/17 0830 06/12/17 1140 06/15/17 0403  BILITOT 0.60 0.60 0.8 0.3  AST 31 35 41 188*  ALT 12 16 16  34  ALKPHOS 137* 124* 172* 141*  PROT 6.0* 8.0 7.8 7.0  ALBUMIN 2.5* 3.5 3.8 3.4*    TUMOR MARKERS: No results for input(s): AFPTM, CEA, CA199, CHROMGRNA in the last 8760 hours.  Assessment:  Stage IIIB adenocarcinoma of the descending colon diagnosed  last year with surgery performed in [place] which included splenectomy and left nephrectomy due to tumor involvement.    PET scan revealed mild increase in size and hypermetabolic activity of a solitary liver metastasis in segment 8.    She is S/P CT-guided thermal ablation of the liver lesion 06/14/2017 by Dr. Kathlene Cote.  Her foot drop is stable and her gait has improved with physical therapy. She does have an appointment with neuro in 1 month.  Dr. Kathlene Cote also saw patient today.  Will have her return in 3 months with PET scan (she did NOT enjoy the MRI and would like to avoid that imaging modality if at all possible.  Also need to monitor kidney function regarding contrast enhanced imaging.   Electronically Signed: Murrell Redden PA-C 07/13/2017, 11:20 AM   Please refer to Dr. Margaretmary Dys attestation of this note for management and plan.

## 2017-07-18 ENCOUNTER — Ambulatory Visit (INDEPENDENT_AMBULATORY_CARE_PROVIDER_SITE_OTHER): Payer: Medicare Other | Admitting: Diagnostic Neuroimaging

## 2017-07-18 ENCOUNTER — Encounter: Payer: Self-pay | Admitting: Diagnostic Neuroimaging

## 2017-07-18 VITALS — BP 193/96 | HR 69 | Ht 64.0 in | Wt 180.0 lb

## 2017-07-18 DIAGNOSIS — M21372 Foot drop, left foot: Secondary | ICD-10-CM | POA: Diagnosis not present

## 2017-07-18 DIAGNOSIS — R208 Other disturbances of skin sensation: Secondary | ICD-10-CM

## 2017-07-18 DIAGNOSIS — R200 Anesthesia of skin: Secondary | ICD-10-CM

## 2017-07-18 NOTE — Patient Instructions (Signed)
-   continue PT exercises  - monitor symptoms for now as left foot drop weakness is improving  - if symptoms worsen or fail to improve, call us for follow up appointment and additional testing

## 2017-07-18 NOTE — Progress Notes (Signed)
[facility]  PATIENT: Melanie [name] DOB: 06/04/1934  REFERRING CLINICIAN: Denton Brick, C HISTORY FROM: patient and sister REASON FOR VISIT: new consult    HISTORICAL  CHIEF COMPLAINT:  Chief Complaint  Patient presents with  . Foot drop, left foot    rm 7, New Pt, sister- [name], "foot drop after radiological procedure on liver"    HISTORY OF PRESENT ILLNESS:   83 year old female here for evaluation of left-sided foot drop.  Patient was admitted to the hospital for CT and ultrasound-guided microwave thermal ablation of liver metastasis on 06/14/17.  Patient was treated with general anesthesia.  Upon waking up from procedure patient felt stable, but the next morning she noted left foot drop weakness.  Patient was noted to have left foot drop and recommended to have outpatient follow-up in neurology clinic.  Since that time symptoms have slightly improved.  She is able to move her left foot better than immediately post procedure.  She still has some numbness and tingling in her left foot.  She denies any low back pain or radiating symptoms from her low back.  No problems with her right leg or foot.  No problems with her neck or upper extremities.   REVIEW OF SYSTEMS: Full 14 system review of systems performed and negative with exception of: As per HPI.  ALLERGIES: No Known Allergies  HOME MEDICATIONS: Outpatient Medications Prior to Visit  Medication Sig Dispense Refill  . Calcium Carbonate-Vitamin D (CALTRATE 600+D PO) Take 1 tablet by mouth at bedtime.    . Cyanocobalamin (VITAMIN B-12 PO) Take 1 tablet by mouth daily.    . ferrous sulfate 325 (65 FE) MG tablet Take 325 mg by mouth daily with breakfast.    . levothyroxine (SYNTHROID, LEVOTHROID) 25 MCG tablet Take 25 mcg by mouth daily before breakfast.     . lidocaine-prilocaine (EMLA) cream Apply 1 application topically as needed. (Patient taking differently: Apply 1 application topically once as needed  (prior to port access). ) 30 g 0  . metoprolol tartrate (LOPRESSOR) 25 MG tablet Take 50 mg by mouth 2 (two) times daily.     . Multiple Vitamin (MULTIVITAMIN WITH MINERALS) TABS tablet Take 1 tablet by mouth daily.    . potassium chloride SA (K-DUR,KLOR-CON) 20 MEQ tablet Take 1 tablet (20 mEq total) by mouth daily. 10 tablet 1   No facility-administered medications prior to visit.     PAST MEDICAL HISTORY: Past Medical History:  Diagnosis Date  . Arthritis   . Chronic thyroiditis   . Colon cancer ([place])    colon  . Colostomy in place [facility])    history of, no longer in place  . Essential hypertension   . Goals of care, counseling/discussion 09/30/2016  . Goiter   . Hypothyroidism     PAST SURGICAL HISTORY: Past Surgical History:  Procedure Laterality Date  . COLON SURGERY  07/2016   colostomy, colorectal cancer, s/p chemotherapy  . COLONOSCOPY    . COLOSTOMY REVERSAL    . IR RADIOLOGIST EVAL & MGMT  04/27/2017  . IR RADIOLOGIST EVAL & MGMT  07/13/2017  . NEPHRECTOMY  07/2016  . PORTA CATH INSERTION    . RADIOFREQUENCY ABLATION N/A 06/14/2017   Procedure: CT MICROWAVE THERMAL ABLATION;  Surgeon: Aletta Edouard, MD;  Location: [facility];  Service: Anesthesiology;  Laterality: N/A;  . SPLENECTOMY  07/2016    FAMILY HISTORY: Family History  Problem Relation Age of Onset  . Colon cancer Mother 60  Died last year at 55 years old  . Colon cancer Brother 83    SOCIAL HISTORY:  Social History   Socioeconomic History  . Marital status: Widowed    Spouse name: Not on file  . Number of children: 0  . Years of education: 41  . Highest education level: Not on file  Social Needs  . Financial resource strain: Not on file  . Food insecurity - worry: Not on file  . Food insecurity - inability: Not on file  . Transportation needs - medical: Not on file  . Transportation needs - non-medical: Not on file  Occupational History  . Occupation: retired Therapist, sports carrier  Tobacco  Use  . Smoking status: Former Smoker    Packs/day: 1.00    Years: 35.00    Pack years: 35.00    Last attempt to quit: 1993    Years since quitting: 26.1  . Smokeless tobacco: Never Used  Substance and Sexual Activity  . Alcohol use: No  . Drug use: No  . Sexual activity: Not on file  Other Topics Concern  . Not on file  Social History Narrative   Lives alone   Retired   Caffeine- 1 cup daily   No children   High school education     PHYSICAL EXAM  GENERAL EXAM/CONSTITUTIONAL: Vitals:  Vitals:   07/18/17 1039  BP: (!) 193/96  Pulse: 69  Weight: 180 lb (81.6 kg)  Height: 5\' 4"  (1.626 m)     Body mass index is 30.9 kg/m.  No exam data present  Patient is in no distress; well developed, nourished and groomed; neck is supple  CARDIOVASCULAR:  Examination of carotid arteries is normal; no carotid bruits  Regular rate and rhythm, no murmurs  Examination of peripheral vascular system by observation and palpation is normal  EYES:  Ophthalmoscopic exam of optic discs and posterior segments is normal; no papilledema or hemorrhages  MUSCULOSKELETAL:  Gait, strength, tone, movements noted in Neurologic exam below  NEUROLOGIC: MENTAL STATUS:  No flowsheet data found.  awake, alert, oriented to person, place and time  recent and remote memory intact  normal attention and concentration  language fluent, comprehension intact, naming intact,   fund of knowledge appropriate  CRANIAL NERVE:   2nd - no papilledema on fundoscopic exam  2nd, 3rd, 4th, 6th - pupils equal and reactive to light, visual fields full to confrontation, extraocular muscles intact, no nystagmus  5th - facial sensation symmetric  7th - facial strength symmetric  8th - hearing intact  9th - palate elevates symmetrically, uvula midline  11th - shoulder shrug symmetric  12th - tongue protrusion midline  MOTOR:   normal bulk and tone, full strength in the BUE, RLE  LLE -->  WEAKNESS OF LEFT FOOT DORSIFLEXION (2-3), LEFT EHL (2), LEFT FOOT EVERSION (3); LEFT FOOT INVERSION AND PLANTAR FLEXION IS 5; HAS LEFT AFO BRACE  LEFT HAND 3RD DIGIT AMPUTATION (FROM AGE 72 YEARS OLD)  SENSORY:   normal and symmetric to light touch, pinprick, temperature, vibration; EXCEPT DECR PP IN LEFT FOOT DORSUM AND LATERALLY  COORDINATION:   finger-nose-finger, fine finger movements normal  REFLEXES:   deep tendon reflexes 2+ and symmetric  GAIT/STATION:   narrow based gait; able to walk on toes; CANNOT WALK ON LEFT HEEL    DIAGNOSTIC DATA (LABS, IMAGING, TESTING) - I reviewed patient records, labs, notes, testing and imaging myself where available.  Lab Results  Component Value Date   WBC 10.2 06/15/2017  HGB 10.7 (L) 06/15/2017   HCT 32.3 (L) 06/15/2017   MCV 88.0 06/15/2017   PLT 261 06/15/2017      Component Value Date/Time   NA 138 06/15/2017 0403   NA 144 04/26/2017 0830   NA 137 09/30/2016 0940   K 4.2 06/15/2017 0403   K 4.1 04/26/2017 0830   K 4.7 09/30/2016 0940   CL 108 06/15/2017 0403   CL 108 04/26/2017 0830   CO2 26 06/15/2017 0403   CO2 27 04/26/2017 0830   CO2 18 (L) 09/30/2016 0940   GLUCOSE 122 (H) 06/15/2017 0403   GLUCOSE 97 04/26/2017 0830   BUN 28 (H) 06/15/2017 0403   BUN 25 (H) 04/26/2017 0830   BUN 21.5 09/30/2016 0940   CREATININE 1.42 (H) 06/15/2017 0403   CREATININE 1.5 (H) 04/26/2017 0830   CREATININE 1.8 (H) 09/30/2016 0940   CALCIUM 9.1 06/15/2017 0403   CALCIUM 9.9 04/26/2017 0830   CALCIUM 10.4 09/30/2016 0940   PROT 7.0 06/15/2017 0403   PROT 8.0 04/26/2017 0830   PROT 8.3 09/30/2016 0940   ALBUMIN 3.4 (L) 06/15/2017 0403   ALBUMIN 3.5 04/26/2017 0830   ALBUMIN 3.6 09/30/2016 0940   AST 188 (H) 06/15/2017 0403   AST 35 04/26/2017 0830   AST 27 09/30/2016 0940   ALT 34 06/15/2017 0403   ALT 16 04/26/2017 0830   ALT 11 09/30/2016 0940   ALKPHOS 141 (H) 06/15/2017 0403   ALKPHOS 124 (H) 04/26/2017 0830    ALKPHOS 147 09/30/2016 0940   BILITOT 0.3 06/15/2017 0403   BILITOT 0.60 04/26/2017 0830   BILITOT 0.34 09/30/2016 0940   GFRNONAA 34 (L) 06/15/2017 0403   GFRAA 39 (L) 06/15/2017 0403   No results found for: CHOL, HDL, LDLCALC, LDLDIRECT, TRIG, CHOLHDL No results found for: HGBA1C No results found for: VITAMINB12 Lab Results  Component Value Date   TSH 3.839 12/06/2016       ASSESSMENT AND PLAN  83 y.o. year old female here with left foot drop weakness, likely due to left peroneal neuropathy (periprocedural) immediately following general anesthesia and CT-guided liver metastasis ablation therapy on 06/14/17.  Symptoms are gradually improving.  Encourage patient to continue to use AFO, PT exercises and monitor symptoms.  EMG and MRI lumbar spine not indicated at this time.   Dx: left peroneal neuropathy (peri-procedural)  1. Left foot drop   2. Numbness of left foot      PLAN:  - continue PT exercises  - monitor symptoms for now as left foot drop weakness is improving  - if symptoms worsen or fail to improve, then may consider EMG/NCS  Return if symptoms worsen or fail to improve, for return to PCP.    Penni Bombard, MD 08/23/6145, 09:29 AM Certified in Neurology, Neurophysiology and Neuroimaging  [facility] [address] [place], [place] [address] [phone]

## 2017-07-26 ENCOUNTER — Other Ambulatory Visit: Payer: Medicare Other

## 2017-07-26 ENCOUNTER — Ambulatory Visit: Payer: Medicare Other | Admitting: Hematology & Oncology

## 2017-08-04 ENCOUNTER — Inpatient Hospital Stay (HOSPITAL_BASED_OUTPATIENT_CLINIC_OR_DEPARTMENT_OTHER): Payer: Medicare Other | Admitting: Family

## 2017-08-04 ENCOUNTER — Other Ambulatory Visit: Payer: Self-pay

## 2017-08-04 ENCOUNTER — Encounter: Payer: Self-pay | Admitting: Family

## 2017-08-04 ENCOUNTER — Inpatient Hospital Stay: Payer: Medicare Other | Attending: Hematology & Oncology

## 2017-08-04 ENCOUNTER — Inpatient Hospital Stay: Payer: Medicare Other

## 2017-08-04 VITALS — BP 160/75 | HR 63 | Temp 98.1°F | Resp 18 | Wt 178.0 lb

## 2017-08-04 DIAGNOSIS — C189 Malignant neoplasm of colon, unspecified: Secondary | ICD-10-CM

## 2017-08-04 DIAGNOSIS — Z79899 Other long term (current) drug therapy: Secondary | ICD-10-CM | POA: Diagnosis not present

## 2017-08-04 DIAGNOSIS — C787 Secondary malignant neoplasm of liver and intrahepatic bile duct: Secondary | ICD-10-CM | POA: Diagnosis not present

## 2017-08-04 DIAGNOSIS — M21372 Foot drop, left foot: Secondary | ICD-10-CM | POA: Diagnosis not present

## 2017-08-04 DIAGNOSIS — Z9889 Other specified postprocedural states: Secondary | ICD-10-CM | POA: Insufficient documentation

## 2017-08-04 DIAGNOSIS — Z95828 Presence of other vascular implants and grafts: Secondary | ICD-10-CM

## 2017-08-04 DIAGNOSIS — C186 Malignant neoplasm of descending colon: Secondary | ICD-10-CM | POA: Insufficient documentation

## 2017-08-04 DIAGNOSIS — Z452 Encounter for adjustment and management of vascular access device: Secondary | ICD-10-CM | POA: Diagnosis present

## 2017-08-04 LAB — CBC WITH DIFFERENTIAL (CANCER CENTER ONLY)
Basophils Absolute: 0.1 10*3/uL (ref 0.0–0.1)
Basophils Relative: 1 %
Eosinophils Absolute: 0.4 10*3/uL (ref 0.0–0.5)
Eosinophils Relative: 7 %
HCT: 35.7 % (ref 34.8–46.6)
Hemoglobin: 12.1 g/dL (ref 11.6–15.9)
Lymphocytes Relative: 16 %
Lymphs Abs: 1 10*3/uL (ref 0.9–3.3)
MCH: 29.9 pg (ref 26.0–34.0)
MCHC: 33.9 g/dL (ref 32.0–36.0)
MCV: 88.1 fL (ref 81.0–101.0)
Monocytes Absolute: 1 10*3/uL — ABNORMAL HIGH (ref 0.1–0.9)
Monocytes Relative: 16 %
Neutro Abs: 3.6 10*3/uL (ref 1.5–6.5)
Neutrophils Relative %: 60 %
Platelet Count: 308 10*3/uL (ref 145–400)
RBC: 4.05 MIL/uL (ref 3.70–5.32)
RDW: 15.9 % — ABNORMAL HIGH (ref 11.1–15.7)
WBC Count: 6 10*3/uL (ref 3.9–10.0)

## 2017-08-04 LAB — CMP (CANCER CENTER ONLY)
ALT: 28 U/L (ref 10–47)
AST: 61 U/L — ABNORMAL HIGH (ref 11–38)
Albumin: 3.5 g/dL (ref 3.5–5.0)
Alkaline Phosphatase: 367 U/L — ABNORMAL HIGH (ref 26–84)
Anion gap: 8 (ref 5–15)
BUN: 31 mg/dL — ABNORMAL HIGH (ref 7–22)
CO2: 27 mmol/L (ref 18–33)
Calcium: 10.7 mg/dL — ABNORMAL HIGH (ref 8.0–10.3)
Chloride: 104 mmol/L (ref 98–108)
Creatinine: 1.5 mg/dL — ABNORMAL HIGH (ref 0.60–1.20)
Glucose, Bld: 96 mg/dL (ref 73–118)
Potassium: 3.9 mmol/L (ref 3.3–4.7)
Sodium: 139 mmol/L (ref 128–145)
Total Bilirubin: 0.6 mg/dL (ref 0.2–1.6)
Total Protein: 8.3 g/dL — ABNORMAL HIGH (ref 6.4–8.1)

## 2017-08-04 LAB — CEA (IN HOUSE-CHCC): CEA (CHCC-In House): 6.22 ng/mL — ABNORMAL HIGH (ref 0.00–5.00)

## 2017-08-04 LAB — LACTATE DEHYDROGENASE: LDH: 204 U/L (ref 125–245)

## 2017-08-04 MED ORDER — HEPARIN SOD (PORK) LOCK FLUSH 100 UNIT/ML IV SOLN
500.0000 [IU] | Freq: Once | INTRAVENOUS | Status: AC
  Administered 2017-08-04: 500 [IU] via INTRAVENOUS
  Filled 2017-08-04: qty 5

## 2017-08-04 MED ORDER — SODIUM CHLORIDE 0.9% FLUSH
10.0000 mL | INTRAVENOUS | Status: DC | PRN
  Administered 2017-08-04: 10 mL via INTRAVENOUS
  Filled 2017-08-04: qty 10

## 2017-08-04 NOTE — Progress Notes (Signed)
Hematology and Oncology Follow Up Visit  GREGORIA SELVY [id number] Nov 10, 1934 83 y.o. 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 Cly is here today with her sister for follow-up. She is doing quite well since having RFA to the right lobe lesion of the liver in January with Dr. Kathlene Cote. She follows up again with his office in April.  Her CEA is slowly starting to come down at 6.22 today.  Her alk/phos is up to 367. I spoke with Dr. Marin Olp and we will just watch for now. She was instructed to contact us if she developed right flank pain or noticed any s/s of jaundice.  She has had no fever, chills, n/v, cough, rash, dizziness, SOB, chest pain, palpitations, abdominal pain or changes in bowel or bladder habits.  She did develop left foot drop after her surgery and is wearing a brace with her shoe for support.  No episodes of bleeding, no bruising or petechiae. No lymphadenopathy found on exam.  No swelling, tenderness, numbness or tingling in her extremities at this time. No c/o pain.  She has maintained a good appetite and is staying well hydrated. Her weight is stable.   ECOG Performance Status: 1 - Symptomatic but completely ambulatory  Medications:  Allergies as of 08/04/2017   No Known Allergies     Medication List        Accurate as of 08/04/17  8:59 AM. Always use your most recent med list.          CALTRATE 600+D PO Take 1 tablet by mouth at bedtime.   ferrous sulfate 325 (65 FE) MG tablet Take 325 mg by mouth daily with breakfast.   levothyroxine 25 MCG tablet Commonly known as:  SYNTHROID, LEVOTHROID Take 25 mcg by mouth daily before breakfast.   lidocaine-prilocaine cream Commonly  known as:  EMLA Apply 1 application topically as needed.   metoprolol tartrate 25 MG tablet Commonly known as:  LOPRESSOR Take 50 mg by mouth 2 (two) times daily.   multivitamin with minerals Tabs tablet Take 1 tablet by mouth daily.   potassium chloride SA 20 MEQ tablet Commonly known as:  K-DUR,KLOR-CON Take 1 tablet (20 mEq total) by mouth daily.   VITAMIN B-12 PO Take 1 tablet by mouth daily.       Allergies: No Known Allergies  Past Medical History, Surgical history, Social history, and Family History were reviewed and updated.  Review of Systems: All other 10 point review of systems is negative.   Physical Exam:  vitals were not taken for this visit.   Wt Readings from Last 3 Encounters:  07/18/17 180 lb (81.6 kg)  07/13/17 168 lb (76.2 kg)  07/12/17 168 lb (76.2 kg)    Ocular: Sclerae unicteric, pupils equal, round and reactive to light Ear-nose-throat: Oropharynx clear, dentition fair Lymphatic: No cervical, supraclavicular and axillary adenopathy Lungs no rales or rhonchi, good excursion bilaterally Heart regular rate and rhythm, no murmur appreciated Abd soft, nontender, positive bowel sounds, no liver or spleen tip palpated on exam, no fluid wave  MSK no focal spinal tenderness, no joint edema Neuro: non-focal, well-oriented, appropriate affect Breasts: Deferred   Lab Results  Component Value Date   WBC 10.2 06/15/2017  HGB 10.7 (L) 06/15/2017   HCT 32.3 (L) 06/15/2017   MCV 88.0 06/15/2017   PLT 261 06/15/2017   Lab Results  Component Value Date   FERRITIN 103 04/26/2017   IRON 83 04/26/2017   TIBC 258 04/26/2017   UIBC 175 04/26/2017   IRONPCTSAT 32 04/26/2017   Lab Results  Component Value Date   RBC 3.67 (L) 06/15/2017   No results found for: KPAFRELGTCHN, LAMBDASER, KAPLAMBRATIO No results found for: IGGSERUM, IGA, IGMSERUM No results found for: Kathrynn Ducking, MSPIKE, SPEI   Chemistry        Component Value Date/Time   NA 138 06/15/2017 0403   NA 144 04/26/2017 0830   NA 137 09/30/2016 0940   K 4.2 06/15/2017 0403   K 4.1 04/26/2017 0830   K 4.7 09/30/2016 0940   CL 108 06/15/2017 0403   CL 108 04/26/2017 0830   CO2 26 06/15/2017 0403   CO2 27 04/26/2017 0830   CO2 18 (L) 09/30/2016 0940   BUN 28 (H) 06/15/2017 0403   BUN 25 (H) 04/26/2017 0830   BUN 21.5 09/30/2016 0940   CREATININE 1.42 (H) 06/15/2017 0403   CREATININE 1.5 (H) 04/26/2017 0830   CREATININE 1.8 (H) 09/30/2016 0940      Component Value Date/Time   CALCIUM 9.1 06/15/2017 0403   CALCIUM 9.9 04/26/2017 0830   CALCIUM 10.4 09/30/2016 0940   ALKPHOS 141 (H) 06/15/2017 0403   ALKPHOS 124 (H) 04/26/2017 0830   ALKPHOS 147 09/30/2016 0940   AST 188 (H) 06/15/2017 0403   AST 35 04/26/2017 0830   AST 27 09/30/2016 0940   ALT 34 06/15/2017 0403   ALT 16 04/26/2017 0830   ALT 11 09/30/2016 0940   BILITOT 0.3 06/15/2017 0403   BILITOT 0.60 04/26/2017 0830   BILITOT 0.34 09/30/2016 0940      Impression and Plan: Ms. Choi is a very pleasant 83 yo African American female with stage IIIB adenocarcinoma of the descending colon, 1/25 lymph nodes positive with questionable liver met. She had RFA surgery on the right lobe liver lesion in January and has recuperated nicely.  Her CEA is slightly better this visit at 6.22.  We will repeat a PET scan 2 weeks prior to her follow-up with Dr. Marin Olp in 3 months.  Both she and her sister know to contact our office with any questions or concerns. We can certainly see her sooner if need be.   Laverna Peace, NP 3/8/20198:59 AM

## 2017-08-04 NOTE — Patient Instructions (Signed)
Implanted Port Home Guide An implanted port is a type of central line that is placed under the skin. Central lines are used to provide IV access when treatment or nutrition needs to be given through a person's veins. Implanted ports are used for long-term IV access. An implanted port may be placed because:  You need IV medicine that would be irritating to the small veins in your hands or arms.  You need long-term IV medicines, such as antibiotics.  You need IV nutrition for a long period.  You need frequent blood draws for lab tests.  You need dialysis.  Implanted ports are usually placed in the chest area, but they can also be placed in the upper arm, the abdomen, or the leg. An implanted port has two main parts:  Reservoir. The reservoir is round and will appear as a small, raised area under your skin. The reservoir is the part where a needle is inserted to give medicines or draw blood.  Catheter. The catheter is a thin, flexible tube that extends from the reservoir. The catheter is placed into a large vein. Medicine that is inserted into the reservoir goes into the catheter and then into the vein.  How will I care for my incision site? Do not get the incision site wet. Bathe or shower as directed by your health care provider. How is my port accessed? Special steps must be taken to access the port:  Before the port is accessed, a numbing cream can be placed on the skin. This helps numb the skin over the port site.  Your health care provider uses a sterile technique to access the port. ? Your health care provider must put on a mask and sterile gloves. ? The skin over your port is cleaned carefully with an antiseptic and allowed to dry. ? The port is gently pinched between sterile gloves, and a needle is inserted into the port.  Only "non-coring" port needles should be used to access the port. Once the port is accessed, a blood return should be checked. This helps ensure that the port  is in the vein and is not clogged.  If your port needs to remain accessed for a constant infusion, a clear (transparent) bandage will be placed over the needle site. The bandage and needle will need to be changed every week, or as directed by your health care provider.  Keep the bandage covering the needle clean and dry. Do not get it wet. Follow your health care provider's instructions on how to take a shower or bath while the port is accessed.  If your port does not need to stay accessed, no bandage is needed over the port.  What is flushing? Flushing helps keep the port from getting clogged. Follow your health care provider's instructions on how and when to flush the port. Ports are usually flushed with saline solution or a medicine called heparin. The need for flushing will depend on how the port is used.  If the port is used for intermittent medicines or blood draws, the port will need to be flushed: ? After medicines have been given. ? After blood has been drawn. ? As part of routine maintenance.  If a constant infusion is running, the port may not need to be flushed.  How long will my port stay implanted? The port can stay in for as long as your health care provider thinks it is needed. When it is time for the port to come out, surgery will be   done to remove it. The procedure is similar to the one performed when the port was put in. When should I seek immediate medical care? When you have an implanted port, you should seek immediate medical care if:  You notice a bad smell coming from the incision site.  You have swelling, redness, or drainage at the incision site.  You have more swelling or pain at the port site or the surrounding area.  You have a fever that is not controlled with medicine.  This information is not intended to replace advice given to you by your health care provider. Make sure you discuss any questions you have with your health care provider. Document  Released: 05/16/2005 Document Revised: 10/22/2015 Document Reviewed: 01/21/2013 Elsevier Interactive Patient Education  2017 [employer]  

## 2017-08-14 ENCOUNTER — Ambulatory Visit: Payer: Self-pay | Admitting: Neurology

## 2017-10-26 ENCOUNTER — Ambulatory Visit (HOSPITAL_COMMUNITY)
Admission: RE | Admit: 2017-10-26 | Discharge: 2017-10-26 | Disposition: A | Discharge status: Home / Self Care | Payer: Medicare Other | Source: Ambulatory Visit | Attending: Family | Admitting: Family

## 2017-10-26 DIAGNOSIS — R918 Other nonspecific abnormal finding of lung field: Secondary | ICD-10-CM | POA: Diagnosis not present

## 2017-10-26 DIAGNOSIS — N261 Atrophy of kidney (terminal): Secondary | ICD-10-CM | POA: Diagnosis not present

## 2017-10-26 DIAGNOSIS — I700 Atherosclerosis of aorta: Secondary | ICD-10-CM | POA: Diagnosis not present

## 2017-10-26 DIAGNOSIS — I2510 Atherosclerotic heart disease of native coronary artery without angina pectoris: Secondary | ICD-10-CM | POA: Insufficient documentation

## 2017-10-26 DIAGNOSIS — C787 Secondary malignant neoplasm of liver and intrahepatic bile duct: Secondary | ICD-10-CM | POA: Diagnosis present

## 2017-10-26 DIAGNOSIS — J341 Cyst and mucocele of nose and nasal sinus: Secondary | ICD-10-CM | POA: Insufficient documentation

## 2017-10-26 DIAGNOSIS — C189 Malignant neoplasm of colon, unspecified: Secondary | ICD-10-CM | POA: Diagnosis present

## 2017-10-26 DIAGNOSIS — E049 Nontoxic goiter, unspecified: Secondary | ICD-10-CM | POA: Insufficient documentation

## 2017-10-26 LAB — GLUCOSE, CAPILLARY: Glucose-Capillary: 99 mg/dL (ref 65–99)

## 2017-10-26 MED ORDER — FLUDEOXYGLUCOSE F - 18 (FDG) INJECTION
8.8700 | Freq: Once | INTRAVENOUS | Status: AC | PRN
  Administered 2017-10-26: 8.87 via INTRAVENOUS

## 2017-11-01 ENCOUNTER — Other Ambulatory Visit (HOSPITAL_COMMUNITY): Payer: Self-pay | Admitting: Interventional Radiology

## 2017-11-01 DIAGNOSIS — C787 Secondary malignant neoplasm of liver and intrahepatic bile duct: Principal | ICD-10-CM

## 2017-11-01 DIAGNOSIS — C189 Malignant neoplasm of colon, unspecified: Secondary | ICD-10-CM

## 2017-11-03 ENCOUNTER — Telehealth: Payer: Self-pay | Admitting: *Deleted

## 2017-11-03 ENCOUNTER — Inpatient Hospital Stay: Payer: Medicare Other

## 2017-11-03 ENCOUNTER — Inpatient Hospital Stay: Payer: Medicare Other | Attending: Hematology & Oncology | Admitting: Hematology & Oncology

## 2017-11-03 ENCOUNTER — Encounter: Payer: Self-pay | Admitting: Hematology & Oncology

## 2017-11-03 ENCOUNTER — Other Ambulatory Visit: Payer: Self-pay

## 2017-11-03 ENCOUNTER — Other Ambulatory Visit: Payer: Self-pay | Admitting: *Deleted

## 2017-11-03 VITALS — BP 164/68 | HR 63 | Temp 98.3°F | Resp 18

## 2017-11-03 DIAGNOSIS — C186 Malignant neoplasm of descending colon: Secondary | ICD-10-CM

## 2017-11-03 DIAGNOSIS — K769 Liver disease, unspecified: Secondary | ICD-10-CM | POA: Diagnosis not present

## 2017-11-03 DIAGNOSIS — Z79899 Other long term (current) drug therapy: Secondary | ICD-10-CM | POA: Diagnosis not present

## 2017-11-03 DIAGNOSIS — Z95828 Presence of other vascular implants and grafts: Secondary | ICD-10-CM

## 2017-11-03 DIAGNOSIS — C189 Malignant neoplasm of colon, unspecified: Secondary | ICD-10-CM

## 2017-11-03 DIAGNOSIS — E032 Hypothyroidism due to medicaments and other exogenous substances: Secondary | ICD-10-CM

## 2017-11-03 DIAGNOSIS — D500 Iron deficiency anemia secondary to blood loss (chronic): Secondary | ICD-10-CM

## 2017-11-03 DIAGNOSIS — C787 Secondary malignant neoplasm of liver and intrahepatic bile duct: Principal | ICD-10-CM

## 2017-11-03 LAB — CMP (CANCER CENTER ONLY)
ALT: 9 U/L (ref 0–55)
AST: 36 U/L — ABNORMAL HIGH (ref 5–34)
Albumin: 3.7 g/dL (ref 3.5–5.0)
Alkaline Phosphatase: 204 U/L — ABNORMAL HIGH (ref 40–150)
Anion gap: 9 (ref 3–11)
BUN: 38 mg/dL — ABNORMAL HIGH (ref 7–26)
CO2: 23 mmol/L (ref 22–29)
Calcium: 10.9 mg/dL — ABNORMAL HIGH (ref 8.4–10.4)
Chloride: 105 mmol/L (ref 98–109)
Creatinine: 1.82 mg/dL — ABNORMAL HIGH (ref 0.60–1.10)
GFR, Est AFR Am: 29 mL/min — ABNORMAL LOW (ref >60)
GFR, Estimated: 25 mL/min — ABNORMAL LOW (ref >60)
Glucose, Bld: 90 mg/dL (ref 70–140)
Potassium: 4.3 mmol/L (ref 3.5–5.1)
Sodium: 137 mmol/L (ref 136–145)
Total Bilirubin: 0.3 mg/dL (ref 0.2–1.2)
Total Protein: 8.6 g/dL — ABNORMAL HIGH (ref 6.4–8.3)

## 2017-11-03 LAB — LACTATE DEHYDROGENASE: LDH: 227 U/L (ref 125–245)

## 2017-11-03 LAB — CBC WITH DIFFERENTIAL (CANCER CENTER ONLY)
Basophils Absolute: 0.1 10*3/uL (ref 0.0–0.1)
Basophils Relative: 1 %
Eosinophils Absolute: 0.6 10*3/uL — ABNORMAL HIGH (ref 0.0–0.5)
Eosinophils Relative: 8 %
HCT: 35.6 % (ref 34.8–46.6)
Hemoglobin: 11.8 g/dL (ref 11.6–15.9)
Lymphocytes Relative: 19 %
Lymphs Abs: 1.6 10*3/uL (ref 0.9–3.3)
MCH: 29.6 pg (ref 26.0–34.0)
MCHC: 33.1 g/dL (ref 32.0–36.0)
MCV: 89.4 fL (ref 81.0–101.0)
Monocytes Absolute: 1.4 10*3/uL — ABNORMAL HIGH (ref 0.1–0.9)
Monocytes Relative: 16 %
Neutro Abs: 4.6 10*3/uL (ref 1.5–6.5)
Neutrophils Relative %: 56 %
Platelet Count: 316 10*3/uL (ref 145–400)
RBC: 3.98 MIL/uL (ref 3.70–5.32)
RDW: 16 % — ABNORMAL HIGH (ref 11.1–15.7)
WBC Count: 8.2 10*3/uL (ref 3.9–10.0)

## 2017-11-03 LAB — CEA (IN HOUSE-CHCC): CEA (CHCC-In House): 17.26 ng/mL — ABNORMAL HIGH (ref 0.00–5.00)

## 2017-11-03 LAB — TSH: TSH: 10.912 u[IU]/mL — ABNORMAL HIGH (ref 0.308–3.960)

## 2017-11-03 MED ORDER — HEPARIN SOD (PORK) LOCK FLUSH 100 UNIT/ML IV SOLN
500.0000 [IU] | Freq: Once | INTRAVENOUS | Status: AC
  Administered 2017-11-03: 500 [IU] via INTRAVENOUS
  Filled 2017-11-03: qty 5

## 2017-11-03 MED ORDER — SODIUM CHLORIDE 0.9% FLUSH
10.0000 mL | INTRAVENOUS | Status: DC | PRN
  Administered 2017-11-03: 10 mL via INTRAVENOUS
  Filled 2017-11-03: qty 10

## 2017-11-03 MED ORDER — LEVOTHYROXINE SODIUM 50 MCG PO TABS: 50.0000 ug | ORAL_TABLET | Freq: Every day | ORAL | 3 refills | Status: DC

## 2017-11-03 NOTE — Telephone Encounter (Addendum)
Patient is aware of results. Patient is aware of new prescription. Pharmacy confirmed.   ----- Message from [name], MD sent at 11/03/2017  1:54 PM EDT ----- Call - the thyroid is underactive.  Need to increase the synthroid to 50 mcg po q day.  Please call this in!!  Thanks!!  Laurey Arrow

## 2017-11-03 NOTE — Progress Notes (Signed)
Hematology and Oncology Follow Up Visit  Melanie [name] [id number] [birth year] 83 y.o. 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  She really looks fantastic.  It is amazing how much of her hair has come back.  She has gained quite a bit of weight.  I am just happy that her quality of life is doing much better.  We did go ahead and get a PET scan on her.  This was done on Oct 26, 2017.  The report is quite troublesome.  The radiologist says that there is activity along the anterior margin of the ablation site that is suspicious for local tumor recurrence.  There is mildly hypermetabolic mediastinal and left infrahilar and porta hepatis lymph nodes.  Her CEA level today was 17.  She feels good.  She is eating well.  She has had no nausea or vomiting.  She has had no cough.  There is been no bleeding.  We have given her iron in the past.  This is helped her.  Her TSH is high.  Her TSH is 11.  Her Synthroid dose will need to be increased from 25 mcg up to 50 mcg.  We are awaiting the iron studies.  She has a busy summer planned.  She wants to go up to [place] to visit some family.  Overall, I said that her performance status is ECOG 1-2.    Medications:  Allergies as of 11/03/2017   No Known Allergies     Medication List        Accurate as of 11/03/17  9:52 AM. Always use your most recent med list.          amlodipine-atorvastatin 10-10 MG tablet Commonly known as:  CADUET Take 1 tablet by mouth daily.   CALTRATE 600+D PO Take 1 tablet by mouth at bedtime.   ferrous sulfate 325 (65 FE) MG tablet Take 325 mg by mouth daily with breakfast.   levothyroxine 25 MCG tablet Commonly known as:  SYNTHROID, LEVOTHROID Take 25 mcg by mouth daily before breakfast.   lidocaine-prilocaine cream Commonly known as:  EMLA Apply 1 application topically as needed.   metoprolol tartrate 25 MG tablet Commonly known as:  LOPRESSOR Take 50 mg by mouth 2 (two) times daily.   multivitamin with minerals Tabs tablet Take 1 tablet by mouth daily.   potassium chloride SA 20 MEQ tablet Commonly known as:  K-DUR,KLOR-CON Take 1 tablet (20 mEq total) by mouth daily.   VITAMIN B-12 PO Take 1 tablet by mouth daily.       Allergies: No Known Allergies  Past Medical History, Surgical history, Social history, and Family History were reviewed and updated.  Review of Systems: Review of Systems  Constitutional: Negative.   HENT: Negative.   Eyes: Negative.   Respiratory: Negative.   Cardiovascular: Negative.   Gastrointestinal: Negative.   Genitourinary: Negative.   Musculoskeletal: Negative.   Skin: Negative.   Neurological: Negative.   Endo/Heme/Allergies: Negative.   Psychiatric/Behavioral: Negative.    Marland Kitchen   Physical Exam:  oral temperature is 98.3 F (36.8 C). Her blood pressure is 164/68 (abnormal) and her pulse is 63. Her respiration is 18.  Wt Readings from Last 3 Encounters:  08/04/17 178 lb (80.7 kg)  07/18/17 180 lb (81.6 kg)  07/13/17 168 lb (76.2 kg)    Physical Exam  Constitutional: She is oriented to person, place, and time.  HENT:  Head: Normocephalic and atraumatic.  Mouth/Throat: Oropharynx is clear and moist.  Eyes: Pupils are equal, round, and reactive to light. EOM are normal.  Neck: Normal range of motion.  Cardiovascular: Normal rate, regular rhythm and normal heart sounds.  Pulmonary/Chest: Effort normal and breath sounds normal.  Abdominal: Soft. Bowel sounds are normal.  Musculoskeletal: Normal range of motion. She exhibits no edema, tenderness or deformity.  Lymphadenopathy:    She has no  cervical adenopathy.  Neurological: She is alert and oriented to person, place, and time.  Skin: Skin is warm and dry. No rash noted. No erythema.  Psychiatric: She has a normal mood and affect. Her behavior is normal. Judgment and thought content normal.  Vitals reviewed.    Lab Results  Component Value Date   WBC 8.2 11/03/2017   HGB 11.8 11/03/2017   HCT 35.6 11/03/2017   MCV 89.4 11/03/2017   PLT 316 11/03/2017   Lab Results  Component Value Date   FERRITIN 103 04/26/2017   IRON 83 04/26/2017   TIBC 258 04/26/2017   UIBC 175 04/26/2017   IRONPCTSAT 32 04/26/2017   Lab Results  Component Value Date   RBC 3.98 11/03/2017   No results found for: KPAFRELGTCHN, LAMBDASER, KAPLAMBRATIO No results found for: IGGSERUM, IGA, IGMSERUM No results found for: Odetta Pink, SPEI   Chemistry      Component Value Date/Time   NA 139 08/04/2017 0831   NA 144 04/26/2017 0830   NA 137 09/30/2016 0940   K 3.9 08/04/2017 0831   K 4.1 04/26/2017 0830   K 4.7 09/30/2016 0940   CL 104 08/04/2017 0831   CL 108 04/26/2017 0830   CO2 27 08/04/2017 0831   CO2 27 04/26/2017 0830   CO2 18 (L) 09/30/2016 0940   BUN 31 (H) 08/04/2017 0831   BUN 25 (H) 04/26/2017 0830   BUN 21.5 09/30/2016 0940   CREATININE 1.50 (H) 08/04/2017 0831   CREATININE 1.5 (H) 04/26/2017 0830   CREATININE 1.8 (H) 09/30/2016 0940      Component Value Date/Time   CALCIUM 10.7 (H) 08/04/2017 0831   CALCIUM 9.9 04/26/2017 0830   CALCIUM 10.4 09/30/2016 0940   ALKPHOS 367 (H) 08/04/2017 0831   ALKPHOS 124 (H) 04/26/2017 0830   ALKPHOS 147 09/30/2016 0940   AST 61 (H) 08/04/2017 0831   AST 27 09/30/2016 0940   ALT 28 08/04/2017 0831   ALT 16 04/26/2017 0830   ALT 11 09/30/2016 0940   BILITOT 0.6 08/04/2017 0831   BILITOT 0.34 09/30/2016 0940      Impression and Plan: Melanie [name] is a very pleasant 83 yo African American female with stage IIIB (T3N1M0)  adenocarcinoma of the descending colon.  We tried her on systemic chemotherapy in the adjuvant setting and she tolerated this incredibly poorly.  She only had 3 cycles of FOLFOX.  She then recurred quickly.  We biopsied a liver lesion.  This was positive for adenocarcinoma.  She then underwent radio frequency ablation.  This was done in January 2019.  I have to suspect that she probably does have recurrence again.  Her CEA is now 27.  She is asymptomatic.  She would not want any additional chemotherapy.  Again she had a very tough time with past treatment.  I would like to see back in her back in 2 months.  I will get a PET scan on her to see if there are any changes.  I am just happy that her quality of life is doing well right now.  We will have to follow-up on her iron studies.  I would think that they should be okay as her MCV is a little bit better.  Y see her sooner if need be.   [name], MD 6/7/20199:52 AM

## 2017-11-08 ENCOUNTER — Ambulatory Visit
Admission: RE | Admit: 2017-11-08 | Discharge: 2017-11-08 | Disposition: A | Discharge status: Home / Self Care | Payer: Medicare Other | Source: Ambulatory Visit | Attending: Interventional Radiology | Admitting: Interventional Radiology

## 2017-11-08 ENCOUNTER — Encounter: Payer: Self-pay | Admitting: Radiology

## 2017-11-08 DIAGNOSIS — C189 Malignant neoplasm of colon, unspecified: Secondary | ICD-10-CM

## 2017-11-08 DIAGNOSIS — C787 Secondary malignant neoplasm of liver and intrahepatic bile duct: Principal | ICD-10-CM

## 2017-11-08 HISTORY — PX: IR RADIOLOGIST EVAL & MGMT: IMG5224

## 2017-11-08 NOTE — Progress Notes (Signed)
Chief Complaint: Status post microwave thermal ablation of right lobe hepatic metastasis from colon cancer on 06/14/2017.  History of Present Illness: Melanie [name] is a 83 y.o. female with a history of stage IIIB adenocarcinoma of the descending colon with positive lymph nodes.  She did not tolerate chemotherapy well which had to be discontinued during her third cycle.  A progressive metastatic lesion within segment 8 of the right lobe was treated by microwave thermal ablation on 06/14/2017.  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  Symptoms have now improved significantly after physical therapy and she is walking normally.  She denies abdominal pain and her appetite has been good with no weight loss.  CEA level went from 8.44 down to 6.22 following ablation.  Recent CEA on 11/03/2017 has increased to 17.26.  A PET scan on 10/26/2017 demonstrated increased metabolic activity along the anterior margin of hepatic ablation.  There is also was some increased metabolic activity within periportal, mediastinal and left hilar lymph nodes.  Past Medical History:  Diagnosis Date  . Arthritis   . Chronic thyroiditis   . Colon cancer ([place])    colon  . Colostomy in place [facility])    history of, no longer in place  . Essential hypertension   . Goals of care, counseling/discussion 09/30/2016  . Goiter   . Hypothyroidism     Past Surgical History:  Procedure Laterality Date  . COLON SURGERY  07/2016   colostomy, colorectal cancer, s/p chemotherapy  . COLONOSCOPY    . COLOSTOMY REVERSAL    . IR RADIOLOGIST EVAL & MGMT  04/27/2017  . IR RADIOLOGIST EVAL & MGMT  07/13/2017  . IR RADIOLOGIST EVAL & MGMT  11/08/2017  . NEPHRECTOMY  07/2016  . PORTA CATH INSERTION    . RADIOFREQUENCY ABLATION N/A 06/14/2017   Procedure: CT MICROWAVE THERMAL ABLATION;  Surgeon: Aletta Edouard, MD;  Location: [facility];   Service: Anesthesiology;  Laterality: N/A;  . SPLENECTOMY  07/2016    Allergies: Patient has no known allergies.  Medications: Prior to Admission medications   Medication Sig Start Date End Date Taking? Authorizing Provider  amlodipine-atorvastatin (CADUET) 10-10 MG tablet Take 1 tablet by mouth daily.    [provider]  Calcium Carbonate-Vitamin D (CALTRATE 600+D PO) Take 1 tablet by mouth at bedtime.    [provider]  Cyanocobalamin (VITAMIN B-12 PO) Take 1 tablet by mouth daily.    [provider]  ferrous sulfate 325 (65 FE) MG tablet Take 325 mg by mouth daily with breakfast.    [provider]  levothyroxine (SYNTHROID, LEVOTHROID) 50 MCG tablet Take 1 tablet (50 mcg total) by mouth daily before breakfast. 11/03/17   [name], MD  lidocaine-prilocaine (EMLA) cream Apply 1 application topically as needed. Patient taking differently: Apply 1 application topically once as needed (prior to port access).  10/10/16   [name], MD  metoprolol tartrate (LOPRESSOR) 25 MG tablet Take 50 mg by mouth 2 (two) times daily.     [provider]  Multiple Vitamin (MULTIVITAMIN WITH MINERALS) TABS tablet Take 1 tablet by mouth daily.    [provider]  potassium chloride SA (K-DUR,KLOR-CON) 20 MEQ tablet Take 1 tablet (20 mEq total) by mouth daily. 02/01/17   [name], MD     Family History  Problem Relation Age of Onset  . Colon  cancer Mother 58       Died last year at 8 years old  . Colon cancer Brother 36    Social History   Socioeconomic History  . Marital status: Widowed    Spouse name: Not on file  . Number of children: 0  . Years of education: 42  . Highest education level: Not on file  Occupational History  . 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 Financial resource strain: Not on file  . Food insecurity:    Worry: Not on file    Inability: Not on file  . Transportation needs:     Medical: Not on file    Non-medical: Not on file  Tobacco Use  . Smoking status: Former Smoker    Packs/day: 1.00    Years: 35.00    Pack years: 35.00    Last attempt to quit: 1993    Years since quitting: 26.4  . Smokeless tobacco: Never Used  Substance and Sexual Activity  . Alcohol use: No  . Drug use: No  . Sexual activity: Not on file  Lifestyle  . Physical activity:    Days per week: Not on file    Minutes per session: Not on file  . Stress: Not on file  Relationships  . Social connections:    Talks on phone: Not on file    Gets together: Not on file    Attends religious service: Not on file    Active member of club or organization: Not on file    Attends meetings of clubs or organizations: Not on file    Relationship status: Not on file  Other Topics Concern  . Not on file  Social History Narrative   Lives alone   Retired   Caffeine- 1 cup daily   No children   High school education    ECOG Status: 1 - Symptomatic but completely ambulatory  Review of Systems: A 12 point ROS discussed and pertinent positives are indicated in the HPI above.  All other systems are negative.  Review of Systems  Respiratory: Negative.   Cardiovascular: Negative.   Gastrointestinal: Negative.   Genitourinary: Negative.   Musculoskeletal: Negative.   Neurological: Positive for numbness.    Vital Signs: BP (!) 146/79   Pulse 67   Temp 98.6 F (37 C) (Oral)   Resp 16   Ht _0  (1.626 m)   Wt 191 lb (86.6 kg)   SpO2 97%   BMI 32.79 kg/m   Physical Exam  Constitutional: She is oriented to person, place, and time. She appears well-developed and well-nourished. No distress.  Abdominal: Soft. She exhibits no distension and no mass. There is no tenderness. There is no rebound and no guarding.  Musculoskeletal: She exhibits no edema.  Neurological: She is alert and oriented to person, place, and time.  Skin: She is not diaphoretic.  Vitals reviewed.   Imaging: Nm Pet  Image Restag (ps) Skull Base To Thigh  Result Date: 10/26/2017 CLINICAL DATA:  Subsequent treatment strategy for colon cancer metastatic to the liver. Solitary hepatic metastatic lesion treated with arm ablation on 06/14/2017. EXAM: NUCLEAR MEDICINE PET SKULL BASE TO THIGH TECHNIQUE: 8.9 mCi F-18 FDG was injected intravenously. Full-ring PET imaging was performed from the skull base to thigh after the radiotracer. CT data was obtained and used for attenuation correction and anatomic localization. Fasting blood glucose: 99 mg/dl COMPARISON:  Multiple exams, including 04/24/2017 FINDINGS: Mediastinal blood pool activity: SUV max 2.4 NECK:  Diffuse thyroid hypermetabolic activity and goiter. For example the right thyroid lobe has a maximum SUV of 11.2, formerly 11.5. Incidental CT findings: Mucous retention cyst in left maxillary sinus. Probable sebaceous cyst or similar small subcutaneous cystic lesion along the left facial tissues on image 11/4. CHEST: Right lower paratracheal node 1.0 cm in short axis with maximum SUV 4.6, previously the same size with maximum SUV 3.0. AP window lymph node 1.1 cm in short axis on image 57/4, previously similar, maximum SUV 4.6 (previously 2.9). Faint left infrahilar activity, maximum SUV 4.1, previously 3.0. Incidental CT findings: 0.8 by 0.5 cm right middle lobe pulmonary nodule on image 34/8, formerly 0.6 by 0.5 cm, no appreciable accentuated metabolic activity but below sensitive PET-CT size thresholds. Bilateral hazy ground-glass opacity in the lungs with mild interstitial accentuation. Coronary, aortic arch, and branch vessel atherosclerotic vascular disease. Right Port-A-Cath tip: Right atrium. ABDOMEN/PELVIS: Along the anterior margin of the ablation site there is abnormal crescentic and nodular hypermetabolic activity, maximum SUV 13.9, compatible with local recurrence. Central high density along the ablation site likely represents proteinaceous fluid and there is a halo of  surrounding lower density some of which includes the tumor. No additional foci of hypermetabolic activity indicating tumor are identified in the liver. Suspected adenopathy in the porta hepatis, indistinctly marginated, maximum SUV 4.8, increased from the prior PET-CT. Background hepatic activity has an SUV of about 3.2. Incidental CT findings: Splenectomy. Severely atrophic left kidney with adjacent scarring. Aortoiliac atherosclerotic vascular disease. Postoperative findings in the right colon and in the proximal sigmoid colon. Appendix normal. SKELETON: No significant abnormal hypermetabolic activity in this region. Incidental CT findings: none IMPRESSION: 1. Unfortunately there is crescentic and nodular hypermetabolic activity along the anterior margin of the ablation site highly suspicious for local tumor recurrence, maximum SUV 13.9. 2. Mildly hypermetabolic mediastinal and left infrahilar adenopathy along with porta hepatis adenopathy. This is significantly lower in activity than the hepatic tumor, but still raises concern for potential malignancy. Thyroid goiter with diffuse high activity throughout the thyroid parenchyma compatible with chronic thyroiditis. 3. 8 by 5 mm right middle lobe pulmonary nodule formerly measured 6 by 5 mm, although differences in size could be due to motion artifact. No current hypermetabolic activity but may merit surveillance. 4. Other imaging findings of potential clinical significance: Mucous retention cyst in the left maxillary sinus. Bilateral hazy ground-glass opacities in the lungs with mild interstitial accentuation, come query mild congestive heart failure. Aortic Atherosclerosis (ICD10-I70.0). Coronary atherosclerosis. Severely atrophic left kidney. Postoperative findings in the colon. Electronically Signed   By: Van Clines M.D.   On: 10/26/2017 15:17   Ir Radiologist Eval & Mgmt  Result Date: 11/08/2017 Please refer to notes tab for details about  interventional procedure. (Op Note)   Labs:  CBC: Recent Labs    06/12/17 1140 06/15/17 0403 08/04/17 0831 11/03/17 0905  WBC 8.1 10.2 6.0 8.2  HGB 12.0 10.7* 12.1 11.8  HCT 36.4 32.3* 35.7 35.6  PLT 368 261 308 316    COAGS: Recent Labs    12/06/16 1541 06/12/17 1140  INR 1.18 1.09    BMP: Recent Labs    12/10/16 0633  06/12/17 1140 06/15/17 0403 08/04/17 0831 11/03/17 0905  NA 137   < > 138 138 139 137  K 3.6   < > 3.9 4.2 3.9 4.3  CL 115*   < > 108 108 104 105  CO2 18*   < > _0 GLUCOSE 86   < >  90 122* 96 90  BUN 17   < > 29* 28* 31* 38*  CALCIUM 8.6*   < > 9.9 9.1 10.7* 10.9*  CREATININE 1.32*   < > 1.31* 1.42* 1.50* 1.82*  GFRNONAA 37*  --  37* 34*  --  25*  GFRAA 43*  --  43* 39*  --  29*   < > = values in this interval not displayed.    LIVER FUNCTION TESTS: Recent Labs    06/12/17 1140 06/15/17 0403 08/04/17 0831 11/03/17 0905  BILITOT 0.8 0.3 0.6 0.3  AST 41 188* 61* 36*  ALT 16 34 28 9  ALKPHOS 172* 141* 367* 204*  PROT 7.8 7.0 8.3* 8.6*  ALBUMIN 3.8 3.4* 3.5 3.7    Assessment and Plan:  I met with Mrs. Ratliff and her sister.  We reviewed the recent PET scan.  Findings are suspicious for some residual tumor along the anterior margin of prior ablation.  The majority of the 3.5 cm metastatic lesion appears ablated.  The significant rise in CEA is concerning and there may now be metastatic disease in lymph nodes in the porta hepatis and in the chest.  I told her that I am not sure she would definitely benefit from a second ablation procedure of the liver.  I told her that I agree with Dr. Marin Olp that interval observation over the next few months with repeat CEA level and another PET scan would be helpful in determining whether to pursue systemic therapy versus potential repeat ablation of the liver.  Prior to considering repeat ablation, another MRI of the abdomen would be needed to more clearly delineate location of abnormal  enhancing tumor.  I will meet back with Mrs. Seivert after her next follow-up with Dr. Marin Olp.   Electronically SignedAletta Edouard T 11/08/2017, 9:13 AM   I spent a total of 15 Minutes in face to face in clinical consultation, greater than 50% of which was counseling/coordinating care post ablation of a liver metastasis.

## 2017-12-18 ENCOUNTER — Other Ambulatory Visit (HOSPITAL_COMMUNITY): Payer: Self-pay | Admitting: Interventional Radiology

## 2017-12-18 DIAGNOSIS — R160 Hepatomegaly, not elsewhere classified: Secondary | ICD-10-CM

## 2018-01-01 ENCOUNTER — Ambulatory Visit (HOSPITAL_COMMUNITY)
Admission: RE | Admit: 2018-01-01 | Discharge: 2018-01-01 | Disposition: A | Discharge status: Home / Self Care | Payer: Medicare Other | Source: Ambulatory Visit | Attending: Hematology & Oncology | Admitting: Hematology & Oncology

## 2018-01-01 DIAGNOSIS — I700 Atherosclerosis of aorta: Secondary | ICD-10-CM | POA: Insufficient documentation

## 2018-01-01 DIAGNOSIS — D490 Neoplasm of unspecified behavior of digestive system: Secondary | ICD-10-CM | POA: Diagnosis not present

## 2018-01-01 DIAGNOSIS — C186 Malignant neoplasm of descending colon: Secondary | ICD-10-CM | POA: Diagnosis present

## 2018-01-01 DIAGNOSIS — R590 Localized enlarged lymph nodes: Secondary | ICD-10-CM | POA: Insufficient documentation

## 2018-01-01 LAB — GLUCOSE, CAPILLARY: Glucose-Capillary: 94 mg/dL (ref 70–99)

## 2018-01-01 MED ORDER — FLUDEOXYGLUCOSE F - 18 (FDG) INJECTION
9.3900 | Freq: Once | INTRAVENOUS | Status: AC | PRN
  Administered 2018-01-01: 9.39 via INTRAVENOUS

## 2018-01-02 ENCOUNTER — Ambulatory Visit
Admission: RE | Admit: 2018-01-02 | Discharge: 2018-01-02 | Disposition: A | Discharge status: Home / Self Care | Payer: Medicare Other | Source: Ambulatory Visit | Attending: Interventional Radiology | Admitting: Interventional Radiology

## 2018-01-02 ENCOUNTER — Encounter: Payer: Self-pay | Admitting: Radiology

## 2018-01-02 DIAGNOSIS — R160 Hepatomegaly, not elsewhere classified: Secondary | ICD-10-CM

## 2018-01-02 HISTORY — PX: IR RADIOLOGIST EVAL & MGMT: IMG5224

## 2018-01-02 NOTE — Progress Notes (Signed)
Chief Complaint: Status post thermal ablation of right lobe hepatic metastasis from colon carcinoma on 06/14/2017.  History of Present Illness: Melanie [name] is a 83 y.o. female who returns for follow-up.  After thermal ablation of a segment 8 right lobe metastasis in January, CEA level did initially decrease.  However, her CEA increased in June with PET scan on 10/26/2017 demonstrating increased metabolic activity along the anterior margin of prior ablation consistent with residual tumor.  There also was evidence of periportal, right paratracheal, AP window and left infrahilar lymph node metastases.  Decision was made to perform a follow-up in 3 months with repeat CEA.  This CEA level is pending and the patient has a follow-up visit with Dr. Marin Olp this Friday.  The patient continues to be very functional with no significant symptoms.  Past Medical History:  Diagnosis Date  . Arthritis   . Chronic thyroiditis   . Colon cancer ([place])    colon  . Colostomy in place [facility])    history of, no longer in place  . Essential hypertension   . Goals of care, counseling/discussion 09/30/2016  . Goiter   . Hypothyroidism     Past Surgical History:  Procedure Laterality Date  . COLON SURGERY  07/2016   colostomy, colorectal cancer, s/p chemotherapy  . COLONOSCOPY    . COLOSTOMY REVERSAL    . IR RADIOLOGIST EVAL & MGMT  04/27/2017  . IR RADIOLOGIST EVAL & MGMT  07/13/2017  . IR RADIOLOGIST EVAL & MGMT  11/08/2017  . IR RADIOLOGIST EVAL & MGMT  01/02/2018  . NEPHRECTOMY  07/2016  . PORTA CATH INSERTION    . RADIOFREQUENCY ABLATION N/A 06/14/2017   Procedure: CT MICROWAVE THERMAL ABLATION;  Surgeon: Aletta Edouard, MD;  Location: [facility];  Service: Anesthesiology;  Laterality: N/A;  . SPLENECTOMY  07/2016    Allergies: Patient has no known allergies.  Medications: Prior to Admission medications   Medication Sig Start Date End Date Taking? Authorizing Provider  amlodipine-atorvastatin  (CADUET) 10-10 MG tablet Take 1 tablet by mouth daily.   Yes [provider]  Calcium Carbonate-Vitamin D (CALTRATE 600+D PO) Take 1 tablet by mouth at bedtime.   Yes [provider]  Cyanocobalamin (VITAMIN B-12 PO) Take 1 tablet by mouth daily.   Yes [provider]  ferrous sulfate 325 (65 FE) MG tablet Take 325 mg by mouth daily with breakfast.   Yes [provider]  levothyroxine (SYNTHROID, LEVOTHROID) 50 MCG tablet Take 1 tablet (50 mcg total) by mouth daily before breakfast. 11/03/17  Yes Ennever, Rudell Cobb, MD  lidocaine-prilocaine (EMLA) cream Apply 1 application topically as needed. Patient taking differently: Apply 1 application topically once as needed (prior to port access).  10/10/16  Yes [name], MD  metoprolol tartrate (LOPRESSOR) 25 MG tablet Take 50 mg by mouth 2 (two) times daily.    Yes [provider]  Multiple Vitamin (MULTIVITAMIN WITH MINERALS) TABS tablet Take 1 tablet by mouth daily.   Yes [provider]  potassium chloride SA (K-DUR,KLOR-CON) 20 MEQ tablet Take 1 tablet (20 mEq total) by mouth daily. 02/01/17  Yes [name], MD     Family History  Problem Relation Age of Onset  . Colon cancer Mother 47       Died last year at 97 years old  . Colon cancer Brother 30    Social History   Socioeconomic History  . Marital status: Widowed    Spouse name:  Not on file  . Number of children: 0  . Years of education: 6  . Highest education level: Not on file  Occupational History  . 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 Financial resource strain: Not on file  . Food insecurity:    Worry: Not on file    Inability: Not on file  . Transportation needs:    Medical: Not on file    Non-medical: Not on file  Tobacco Use  . Smoking status: Former Smoker    Packs/day: 1.00    Years: 35.00    Pack years: 35.00    Last attempt to quit: 1993    Years since quitting: 26.6  . Smokeless  tobacco: Never Used  Substance and Sexual Activity  . Alcohol use: No  . Drug use: No  . Sexual activity: Not on file  Lifestyle  . Physical activity:    Days per week: Not on file    Minutes per session: Not on file  . Stress: Not on file  Relationships  . Social connections:    Talks on phone: Not on file    Gets together: Not on file    Attends religious service: Not on file    Active member of club or organization: Not on file    Attends meetings of clubs or organizations: Not on file    Relationship status: Not on file  Other Topics Concern  . Not on file  Social History Narrative   Lives alone   Retired   Caffeine- 1 cup daily   No children   High school education    ECOG Status: 1 - Symptomatic but completely ambulatory  Review of Systems: A 12 point ROS discussed and pertinent positives are indicated in the HPI above.  All other systems are negative.  Review of Systems  Constitutional: Positive for fatigue. Negative for appetite change, fever and unexpected weight change.  Respiratory: Negative.   Cardiovascular: Negative.   Gastrointestinal: Negative.   Genitourinary: Negative.   Musculoskeletal: Negative.   Neurological: Negative.     Vital Signs: BP 139/75   Pulse 64   Temp 98.3 F (36.8 C) (Oral)   Resp 14   Ht 5' 4"  (1.626 m)   Wt 191 lb (86.6 kg)   SpO2 96%   BMI 32.79 kg/m   Physical Exam  Constitutional: She is oriented to person, place, and time. No distress.  Abdominal: Soft. She exhibits no distension. There is no tenderness.  Neurological: She is alert and oriented to person, place, and time.  Skin: She is not diaphoretic.  Vitals reviewed.   Imaging: Nm Pet Image Restag (ps) Skull Base To Thigh  Result Date: 01/01/2018 CLINICAL DATA:  Subsequent treatment strategy for colon cancer. EXAM: NUCLEAR MEDICINE PET SKULL BASE TO THIGH TECHNIQUE: 9.39 mCi F-18 FDG was injected intravenously. Full-ring PET imaging was performed from the  skull base to thigh after the radiotracer. CT data was obtained and used for attenuation correction and anatomic localization. Fasting blood glucose: 94 mg/dl COMPARISON:  10/26/2017 FINDINGS: Mediastinal blood pool activity: SUV max 2.95 NECK: Enlarged thyroid gland is again identified containing diffuse radiotracer uptake. The appearance is similar to previous exam within SUV max of 12.16. Incidental CT findings: none CHEST: The index right paratracheal lymph node measures 1 cm and has an SUV max of 5.47. Previously this measured 1 cm within SUV max of 3.0. No pleural effusion identified. Diminished lung volumes are identified bilaterally. Bilateral hazy ground-glass opacities  with interstitial accentuation is again noted. Stable right middle lobe pulmonary nodule is too small to reliably characterize measuring 6 mm, image 34/8. No new pulmonary nodules identified. Incidental CT findings: Aortic atherosclerosis. Three vessel coronary artery atherosclerotic calcifications identified. ABDOMEN/PELVIS: Previously ablated tumor measures 7.0 x 5.9 cm with an SUV max of 13.8. On the previous exam this measured 5.8 x 5.9 cm with an SUV max of 13.9. Hypermetabolic porta hepatis node measures 2.1 cm and has an SUV max of 4.97. The previously this measured 2.1 cm and had an SUV max of 4.75. Incidental CT findings: Previous splenectomy. Aortic atherosclerosis noted. Atrophic left kidney is again noted. SKELETON: No focal hypermetabolic activity to suggest skeletal metastasis. Incidental CT findings: none IMPRESSION: 1. Previously treated tumor within right lobe of liver has increased in size in the interval. Similar degree of hypermetabolism with an SUV max of 13.8. 2. No significant change in size or FDG uptake associated with hypermetabolic porta hepatis adenopathy. 3. Mildly increased FDG uptake associated with morphologically stable right paratracheal lymph node 4.  Aortic Atherosclerosis (ICD10-I70.0). 5. Diffuse uptake  throughout the thyroid gland which appears enlarged. Correlation with patient's thyroid function advised. Electronically Signed   By: Kerby Moors M.D.   On: 01/01/2018 11:06   Ir Radiologist Eval & Mgmt  Result Date: 01/02/2018 Please refer to notes tab for details about interventional procedure. (Op Note)   Labs:  CBC: Recent Labs    06/12/17 1140 06/15/17 0403 08/04/17 0831 11/03/17 0905  WBC 8.1 10.2 6.0 8.2  HGB 12.0 10.7* 12.1 11.8  HCT 36.4 32.3* 35.7 35.6  PLT 368 261 308 316    COAGS: Recent Labs    06/12/17 1140  INR 1.09    BMP: Recent Labs    06/12/17 1140 06/15/17 0403 08/04/17 0831 11/03/17 0905  NA 138 138 139 137  K 3.9 4.2 3.9 4.3  CL 108 108 104 105  CO2 24 26 27 23   GLUCOSE 90 122* 96 90  BUN 29* 28* 31* 38*  CALCIUM 9.9 9.1 10.7* 10.9*  CREATININE 1.31* 1.42* 1.50* 1.82*  GFRNONAA 37* 34*  --  25*  GFRAA 43* 39*  --  29*    LIVER FUNCTION TESTS: Recent Labs    06/12/17 1140 06/15/17 0403 08/04/17 0831 11/03/17 0905  BILITOT 0.8 0.3 0.6 0.3  AST 41 188* 61* 36*  ALT 16 34 28 9  ALKPHOS 172* 141* 367* 204*  PROT 7.8 7.0 8.3* 8.6*  ALBUMIN 3.8 3.4* 3.5 3.7    TUMOR MARKERS: CEA 17.26 on 11/03/17   Assessment and Plan:  I met with Mrs. Florek and her sister.  We reviewed the recent restaging PET scan dated 01/01/2018.  Unfortunately, the area of tumor recurrence surrounding the ablation defect in the right lobe of the liver now appears considerably larger by PET scan compared to the 5/30 scan.  The right paratracheal lymph node metastasis is similar in size but appears of increased metabolic activity compared to the prior PET scan.  Activity and AP window and left hilar lymph nodes appear similar when reviewing source images.  Activity in a periportal lymph node appears stable.  I discussed future treatment options with Mrs. Eisenhardt.  For the liver disease, repeat percutaneous ablation of recurrent tumor in the liver is not a  possibility given the larger area of recurrent disease now present likely spanning over a maximal diameter of nearly 7 cm by unenhanced CT.  An option to consider for treatment would  be Y-90 radioembolization.  Her liver function is adequate and her performance status good enough for her to tolerate radioembolization.  The one problematic risk factor for radioembolization is the presence of underlying chronic kidney disease and renal insufficiency.  Her last labs did show some worsening of renal function with creatinine of 1.82 and estimated GFR of 29 mL/min.  Baseline creatinine has been closer to 1.4-1.6 in the past.  Given that radioembolization requires 2 separate staged arteriographic procedures, there would be some administration of iodinated contrast that would place her at risk of potential contrast induced kidney injury.  I told Mrs. Boniface to meet with Dr. Marin Olp on Friday to discuss whether any systemic treatment would be possible given the presence of lymph node disease.  I will check to see what her CEA level and renal function is when she gets her blood drawn at the [facility].  If it is determined that she is not a good candidate for systemic therapy, Y-90 treatment may be effective in controlling the recurrent metastatic disease in the liver.  Should her GFR still be around 30 mL/min, I think it would be helpful for Mrs. Birnbaum to see Nephrology.  This way, they can be involved with pre and post care should she end up receiving iodinated contrast material for arteriography and radioembolization in the future.   Electronically SignedAletta Edouard T 01/02/2018, 2:34 PM     I spent a total of 15 Minutes in face to face in clinical consultation, greater than 50% of which was counseling/coordinating care for metastatic colon carcinoma.

## 2018-01-03 ENCOUNTER — Other Ambulatory Visit: Payer: Medicare Other

## 2018-01-05 ENCOUNTER — Inpatient Hospital Stay: Payer: Medicare Other

## 2018-01-05 ENCOUNTER — Inpatient Hospital Stay: Payer: Medicare Other | Attending: Hematology & Oncology | Admitting: Hematology & Oncology

## 2018-01-05 ENCOUNTER — Other Ambulatory Visit: Payer: Self-pay

## 2018-01-05 ENCOUNTER — Encounter: Payer: Self-pay | Admitting: Hematology & Oncology

## 2018-01-05 VITALS — BP 164/68 | HR 61 | Temp 97.9°F | Resp 16 | Wt 198.0 lb

## 2018-01-05 DIAGNOSIS — C186 Malignant neoplasm of descending colon: Secondary | ICD-10-CM | POA: Diagnosis present

## 2018-01-05 DIAGNOSIS — M7989 Other specified soft tissue disorders: Secondary | ICD-10-CM | POA: Diagnosis not present

## 2018-01-05 DIAGNOSIS — Z79899 Other long term (current) drug therapy: Secondary | ICD-10-CM | POA: Diagnosis not present

## 2018-01-05 DIAGNOSIS — Z95828 Presence of other vascular implants and grafts: Secondary | ICD-10-CM

## 2018-01-05 DIAGNOSIS — E032 Hypothyroidism due to medicaments and other exogenous substances: Secondary | ICD-10-CM

## 2018-01-05 DIAGNOSIS — C787 Secondary malignant neoplasm of liver and intrahepatic bile duct: Secondary | ICD-10-CM

## 2018-01-05 LAB — CMP (CANCER CENTER ONLY)
ALT: 17 U/L (ref 10–47)
AST: 35 U/L (ref 11–38)
Albumin: 3.4 g/dL — ABNORMAL LOW (ref 3.5–5.0)
Alkaline Phosphatase: 208 U/L — ABNORMAL HIGH (ref 26–84)
Anion gap: 8 (ref 5–15)
BUN: 33 mg/dL — ABNORMAL HIGH (ref 7–22)
CO2: 24 mmol/L (ref 18–33)
Calcium: 10.9 mg/dL — ABNORMAL HIGH (ref 8.0–10.3)
Chloride: 108 mmol/L (ref 98–108)
Creatinine: 1.8 mg/dL — ABNORMAL HIGH (ref 0.60–1.20)
Glucose, Bld: 102 mg/dL (ref 73–118)
Potassium: 4.5 mmol/L (ref 3.3–4.7)
Sodium: 140 mmol/L (ref 128–145)
Total Bilirubin: 0.5 mg/dL (ref 0.2–1.6)
Total Protein: 8.4 g/dL — ABNORMAL HIGH (ref 6.4–8.1)

## 2018-01-05 LAB — CBC WITH DIFFERENTIAL (CANCER CENTER ONLY)
Basophils Absolute: 0 10*3/uL (ref 0.0–0.1)
Basophils Relative: 0 %
Eosinophils Absolute: 0.5 10*3/uL (ref 0.0–0.5)
Eosinophils Relative: 6 %
HCT: 34.4 % — ABNORMAL LOW (ref 34.8–46.6)
Hemoglobin: 11.5 g/dL — ABNORMAL LOW (ref 11.6–15.9)
Lymphocytes Relative: 16 %
Lymphs Abs: 1.5 10*3/uL (ref 0.9–3.3)
MCH: 30.1 pg (ref 26.0–34.0)
MCHC: 33.4 g/dL (ref 32.0–36.0)
MCV: 90.1 fL (ref 81.0–101.0)
Monocytes Absolute: 1.4 10*3/uL — ABNORMAL HIGH (ref 0.1–0.9)
Monocytes Relative: 14 %
Neutro Abs: 6 10*3/uL (ref 1.5–6.5)
Neutrophils Relative %: 64 %
Platelet Count: 353 10*3/uL (ref 145–400)
RBC: 3.82 MIL/uL (ref 3.70–5.32)
RDW: 15.3 % (ref 11.1–15.7)
WBC Count: 9.5 10*3/uL (ref 3.9–10.0)

## 2018-01-05 LAB — CEA (IN HOUSE-CHCC): CEA (CHCC-In House): 29.73 ng/mL — ABNORMAL HIGH (ref 0.00–5.00)

## 2018-01-05 LAB — FERRITIN: Ferritin: 239 ng/mL (ref 11–307)

## 2018-01-05 LAB — IRON AND TIBC
Iron: 53 ug/dL (ref 41–142)
Saturation Ratios: 19 % — ABNORMAL LOW (ref 21–57)
TIBC: 279 ug/dL (ref 236–444)
UIBC: 226 ug/dL

## 2018-01-05 LAB — TSH: TSH: 7.155 u[IU]/mL — ABNORMAL HIGH (ref 0.308–3.960)

## 2018-01-05 MED ORDER — SODIUM CHLORIDE 0.9% FLUSH
10.0000 mL | INTRAVENOUS | Status: DC | PRN
  Administered 2018-01-05: 10 mL via INTRAVENOUS
  Filled 2018-01-05: qty 10

## 2018-01-05 MED ORDER — HEPARIN SOD (PORK) LOCK FLUSH 100 UNIT/ML IV SOLN
500.0000 [IU] | Freq: Once | INTRAVENOUS | Status: AC
  Administered 2018-01-05: 500 [IU] via INTRAVENOUS
  Filled 2018-01-05: qty 5

## 2018-01-05 NOTE — Progress Notes (Signed)
Hematology and Oncology Follow Up Visit  ALOHILANI LEVENHAGEN [id number] Mar 02, 1935 83 y.o. 01/05/2018   Principle Diagnosis:  Metastatic colon cancer -- liver metst  Past Therapy:   FOLFOX q 14 days s/p cycle 3 - DC'd secondary to non-tolerance RFA surgery - January 2019  Current Therapy: Observation   Interim History:  Ms. Reitano is here today with her sister for follow-up.  Unfortunately, it is obvious that she has progressive disease.  She had a PET scan done on 01/01/2018.  The PET scan showed increased activity in the liver lesion.  Liver lesion measures 7 x 5.9 cm.  The SUV is 13.8.  She has some hypermetabolic porta hepatis node.  She has a hypermetabolic right paratracheal lymph node.  Her CEA has gone up to 30.  She saw Dr. Kathlene Cote of interventional radiology.  He does not think that this tumor could be treated by another radiofrequency ablation.  However, it is possible that this might be treated by intrahepatic therapy.  She overall still looks great.  She feels good.  There is no pain.  She is had a good appetite.  She is had no nausea or vomiting.  There is been no bleeding.  There is been no cough.  She has a little bit of swelling in her ankles.  Overall, her performance status is ECOG 1.   Medications:  Allergies as of 01/05/2018   No Known Allergies     Medication List        Accurate as of 01/05/18 12:46 PM. Always use your most recent med list.          amlodipine-atorvastatin 10-10 MG tablet Commonly known as:  CADUET Take 1 tablet by mouth daily.   CALTRATE 600+D PO Take 1 tablet by mouth at bedtime.   ferrous sulfate 325 (65 FE) MG tablet Take 325 mg by mouth daily with breakfast.   levothyroxine 50 MCG tablet Commonly known as:  SYNTHROID, LEVOTHROID Take 1 tablet (50 mcg total) by mouth daily before breakfast.   lidocaine-prilocaine cream Commonly known as:  EMLA Apply 1 application topically as needed.   metoprolol tartrate 25 MG  tablet Commonly known as:  LOPRESSOR Take 50 mg by mouth 2 (two) times daily.   multivitamin with minerals Tabs tablet Take 1 tablet by mouth daily.   potassium chloride SA 20 MEQ tablet Commonly known as:  K-DUR,KLOR-CON Take 1 tablet (20 mEq total) by mouth daily.   VITAMIN B-12 PO Take 1 tablet by mouth daily.       Allergies: No Known Allergies  Past Medical History, Surgical history, Social history, and Family History were reviewed and updated.  Review of Systems: Review of Systems  Constitutional: Negative.   HENT: Negative.   Eyes: Negative.   Respiratory: Negative.   Cardiovascular: Negative.   Gastrointestinal: Negative.   Genitourinary: Negative.   Musculoskeletal: Negative.   Skin: Negative.   Neurological: Negative.   Endo/Heme/Allergies: Negative.   Psychiatric/Behavioral: Negative.    Marland Kitchen   Physical Exam:  weight is 198 lb (89.8 kg). Her oral temperature is 97.9 F (36.6 C). Her blood pressure is 164/68 (abnormal) and her pulse is 61. Her respiration is 16 and oxygen saturation is 98%.   Wt Readings from Last 3 Encounters:  01/05/18 198 lb (89.8 kg)  01/02/18 191 lb (86.6 kg)  11/08/17 191 lb (86.6 kg)    Physical Exam  Constitutional: She is oriented to person, place, and time.  HENT:  Head: Normocephalic and atraumatic.  Mouth/Throat: Oropharynx is clear and moist.  Eyes: Pupils are equal, round, and reactive to light. EOM are normal.  Neck: Normal range of motion.  Cardiovascular: Normal rate, regular rhythm and normal heart sounds.  Pulmonary/Chest: Effort normal and breath sounds normal.  Abdominal: Soft. Bowel sounds are normal.  Musculoskeletal: Normal range of motion. She exhibits no edema, tenderness or deformity.  Lymphadenopathy:    She has no cervical adenopathy.  Neurological: She is alert and oriented to person, place, and time.  Skin: Skin is warm and dry. No rash noted. No erythema.  Psychiatric: She has a normal mood and  affect. Her behavior is normal. Judgment and thought content normal.  Vitals reviewed.    Lab Results  Component Value Date   WBC 9.5 01/05/2018   HGB 11.5 (L) 01/05/2018   HCT 34.4 (L) 01/05/2018   MCV 90.1 01/05/2018   PLT 353 01/05/2018   Lab Results  Component Value Date   FERRITIN 239 01/05/2018   IRON 53 01/05/2018   TIBC 279 01/05/2018   UIBC 226 01/05/2018   IRONPCTSAT 19 (L) 01/05/2018   Lab Results  Component Value Date   RBC 3.82 01/05/2018   No results found for: KPAFRELGTCHN, LAMBDASER, KAPLAMBRATIO No results found for: IGGSERUM, IGA, IGMSERUM No results found for: Kathrynn Ducking, MSPIKE, SPEI   Chemistry      Component Value Date/Time   NA 140 01/05/2018 0930   NA 144 04/26/2017 0830   NA 137 09/30/2016 0940   K 4.5 01/05/2018 0930   K 4.1 04/26/2017 0830   K 4.7 09/30/2016 0940   CL 108 01/05/2018 0930   CL 108 04/26/2017 0830   CO2 24 01/05/2018 0930   CO2 27 04/26/2017 0830   CO2 18 (L) 09/30/2016 0940   BUN 33 (H) 01/05/2018 0930   BUN 25 (H) 04/26/2017 0830   BUN 21.5 09/30/2016 0940   CREATININE 1.80 (H) 01/05/2018 0930   CREATININE 1.5 (H) 04/26/2017 0830   CREATININE 1.8 (H) 09/30/2016 0940      Component Value Date/Time   CALCIUM 10.9 (H) 01/05/2018 0930   CALCIUM 9.9 04/26/2017 0830   CALCIUM 10.4 09/30/2016 0940   ALKPHOS 208 (H) 01/05/2018 0930   ALKPHOS 124 (H) 04/26/2017 0830   ALKPHOS 147 09/30/2016 0940   AST 35 01/05/2018 0930   AST 27 09/30/2016 0940   ALT 17 01/05/2018 0930   ALT 16 04/26/2017 0830   ALT 11 09/30/2016 0940   BILITOT 0.5 01/05/2018 0930   BILITOT 0.34 09/30/2016 0940      Impression and Plan: Ms. Weyandt is a very pleasant 83 yo African American female with stage IIIB (T3N1M0) adenocarcinoma of the descending colon.  We tried her on systemic chemotherapy in the adjuvant setting and she tolerated this incredibly poorly.  She only had 3 cycles of FOLFOX.  She  then recurred quickly.  We biopsied a liver lesion.  This was positive for adenocarcinoma.  She then underwent radio frequency ablation.  This was done in January 2019.  For right now, I think that we are going to have to get a biopsy of the liver lesion.  I think this will really help [place] out to see if we might be able to utilize immunotherapy if her tumor is MSI high.  We can also test the biopsy for HER2 status.  I really would like to avoid chemotherapy on her.  If we cannot use targeted therapy or immunotherapy, and I  think intrahepatic therapy would certainly be reasonable.  I spent about 30 minutes with she and her sister.  I reviewed the PET scan with them.  I went over her lab work.  I spent all the time face-to-face.  I helped counseled them and help coordinate care with respect to a liver biopsy.  Once I have the results back for the liver biopsy and the genetic markers, then we will get her back in for follow-up.     [name], MD 8/9/201912:46 PM

## 2018-01-05 NOTE — Patient Instructions (Signed)
Implanted Port Insertion, Care After °This sheet gives you information about how to care for yourself after your procedure. Your health care provider may also give you more specific instructions. If you have problems or questions, contact your health care provider. °What can I expect after the procedure? °After your procedure, it is common to have: °· Discomfort at the port insertion site. °· Bruising on the skin over the port. This should improve over 3-4 days. ° °Follow these instructions at home: °Port care °· After your port is placed, you will get a manufacturer's information card. The card has information about your port. Keep this card with you at all times. °· Take care of the port as told by your health care provider. Ask your health care provider if you or a family member can get training for taking care of the port at home. A home health care nurse may also take care of the port. °· Make sure to remember what type of port you have. °Incision care °· Follow instructions from your health care provider about how to take care of your port insertion site. Make sure you: °? Wash your hands with soap and water before you change your bandage (dressing). If soap and water are not available, use hand sanitizer. °? Change your dressing as told by your health care provider. °? Leave stitches (sutures), skin glue, or adhesive strips in place. These skin closures may need to stay in place for 2 weeks or longer. If adhesive strip edges start to loosen and curl up, you may trim the loose edges. Do not remove adhesive strips completely unless your health care provider tells you to do that. °· Check your port insertion site every day for signs of infection. Check for: °? More redness, swelling, or pain. °? More fluid or blood. °? Warmth. °? Pus or a bad smell. °General instructions °· Do not take baths, swim, or use a hot tub until your health care provider approves. °· Do not lift anything that is heavier than 10 lb (4.5  kg) for a week, or as told by your health care provider. °· Ask your health care provider when it is okay to: °? Return to work or school. °? Resume usual physical activities or sports. °· Do not drive for 24 hours if you were given a medicine to help you relax (sedative). °· Take over-the-counter and prescription medicines only as told by your health care provider. °· Wear a medical alert bracelet in case of an emergency. This will tell any health care providers that you have a port. °· Keep all follow-up visits as told by your health care provider. This is important. °Contact a health care provider if: °· You cannot flush your port with saline as directed, or you cannot draw blood from the port. °· You have a fever or chills. °· You have more redness, swelling, or pain around your port insertion site. °· You have more fluid or blood coming from your port insertion site. °· Your port insertion site feels warm to the touch. °· You have pus or a bad smell coming from the port insertion site. °Get help right away if: °· You have chest pain or shortness of breath. °· You have bleeding from your port that you cannot control. °Summary °· Take care of the port as told by your health care provider. °· Change your dressing as told by your health care provider. °· Keep all follow-up visits as told by your health care provider. °  This information is not intended to replace advice given to you by your health care provider. Make sure you discuss any questions you have with your health care provider. °Document Released: 03/06/2013 Document Revised: 04/06/2016 Document Reviewed: 04/06/2016 °Elsevier Interactive Patient Education © 2017 Elsevier Inc. ° °

## 2018-01-11 ENCOUNTER — Other Ambulatory Visit: Payer: Self-pay | Admitting: Hematology & Oncology

## 2018-01-11 ENCOUNTER — Other Ambulatory Visit: Payer: Self-pay | Admitting: Physician Assistant

## 2018-01-12 ENCOUNTER — Ambulatory Visit (HOSPITAL_COMMUNITY)
Admission: RE | Admit: 2018-01-12 | Discharge: 2018-01-12 | Disposition: A | Discharge status: Home / Self Care | Payer: Medicare Other | Source: Ambulatory Visit | Attending: Hematology & Oncology | Admitting: Hematology & Oncology

## 2018-01-12 ENCOUNTER — Other Ambulatory Visit: Payer: Self-pay

## 2018-01-12 ENCOUNTER — Encounter (HOSPITAL_COMMUNITY): Payer: Self-pay

## 2018-01-12 DIAGNOSIS — C186 Malignant neoplasm of descending colon: Secondary | ICD-10-CM | POA: Insufficient documentation

## 2018-01-12 DIAGNOSIS — I10 Essential (primary) hypertension: Secondary | ICD-10-CM | POA: Insufficient documentation

## 2018-01-12 DIAGNOSIS — Z9889 Other specified postprocedural states: Secondary | ICD-10-CM | POA: Insufficient documentation

## 2018-01-12 DIAGNOSIS — Z79899 Other long term (current) drug therapy: Secondary | ICD-10-CM | POA: Diagnosis not present

## 2018-01-12 DIAGNOSIS — Z79890 Hormone replacement therapy (postmenopausal): Secondary | ICD-10-CM | POA: Diagnosis not present

## 2018-01-12 DIAGNOSIS — C787 Secondary malignant neoplasm of liver and intrahepatic bile duct: Secondary | ICD-10-CM | POA: Diagnosis not present

## 2018-01-12 DIAGNOSIS — Z800 Family history of malignant neoplasm of digestive organs: Secondary | ICD-10-CM | POA: Diagnosis not present

## 2018-01-12 DIAGNOSIS — Z87891 Personal history of nicotine dependence: Secondary | ICD-10-CM | POA: Diagnosis not present

## 2018-01-12 DIAGNOSIS — E039 Hypothyroidism, unspecified: Secondary | ICD-10-CM | POA: Diagnosis not present

## 2018-01-12 DIAGNOSIS — Z905 Acquired absence of kidney: Secondary | ICD-10-CM | POA: Diagnosis not present

## 2018-01-12 DIAGNOSIS — Z9081 Acquired absence of spleen: Secondary | ICD-10-CM | POA: Diagnosis not present

## 2018-01-12 LAB — CBC
HCT: 34 % — ABNORMAL LOW (ref 36.0–46.0)
Hemoglobin: 11.4 g/dL — ABNORMAL LOW (ref 12.0–15.0)
MCH: 30 pg (ref 26.0–34.0)
MCHC: 33.5 g/dL (ref 30.0–36.0)
MCV: 89.5 fL (ref 78.0–100.0)
Platelets: 377 10*3/uL (ref 150–400)
RBC: 3.8 MIL/uL — ABNORMAL LOW (ref 3.87–5.11)
RDW: 15.2 % (ref 11.5–15.5)
WBC: 10 10*3/uL (ref 4.0–10.5)

## 2018-01-12 LAB — PROTIME-INR
INR: 1.11
Prothrombin Time: 14.2 seconds (ref 11.4–15.2)

## 2018-01-12 LAB — APTT: aPTT: 30 seconds (ref 24–36)

## 2018-01-12 MED ORDER — LIDOCAINE HCL 1 % IJ SOLN
INTRAMUSCULAR | Status: AC
  Filled 2018-01-12: qty 10

## 2018-01-12 MED ORDER — FENTANYL CITRATE (PF) 100 MCG/2ML IJ SOLN
INTRAMUSCULAR | Status: AC
  Filled 2018-01-12: qty 2

## 2018-01-12 MED ORDER — SODIUM CHLORIDE 0.9 % IV SOLN
INTRAVENOUS | Status: DC
  Administered 2018-01-12: 11:00:00 via INTRAVENOUS

## 2018-01-12 MED ORDER — FENTANYL CITRATE (PF) 100 MCG/2ML IJ SOLN
INTRAMUSCULAR | Status: AC | PRN
  Administered 2018-01-12: 50 ug via INTRAVENOUS

## 2018-01-12 MED ORDER — GELATIN ABSORBABLE 12-7 MM EX MISC
CUTANEOUS | Status: AC
  Filled 2018-01-12: qty 1

## 2018-01-12 MED ORDER — MIDAZOLAM HCL 2 MG/2ML IJ SOLN
INTRAMUSCULAR | Status: AC | PRN
  Administered 2018-01-12: 1 mg via INTRAVENOUS

## 2018-01-12 MED ORDER — HYDROCODONE-ACETAMINOPHEN 5-325 MG PO TABS: 1.0000 | ORAL_TABLET | ORAL | Status: DC | PRN

## 2018-01-12 MED ORDER — MIDAZOLAM HCL 2 MG/2ML IJ SOLN
INTRAMUSCULAR | Status: AC
  Filled 2018-01-12: qty 2

## 2018-01-12 NOTE — Procedures (Signed)
Interventional Radiology Procedure Note  Procedure: US guided biopsy of liver  Complications: None  Estimated Blood Loss: < 10 mL  Findings: Right lobe liver mass sampled via 17 G needle with 18 G core biopsy x 4.  Venetia Night. Kathlene Cote, M.D Pager:  [phone]

## 2018-01-12 NOTE — Discharge Instructions (Signed)
Liver Biopsy, Care After °These instructions give you information on caring for yourself after your procedure. Your doctor may also give you more specific instructions. Call your doctor if you have any problems or questions after your procedure. °Follow these instructions at home: °· Rest at home for 1-2 days or as told by your doctor. °· Have someone stay with you for at least 24 hours. °· Do not do these things in the first 24 hours: °? Drive. °? Use machinery. °? Take care of other people. °? Sign legal documents. °? Take a bath or shower. °· There are many different ways to close and cover a cut (incision). For example, a cut can be closed with stitches, skin glue, or adhesive strips. Follow your doctor's instructions on: °? Taking care of your cut. °? Changing and removing your bandage (dressing). °? Removing whatever was used to close your cut. °· Do not drink alcohol in the first week. °· Do not lift more than 5 pounds or play contact sports for the first 2 weeks. °· Take medicines only as told by your doctor. For 1 week, do not take medicine that has aspirin in it or medicines like ibuprofen. °· Get your test results. °Contact a doctor if: °· A cut bleeds and leaves more than just a small spot of blood. °· A cut is red, puffs up (swells), or hurts more than before. °· Fluid or something else comes from a cut. °· A cut smells bad. °· You have a fever or chills. °Get help right away if: °· You have swelling, bloating, or pain in your belly (abdomen). °· You get dizzy or faint. °· You have a rash. °· You feel sick to your stomach (nauseous) or throw up (vomit). °· You have trouble breathing, feel short of breath, or feel faint. °· Your chest hurts. °· You have problems talking or seeing. °· You have trouble balancing or moving your arms or legs. °This information is not intended to replace advice given to you by your health care provider. Make sure you discuss any questions you have with your health care  provider. °Document Released: 02/23/2008 Document Revised: 10/22/2015 Document Reviewed: 07/12/2013 °Elsevier Interactive Patient Education © 2018 [employer]vier Inc[employer] ° ° ° ° °Moderate Conscious Sedation, Adult, Care After °These instructions provide you with information about caring for yourself after your procedure. Your health care provider may also give you more specific instructions. Your treatment has been planned according to current medical practices, but problems sometimes occur. Call your health care provider if you have any problems or questions after your procedure. °What can I expect after the procedure? °After your procedure, it is common: °· To feel sleepy for several hours. °· To feel clumsy and have poor balance for several hours. °· To have poor judgment for several hours. °· To vomit if you eat too soon. ° °Follow these instructions at home: °For at least 24 hours after the procedure: ° °· Do not: °? Participate in activities where you could fall or become injured. °? Drive. °? Use heavy machinery. °? Drink alcohol. °? Take sleeping pills or medicines that cause drowsiness. °? Make important decisions or sign legal documents. °? Take care of children on your own. °· Rest. °Eating and drinking °· Follow the diet recommended by your health care provider. °· If you vomit: °? Drink water, juice, or soup when you can drink without vomiting. °? Make sure you have little or no nausea before eating solid foods. °General instructions °·   Have a responsible adult stay with you until you are awake and alert. °· Take over-the-counter and prescription medicines only as told by your health care provider. °· If you smoke, do not smoke without supervision. °· Keep all follow-up visits as told by your health care provider. This is important. °Contact a health care provider if: °· You keep feeling nauseous or you keep vomiting. °· You feel light-headed. °· You develop a rash. °· You have a fever. °Get help right away  if: °· You have trouble breathing. °This information is not intended to replace advice given to you by your health care provider. Make sure you discuss any questions you have with your health care provider. °Document Released: 03/06/2013 Document Revised: 10/19/2015 Document Reviewed: 09/05/2015 °Elsevier Interactive Patient Education © 2018 Elsevier Inc. ° ° °

## 2018-01-12 NOTE — H&P (Signed)
Chief Complaint: Patient was seen in consultation today for image guided liver biopsy  Referring Physician(s): Ennever,Peter R  Supervising Physician: Aletta Edouard  Patient Status: [facility] - Out-pt  History of Present Illness: Melanie [name] is a 83 y.o. female with a past medical history significant for HTN, hypothyroidism, goiter and metastatic colon cancer. Patient known to IR service with previous microwave thermal ablation of right lobe hepatic metastasis from colon performed on 06/14/17 by Dr. Kathlene Cote. Most recent IR follow up on 8/6 - please see complete progress note for further details. She is also followed by Dr. Marin Olp who requests a biopsy of the previously treated  liver lesion that has enlarged on PET scan from 8/5.   Patient denies any complaints today. She states she ate two crackers and had a few sips of water with her medications this morning at 630 am; she denies any other PO intake besides this. She does not take any blood thinners.   Past Medical History:  Diagnosis Date  . Arthritis   . Chronic thyroiditis   . Colon cancer ([place])    colon  . Colostomy in place [facility])    history of, no longer in place  . Essential hypertension   . Goals of care, counseling/discussion 09/30/2016  . Goiter   . Hypothyroidism     Past Surgical History:  Procedure Laterality Date  . COLON SURGERY  07/2016   colostomy, colorectal cancer, s/p chemotherapy  . COLONOSCOPY    . COLOSTOMY REVERSAL    . IR RADIOLOGIST EVAL & MGMT  04/27/2017  . IR RADIOLOGIST EVAL & MGMT  07/13/2017  . IR RADIOLOGIST EVAL & MGMT  11/08/2017  . IR RADIOLOGIST EVAL & MGMT  01/02/2018  . NEPHRECTOMY  07/2016  . PORTA CATH INSERTION    . RADIOFREQUENCY ABLATION N/A 06/14/2017   Procedure: CT MICROWAVE THERMAL ABLATION;  Surgeon: Aletta Edouard, MD;  Location: [facility] ORS;  Service: Anesthesiology;  Laterality: N/A;  . SPLENECTOMY  07/2016    Allergies: Patient has no known allergies.  Medications: Prior  to Admission medications   Medication Sig Start Date End Date Taking? Authorizing Provider  amlodipine-atorvastatin (CADUET) 10-10 MG tablet Take 1 tablet by mouth daily.   Yes [provider]  Calcium Carbonate-Vitamin D (CALTRATE 600+D PO) Take 1 tablet by mouth at bedtime.   Yes [provider]  Cyanocobalamin (VITAMIN B-12 PO) Take 1 tablet by mouth daily.   Yes [provider]  ferrous sulfate 325 (65 FE) MG tablet Take 325 mg by mouth daily with breakfast.   Yes [provider]  levothyroxine (SYNTHROID, LEVOTHROID) 50 MCG tablet Take 1 tablet (50 mcg total) by mouth daily before breakfast. 11/03/17  Yes Ennever, Rudell Cobb, MD  metoprolol tartrate (LOPRESSOR) 25 MG tablet Take 50 mg by mouth 2 (two) times daily.    Yes [provider]  Multiple Vitamin (MULTIVITAMIN WITH MINERALS) TABS tablet Take 1 tablet by mouth daily.   Yes [provider]  potassium chloride SA (K-DUR,KLOR-CON) 20 MEQ tablet Take 1 tablet (20 mEq total) by mouth daily. 02/01/17  Yes [name], MD  lidocaine-prilocaine (EMLA) cream APPLY 1 APPLICATION TOPICALLY AS NEEDED. 01/11/18   [name], MD     Family History  Problem Relation Age of Onset  . Colon cancer Mother 68       Died last year at 78 years old  . Colon cancer Brother 47    Social History   Socioeconomic History  .  Marital status: Widowed    Spouse name: Not on file  . Number of children: 0  . Years of education: 30  . Highest education level: Not on file  Occupational History  . 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 Financial resource strain: Not on file  . Food insecurity:    Worry: Not on file    Inability: Not on file  . Transportation needs:    Medical: Not on file    Non-medical: Not on file  Tobacco Use  . Smoking status: Former Smoker    Packs/day: 1.00    Years: 35.00    Pack years: 35.00    Last attempt to quit: 1993    Years since quitting: 26.6    . Smokeless tobacco: Never Used  Substance and Sexual Activity  . Alcohol use: No  . Drug use: No  . Sexual activity: Not on file  Lifestyle  . Physical activity:    Days per week: Not on file    Minutes per session: Not on file  . Stress: Not on file  Relationships  . Social connections:    Talks on phone: Not on file    Gets together: Not on file    Attends religious service: Not on file    Active member of club or organization: Not on file    Attends meetings of clubs or organizations: Not on file    Relationship status: Not on file  Other Topics Concern  . Not on file  Social History Narrative   Lives alone   Retired   Caffeine- 1 cup daily   No children   High school education     Review of Systems: A 12 point ROS discussed and pertinent positives are indicated in the HPI above.  All other systems are negative.  Review of Systems  Constitutional: Negative for chills and fever.  Respiratory: Negative for cough and shortness of breath.   Cardiovascular: Negative for chest pain.  Gastrointestinal: Negative for abdominal pain, diarrhea, nausea and vomiting.  Skin: Negative for rash.  Neurological: Negative for syncope.    Vital Signs: BP (!) 177/79   Pulse 67   Temp 98.6 F (37 C) (Oral)   Resp 16   SpO2 96%   Physical Exam  Constitutional: She is oriented to person, place, and time. No distress.  HENT:  Head: Normocephalic.  Cardiovascular: Normal rate, regular rhythm and normal heart sounds.  Pulmonary/Chest: Effort normal and breath sounds normal.  Right sided port-a-cath  Abdominal: Soft. Bowel sounds are normal. There is no tenderness.  Neurological: She is alert and oriented to person, place, and time.  Skin: Skin is warm and dry. No rash noted. She is not diaphoretic.  Psychiatric: She has a normal mood and affect. Her behavior is normal. Judgment and thought content normal.  Nursing note and vitals reviewed.    MD Evaluation Airway:  WNL Heart: WNL Abdomen: WNL Chest/ Lungs: WNL ASA  Classification: 3 Mallampati/Airway Score: One   Imaging: Nm Pet Image Restag (ps) Skull Base To Thigh  Result Date: 01/01/2018 CLINICAL DATA:  Subsequent treatment strategy for colon cancer. EXAM: NUCLEAR MEDICINE PET SKULL BASE TO THIGH TECHNIQUE: 9.39 mCi F-18 FDG was injected intravenously. Full-ring PET imaging was performed from the skull base to thigh after the radiotracer. CT data was obtained and used for attenuation correction and anatomic localization. Fasting blood glucose: 94 mg/dl COMPARISON:  10/26/2017 FINDINGS: Mediastinal blood pool activity: SUV max 2.95 NECK: Enlarged thyroid  gland is again identified containing diffuse radiotracer uptake. The appearance is similar to previous exam within SUV max of 12.16. Incidental CT findings: none CHEST: The index right paratracheal lymph node measures 1 cm and has an SUV max of 5.47. Previously this measured 1 cm within SUV max of 3.0. No pleural effusion identified. Diminished lung volumes are identified bilaterally. Bilateral hazy ground-glass opacities with interstitial accentuation is again noted. Stable right middle lobe pulmonary nodule is too small to reliably characterize measuring 6 mm, image 34/8. No new pulmonary nodules identified. Incidental CT findings: Aortic atherosclerosis. Three vessel coronary artery atherosclerotic calcifications identified. ABDOMEN/PELVIS: Previously ablated tumor measures 7.0 x 5.9 cm with an SUV max of 13.8. On the previous exam this measured 5.8 x 5.9 cm with an SUV max of 13.9. Hypermetabolic porta hepatis node measures 2.1 cm and has an SUV max of 4.97. The previously this measured 2.1 cm and had an SUV max of 4.75. Incidental CT findings: Previous splenectomy. Aortic atherosclerosis noted. Atrophic left kidney is again noted. SKELETON: No focal hypermetabolic activity to suggest skeletal metastasis. Incidental CT findings: none IMPRESSION: 1. Previously  treated tumor within right lobe of liver has increased in size in the interval. Similar degree of hypermetabolism with an SUV max of 13.8. 2. No significant change in size or FDG uptake associated with hypermetabolic porta hepatis adenopathy. 3. Mildly increased FDG uptake associated with morphologically stable right paratracheal lymph node 4.  Aortic Atherosclerosis (ICD10-I70.0). 5. Diffuse uptake throughout the thyroid gland which appears enlarged. Correlation with patient's thyroid function advised. Electronically Signed   By: Kerby Moors M.D.   On: 01/01/2018 11:06   Ir Radiologist Eval & Mgmt  Result Date: 01/02/2018 Please refer to notes tab for details about interventional procedure. (Op Note)   Labs:  CBC: Recent Labs    08/04/17 0831 11/03/17 0905 01/05/18 0930 01/12/18 1118  WBC 6.0 8.2 9.5 10.0  HGB 12.1 11.8 11.5* 11.4*  HCT 35.7 35.6 34.4* 34.0*  PLT 308 316 353 377    COAGS: Recent Labs    06/12/17 1140 01/12/18 1118  INR 1.09 1.11  APTT  --  30    BMP: Recent Labs    06/12/17 1140 06/15/17 0403 08/04/17 0831 11/03/17 0905 01/05/18 0930  NA 138 138 139 137 140  K 3.9 4.2 3.9 4.3 4.5  CL 108 108 104 105 108  CO2 24 26 27 23 24   GLUCOSE 90 122* 96 90 102  BUN 29* 28* 31* 38* 33*  CALCIUM 9.9 9.1 10.7* 10.9* 10.9*  CREATININE 1.31* 1.42* 1.50* 1.82* 1.80*  GFRNONAA 37* 34*  --  25*  --   GFRAA 43* 39*  --  29*  --     LIVER FUNCTION TESTS: Recent Labs    06/15/17 0403 08/04/17 0831 11/03/17 0905 01/05/18 0930  BILITOT 0.3 0.6 0.3 0.5  AST 188* 61* 36* 35  ALT 34 28 9 17   ALKPHOS 141* 367* 204* 208*  PROT 7.0 8.3* 8.6* 8.4*  ALBUMIN 3.4* 3.5 3.7 3.4*    TUMOR MARKERS: No results for input(s): AFPTM, CEA, CA199, CHROMGRNA in the last 8760 hours.  Assessment and Plan:  Request for image guided biopsy of enlarging liver lesion which was previously treated with microwave ablation by Dr. Kathlene Cote. PET scan from 8/5 reveals the previously  treated area has enlarged and per consult note from 8/6 it does not appear to be amenable to repeat microwave ablation given the larger area of recurrent disease, however  Y-90 radioembolization could be a possibility. Unfortunately, she was unable to tolerate systemic therapy previously. Per Dr. Antonieta Pert note from 8/9 plan is to obtain biopsy of the liver lesion to determine if immunotherapy may be an option for treatment.   WBC 10.0, INR 1.11 today. CEA 29.73 (8/9). Patient did eat two crackers at 0630 although it has been >6 hours since then without any PO intake. Will proceed with biopsy of liver lesion today.  Risks and benefits discussed with the patient and sister including, but not limited to bleeding, infection, damage to adjacent structures or low yield requiring additional tests.  All of the patient and sister's questions were answered, patient is agreeable to proceed. Consent signed and in chart.   Thank you for this interesting consult.  I greatly enjoyed meeting Melanie [name] and look forward to participating in their care.  A copy of this report was sent to the requesting provider on this date.  Electronically Signed: Joaquim Nam, PA-C 01/12/2018, 12:44 PM   I spent a total of15 Minutes in face to face in clinical consultation, greater than 50% of which was counseling/coordinating care for image guided liver lesion biopsy.

## 2018-01-30 ENCOUNTER — Encounter (HOSPITAL_COMMUNITY): Payer: Self-pay | Admitting: Hematology & Oncology

## 2018-02-01 ENCOUNTER — Other Ambulatory Visit: Payer: Self-pay | Admitting: Hematology & Oncology

## 2018-02-01 DIAGNOSIS — C186 Malignant neoplasm of descending colon: Secondary | ICD-10-CM

## 2018-02-14 ENCOUNTER — Encounter: Payer: Self-pay | Admitting: Hematology & Oncology

## 2018-02-14 ENCOUNTER — Inpatient Hospital Stay: Payer: Medicare Other | Attending: Hematology & Oncology | Admitting: Hematology & Oncology

## 2018-02-14 ENCOUNTER — Other Ambulatory Visit: Payer: Self-pay

## 2018-02-14 ENCOUNTER — Inpatient Hospital Stay: Payer: Medicare Other

## 2018-02-14 VITALS — BP 141/73 | HR 59 | Temp 98.2°F | Resp 19 | Wt 204.8 lb

## 2018-02-14 DIAGNOSIS — C186 Malignant neoplasm of descending colon: Secondary | ICD-10-CM | POA: Diagnosis present

## 2018-02-14 DIAGNOSIS — Z79899 Other long term (current) drug therapy: Secondary | ICD-10-CM | POA: Insufficient documentation

## 2018-02-14 DIAGNOSIS — C189 Malignant neoplasm of colon, unspecified: Secondary | ICD-10-CM

## 2018-02-14 DIAGNOSIS — C787 Secondary malignant neoplasm of liver and intrahepatic bile duct: Secondary | ICD-10-CM | POA: Insufficient documentation

## 2018-02-14 DIAGNOSIS — Z7982 Long term (current) use of aspirin: Secondary | ICD-10-CM | POA: Insufficient documentation

## 2018-02-14 LAB — CBC WITH DIFFERENTIAL (CANCER CENTER ONLY)
Basophils Absolute: 0.1 10*3/uL (ref 0.0–0.1)
Basophils Relative: 1 %
Eosinophils Absolute: 0.6 10*3/uL — ABNORMAL HIGH (ref 0.0–0.5)
Eosinophils Relative: 7 %
HCT: 34 % — ABNORMAL LOW (ref 34.8–46.6)
Hemoglobin: 11.3 g/dL — ABNORMAL LOW (ref 11.6–15.9)
Lymphocytes Relative: 16 %
Lymphs Abs: 1.5 10*3/uL (ref 0.9–3.3)
MCH: 30 pg (ref 26.0–34.0)
MCHC: 33.2 g/dL (ref 32.0–36.0)
MCV: 90.2 fL (ref 81.0–101.0)
Monocytes Absolute: 1.4 10*3/uL — ABNORMAL HIGH (ref 0.1–0.9)
Monocytes Relative: 15 %
Neutro Abs: 5.8 10*3/uL (ref 1.5–6.5)
Neutrophils Relative %: 61 %
Platelet Count: 345 10*3/uL (ref 145–400)
RBC: 3.77 MIL/uL (ref 3.70–5.32)
RDW: 15.2 % (ref 11.1–15.7)
WBC Count: 9.3 10*3/uL (ref 3.9–10.0)

## 2018-02-14 LAB — CMP (CANCER CENTER ONLY)
ALT: 17 U/L (ref 10–47)
AST: 32 U/L (ref 11–38)
Albumin: 3.4 g/dL — ABNORMAL LOW (ref 3.5–5.0)
Alkaline Phosphatase: 182 U/L — ABNORMAL HIGH (ref 26–84)
Anion gap: 3 — ABNORMAL LOW (ref 5–15)
BUN: 26 mg/dL — ABNORMAL HIGH (ref 7–22)
CO2: 26 mmol/L (ref 18–33)
Calcium: 10.2 mg/dL (ref 8.0–10.3)
Chloride: 109 mmol/L — ABNORMAL HIGH (ref 98–108)
Creatinine: 1.9 mg/dL — ABNORMAL HIGH (ref 0.60–1.20)
Glucose, Bld: 92 mg/dL (ref 73–118)
Potassium: 4.1 mmol/L (ref 3.3–4.7)
Sodium: 138 mmol/L (ref 128–145)
Total Bilirubin: 0.5 mg/dL (ref 0.2–1.6)
Total Protein: 8.2 g/dL — ABNORMAL HIGH (ref 6.4–8.1)

## 2018-02-14 NOTE — Progress Notes (Signed)
Hematology and Oncology Follow Up Visit  TIFFENY MINCHEW [id number] [birth year] 83 y.o. 02/14/2018   Principle Diagnosis:  Metastatic colon cancer -- liver mets --  NO actionable mutation/LOW TMB/ MSI stable/ KRAS (+)/HER2-  Past Therapy:   FOLFOX q 14 days s/p cycle 3 - DC'd secondary to non-tolerance RFA surgery - January 2019  Current Therapy: Observation   Interim History:  Ms. Gambino is here today with her sister for follow-up.  We went ahead and did another biopsy of her liver met.  This was done on 01/25/2018.  The pathology report ([id number]) showed metastatic adenocarcinoma consistent with her colon cancer.  We did do molecular profiling.  Unfortunately, there is absolutely nothing that we have that we can target.  She is K-ras positive.  She is HER-2-.  She has a low TMB.  Her tumor is MSI stable.  At this point, I really would like to avoid having her undergo systemic chemotherapy.  Since we are just looking at a liver lesion, I think that we should consider trying intrahepatic therapy for her.  Possibly, intrahepatic TACE or may be intrahepatic Y-90 would be a good idea.  I think that she would be a candidate.  She is eating well.  Her weight is going up which is nice to see.  She is having no abdominal pain.  She is having no cough.  There is no fever.  She is had no change in bowel or bladder habits.  She has had no leg swelling.  Overall, her performance status is ECOG 1.   Medications:  Allergies as of 02/14/2018   No Known Allergies     Medication List        Accurate as of 02/14/18  1:09 PM. Always use your most recent med list.          amlodipine-atorvastatin 10-10 MG tablet Commonly known as:  CADUET Take 1 tablet by mouth daily.   aspirin 81 MG chewable tablet Chew by mouth daily.   CALTRATE 600+D PO Take 1 tablet by mouth at bedtime.   ferrous sulfate 325 (65 FE) MG tablet Take 325 mg by mouth daily with breakfast.   levothyroxine 50 MCG  tablet Commonly known as:  SYNTHROID, LEVOTHROID Take 1 tablet (50 mcg total) by mouth daily before breakfast.   lidocaine-prilocaine cream Commonly known as:  EMLA APPLY 1 APPLICATION TOPICALLY AS NEEDED.   metoprolol tartrate 25 MG tablet Commonly known as:  LOPRESSOR Take 50 mg by mouth 2 (two) times daily.   multivitamin with minerals Tabs tablet Take 1 tablet by mouth daily.   potassium chloride SA 20 MEQ tablet Commonly known as:  K-DUR,KLOR-CON Take 1 tablet (20 mEq total) by mouth daily.   VITAMIN B-12 PO Take 1 tablet by mouth daily.       Allergies: No Known Allergies  Past Medical History, Surgical history, Social history, and Family History were reviewed and updated.  Review of Systems: Review of Systems  Constitutional: Negative.   HENT: Negative.   Eyes: Negative.   Respiratory: Negative.   Cardiovascular: Negative.   Gastrointestinal: Negative.   Genitourinary: Negative.   Musculoskeletal: Negative.   Skin: Negative.   Neurological: Negative.   Endo/Heme/Allergies: Negative.   Psychiatric/Behavioral: Negative.    Marland Kitchen   Physical Exam:  weight is 204 lb 12.8 oz (92.9 kg). Her oral temperature is 98.2 F (36.8 C). Her blood pressure is 141/73 (abnormal) and her pulse is 59 (abnormal). Her respiration is 19 and oxygen saturation  is 97%.   Wt Readings from Last 3 Encounters:  02/14/18 204 lb 12.8 oz (92.9 kg)  01/05/18 198 lb (89.8 kg)  01/02/18 191 lb (86.6 kg)    Physical Exam  Constitutional: She is oriented to person, place, and time.  HENT:  Head: Normocephalic and atraumatic.  Mouth/Throat: Oropharynx is clear and moist.  Eyes: Pupils are equal, round, and reactive to light. EOM are normal.  Neck: Normal range of motion.  Cardiovascular: Normal rate, regular rhythm and normal heart sounds.  Pulmonary/Chest: Effort normal and breath sounds normal.  Abdominal: Soft. Bowel sounds are normal.  Musculoskeletal: Normal range of motion. She  exhibits no edema, tenderness or deformity.  Lymphadenopathy:    She has no cervical adenopathy.  Neurological: She is alert and oriented to person, place, and time.  Skin: Skin is warm and dry. No rash noted. No erythema.  Psychiatric: She has a normal mood and affect. Her behavior is normal. Judgment and thought content normal.  Vitals reviewed.    Lab Results  Component Value Date   WBC 9.3 02/14/2018   HGB 11.3 (L) 02/14/2018   HCT 34.0 (L) 02/14/2018   MCV 90.2 02/14/2018   PLT 345 02/14/2018   Lab Results  Component Value Date   FERRITIN 239 01/05/2018   IRON 53 01/05/2018   TIBC 279 01/05/2018   UIBC 226 01/05/2018   IRONPCTSAT 19 (L) 01/05/2018   Lab Results  Component Value Date   RBC 3.77 02/14/2018   No results found for: KPAFRELGTCHN, LAMBDASER, KAPLAMBRATIO No results found for: IGGSERUM, IGA, IGMSERUM No results found for: Odetta Pink, SPEI   Chemistry      Component Value Date/Time   NA 138 02/14/2018 1158   NA 144 04/26/2017 0830   NA 137 09/30/2016 0940   K 4.1 02/14/2018 1158   K 4.1 04/26/2017 0830   K 4.7 09/30/2016 0940   CL 109 (H) 02/14/2018 1158   CL 108 04/26/2017 0830   CO2 26 02/14/2018 1158   CO2 27 04/26/2017 0830   CO2 18 (L) 09/30/2016 0940   BUN 26 (H) 02/14/2018 1158   BUN 25 (H) 04/26/2017 0830   BUN 21.5 09/30/2016 0940   CREATININE 1.90 (H) 02/14/2018 1158   CREATININE 1.5 (H) 04/26/2017 0830   CREATININE 1.8 (H) 09/30/2016 0940      Component Value Date/Time   CALCIUM 10.2 02/14/2018 1158   CALCIUM 9.9 04/26/2017 0830   CALCIUM 10.4 09/30/2016 0940   ALKPHOS 182 (H) 02/14/2018 1158   ALKPHOS 124 (H) 04/26/2017 0830   ALKPHOS 147 09/30/2016 0940   AST 32 02/14/2018 1158   AST 27 09/30/2016 0940   ALT 17 02/14/2018 1158   ALT 16 04/26/2017 0830   ALT 11 09/30/2016 0940   BILITOT 0.5 02/14/2018 1158   BILITOT 0.34 09/30/2016 0940      Impression and Plan: Ms.  Livengood is a very pleasant 83 yo African American female with stage IIIB (T3N1M0) adenocarcinoma of the descending colon.  We tried her on systemic chemotherapy in the adjuvant setting and she tolerated this incredibly poorly.  She only had 3 cycles of FOLFOX.  She then recurred quickly.  We biopsied a liver lesion.  This was positive for adenocarcinoma.  She then underwent radio frequency ablation.  This was done in January 2019.  Again, hopefully we can employ intrahepatic therapy for her.  I think this would really be a good way to treat her.  I talked to she and her sister about this.  They are definitely agreeable to this.  We will plan to get her back in early November.  By then, I would think that she would have had the treatment done.       [name], MD 9/18/20191:09 PM

## 2018-02-14 NOTE — Patient Instructions (Signed)
Implanted Port Home Guide An implanted port is a type of central line that is placed under the skin. Central lines are used to provide IV access when treatment or nutrition needs to be given through a person's veins. Implanted ports are used for long-term IV access. An implanted port may be placed because:  You need IV medicine that would be irritating to the small veins in your hands or arms.  You need long-term IV medicines, such as antibiotics.  You need IV nutrition for a long period.  You need frequent blood draws for lab tests.  You need dialysis.  Implanted ports are usually placed in the chest area, but they can also be placed in the upper arm, the abdomen, or the leg. An implanted port has two main parts:  Reservoir. The reservoir is round and will appear as a small, raised area under your skin. The reservoir is the part where a needle is inserted to give medicines or draw blood.  Catheter. The catheter is a thin, flexible tube that extends from the reservoir. The catheter is placed into a large vein. Medicine that is inserted into the reservoir goes into the catheter and then into the vein.  How will I care for my incision site? Do not get the incision site wet. Bathe or shower as directed by your health care provider. How is my port accessed? Special steps must be taken to access the port:  Before the port is accessed, a numbing cream can be placed on the skin. This helps numb the skin over the port site.  Your health care provider uses a sterile technique to access the port. ? Your health care provider must put on a mask and sterile gloves. ? The skin over your port is cleaned carefully with an antiseptic and allowed to dry. ? The port is gently pinched between sterile gloves, and a needle is inserted into the port.  Only "non-coring" port needles should be used to access the port. Once the port is accessed, a blood return should be checked. This helps ensure that the port  is in the vein and is not clogged.  If your port needs to remain accessed for a constant infusion, a clear (transparent) bandage will be placed over the needle site. The bandage and needle will need to be changed every week, or as directed by your health care provider.  Keep the bandage covering the needle clean and dry. Do not get it wet. Follow your health care provider's instructions on how to take a shower or bath while the port is accessed.  If your port does not need to stay accessed, no bandage is needed over the port.  What is flushing? Flushing helps keep the port from getting clogged. Follow your health care provider's instructions on how and when to flush the port. Ports are usually flushed with saline solution or a medicine called heparin. The need for flushing will depend on how the port is used.  If the port is used for intermittent medicines or blood draws, the port will need to be flushed: ? After medicines have been given. ? After blood has been drawn. ? As part of routine maintenance.  If a constant infusion is running, the port may not need to be flushed.  How long will my port stay implanted? The port can stay in for as long as your health care provider thinks it is needed. When it is time for the port to come out, surgery will be   done to remove it. The procedure is similar to the one performed when the port was put in. When should I seek immediate medical care? When you have an implanted port, you should seek immediate medical care if:  You notice a bad smell coming from the incision site.  You have swelling, redness, or drainage at the incision site.  You have more swelling or pain at the port site or the surrounding area.  You have a fever that is not controlled with medicine.  This information is not intended to replace advice given to you by your health care provider. Make sure you discuss any questions you have with your health care provider. Document  Released: 05/16/2005 Document Revised: 10/22/2015 Document Reviewed: 01/21/2013 Elsevier Interactive Patient Education  2017 [employer]  

## 2018-02-15 ENCOUNTER — Telehealth: Payer: Self-pay | Admitting: *Deleted

## 2018-02-15 LAB — CEA (IN HOUSE-CHCC): CEA (CHCC-In House): 23.37 ng/mL — ABNORMAL HIGH (ref 0.00–5.00)

## 2018-02-15 NOTE — Telephone Encounter (Addendum)
Patient's sister aware of results  ----- Message from [name], MD sent at 02/15/2018  9:27 AM EDT ----- Call - your tumor marker is actually better!!!!  This is nice!!  Film/video editor

## 2018-03-21 ENCOUNTER — Telehealth: Payer: Self-pay | Admitting: Hematology & Oncology

## 2018-03-21 NOTE — Telephone Encounter (Signed)
Faxed medical records labs to: [facility]: [phone] F: [phone]      COPY SCANNED

## 2018-04-04 ENCOUNTER — Ambulatory Visit: Payer: Medicare Other | Admitting: Hematology & Oncology

## 2018-04-04 ENCOUNTER — Other Ambulatory Visit: Payer: Medicare Other

## 2018-04-24 ENCOUNTER — Inpatient Hospital Stay: Payer: Medicare Other

## 2018-04-24 ENCOUNTER — Other Ambulatory Visit: Payer: Self-pay

## 2018-04-24 ENCOUNTER — Encounter: Payer: Self-pay | Admitting: Hematology & Oncology

## 2018-04-24 ENCOUNTER — Inpatient Hospital Stay: Payer: Medicare Other | Attending: Hematology & Oncology | Admitting: Hematology & Oncology

## 2018-04-24 VITALS — BP 155/79 | HR 66 | Temp 98.5°F | Resp 18 | Wt 208.5 lb

## 2018-04-24 DIAGNOSIS — Z79899 Other long term (current) drug therapy: Secondary | ICD-10-CM

## 2018-04-24 DIAGNOSIS — C186 Malignant neoplasm of descending colon: Secondary | ICD-10-CM | POA: Insufficient documentation

## 2018-04-24 DIAGNOSIS — C189 Malignant neoplasm of colon, unspecified: Secondary | ICD-10-CM

## 2018-04-24 DIAGNOSIS — Z7982 Long term (current) use of aspirin: Secondary | ICD-10-CM | POA: Diagnosis not present

## 2018-04-24 DIAGNOSIS — C787 Secondary malignant neoplasm of liver and intrahepatic bile duct: Secondary | ICD-10-CM | POA: Insufficient documentation

## 2018-04-24 DIAGNOSIS — D500 Iron deficiency anemia secondary to blood loss (chronic): Secondary | ICD-10-CM

## 2018-04-24 LAB — CMP (CANCER CENTER ONLY)
ALT: 12 U/L (ref 10–47)
AST: 32 U/L (ref 11–38)
Albumin: 3.4 g/dL — ABNORMAL LOW (ref 3.5–5.0)
Alkaline Phosphatase: 147 U/L — ABNORMAL HIGH (ref 26–84)
Anion gap: 5 (ref 5–15)
BUN: 26 mg/dL — ABNORMAL HIGH (ref 7–22)
CO2: 27 mmol/L (ref 18–33)
Calcium: 10.1 mg/dL (ref 8.0–10.3)
Chloride: 106 mmol/L (ref 98–108)
Creatinine: 1.7 mg/dL — ABNORMAL HIGH (ref 0.60–1.20)
Glucose, Bld: 96 mg/dL (ref 73–118)
Potassium: 4.2 mmol/L (ref 3.3–4.7)
Sodium: 138 mmol/L (ref 128–145)
Total Bilirubin: 0.6 mg/dL (ref 0.2–1.6)
Total Protein: 8 g/dL (ref 6.4–8.1)

## 2018-04-24 LAB — CBC WITH DIFFERENTIAL (CANCER CENTER ONLY)
Abs Immature Granulocytes: 0.07 10*3/uL (ref 0.00–0.07)
Basophils Absolute: 0.1 10*3/uL (ref 0.0–0.1)
Basophils Relative: 1 %
Eosinophils Absolute: 0.7 10*3/uL — ABNORMAL HIGH (ref 0.0–0.5)
Eosinophils Relative: 7 %
HCT: 35 % — ABNORMAL LOW (ref 36.0–46.0)
Hemoglobin: 11.3 g/dL — ABNORMAL LOW (ref 12.0–15.0)
Immature Granulocytes: 1 %
Lymphocytes Relative: 18 %
Lymphs Abs: 1.6 10*3/uL (ref 0.7–4.0)
MCH: 28.6 pg (ref 26.0–34.0)
MCHC: 32.3 g/dL (ref 30.0–36.0)
MCV: 88.6 fL (ref 80.0–100.0)
Monocytes Absolute: 1.2 10*3/uL — ABNORMAL HIGH (ref 0.1–1.0)
Monocytes Relative: 13 %
Neutro Abs: 5.5 10*3/uL (ref 1.7–7.7)
Neutrophils Relative %: 60 %
Platelet Count: 349 10*3/uL (ref 150–400)
RBC: 3.95 MIL/uL (ref 3.87–5.11)
RDW: 15.4 % (ref 11.5–15.5)
WBC Count: 9.3 10*3/uL (ref 4.0–10.5)
nRBC: 0 % (ref 0.0–0.2)

## 2018-04-24 LAB — LACTATE DEHYDROGENASE: LDH: 233 U/L — ABNORMAL HIGH (ref 98–192)

## 2018-04-24 MED ORDER — SODIUM CHLORIDE 0.9% FLUSH
10.0000 mL | INTRAVENOUS | Status: DC | PRN
  Administered 2018-04-24: 10 mL via INTRAVENOUS
  Filled 2018-04-24: qty 10

## 2018-04-24 MED ORDER — HEPARIN SOD (PORK) LOCK FLUSH 100 UNIT/ML IV SOLN
500.0000 [IU] | Freq: Once | INTRAVENOUS | Status: AC
  Administered 2018-04-24: 500 [IU] via INTRAVENOUS
  Filled 2018-04-24: qty 5

## 2018-04-24 NOTE — Progress Notes (Signed)
Hematology and Oncology Follow Up Visit  Melanie [name] [id number] Oct 21, 1934 83 y.o. 04/24/2018   Principle Diagnosis:  Metastatic colon cancer -- liver mets --  NO actionable mutation/LOW TMB/ MSI stable/ KRAS (+)/HER2-  Past Therapy:   FOLFOX q 14 days s/p cycle 3 - DC'd secondary to non-tolerance RFA surgery - January 2019  Current Therapy: Observation   Interim History:  Melanie [name] is here today with her sister for follow-up.  We went ahead and did another biopsy of her liver met.  This was done on 01/25/2018.  The pathology report ([id number]) showed metastatic adenocarcinoma consistent with her colon cancer.  We did do molecular profiling.  Unfortunately, there is absolutely nothing that we have that we can target.  She is K-ras positive.  She is HER-2-.  She has a low TMB.  Her tumor is MSI stable.  Thankfully, her last CEA was actually a little bit better.  It was down to 23.  She would need to be seen by interventional radiology to see if they might be able to consider either Y-90 therapy or possibly intrahepatic chemotherapy.  She feels well.  She looks great.  She and her sister will be having a big family gathering for Thanksgiving.  She has had no change in bowel or bladder habits.  She has had no issues with bleeding.  There has been no fever.  She has had no cough or shortness of breath.  Overall, I would say that her performance status is ECOG 1.   Medications:  Allergies as of 04/24/2018   No Known Allergies     Medication List        Accurate as of 04/24/18 11:45 AM. Always use your most recent med list.          amlodipine-atorvastatin 10-10 MG tablet Commonly known as:  CADUET Take 1 tablet by mouth daily.   aspirin 81 MG chewable tablet Chew by mouth daily.   CALTRATE 600+D PO Take 1 tablet by mouth at bedtime.   ferrous sulfate 325 (65 FE) MG tablet Take 325 mg by mouth daily with breakfast.   levothyroxine 50 MCG tablet Commonly known  as:  SYNTHROID, LEVOTHROID Take 1 tablet (50 mcg total) by mouth daily before breakfast.   lidocaine-prilocaine cream Commonly known as:  EMLA APPLY 1 APPLICATION TOPICALLY AS NEEDED.   metoprolol tartrate 25 MG tablet Commonly known as:  LOPRESSOR Take 50 mg by mouth 2 (two) times daily.   multivitamin with minerals Tabs tablet Take 1 tablet by mouth daily.   potassium chloride SA 20 MEQ tablet Commonly known as:  K-DUR,KLOR-CON Take 1 tablet (20 mEq total) by mouth daily.   VITAMIN B-12 PO Take 1 tablet by mouth daily.       Allergies: No Known Allergies  Past Medical History, Surgical history, Social history, and Family History were reviewed and updated.  Review of Systems: Review of Systems  Constitutional: Negative.   HENT: Negative.   Eyes: Negative.   Respiratory: Negative.   Cardiovascular: Negative.   Gastrointestinal: Negative.   Genitourinary: Negative.   Musculoskeletal: Negative.   Skin: Negative.   Neurological: Negative.   Endo/Heme/Allergies: Negative.   Psychiatric/Behavioral: Negative.    Marland Kitchen   Physical Exam:  weight is 208 lb 8 oz (94.6 kg). Her oral temperature is 98.5 F (36.9 C). Her blood pressure is 155/79 (abnormal) and her pulse is 66. Her respiration is 18 and oxygen saturation is 97%.   Wt Readings from Last 3  Encounters:  04/24/18 208 lb 8 oz (94.6 kg)  02/14/18 204 lb 12.8 oz (92.9 kg)  01/05/18 198 lb (89.8 kg)    Physical Exam  Constitutional: She is oriented to person, place, and time.  HENT:  Head: Normocephalic and atraumatic.  Mouth/Throat: Oropharynx is clear and moist.  Eyes: Pupils are equal, round, and reactive to light. EOM are normal.  Neck: Normal range of motion.  Cardiovascular: Normal rate, regular rhythm and normal heart sounds.  Pulmonary/Chest: Effort normal and breath sounds normal.  Abdominal: Soft. Bowel sounds are normal.  Musculoskeletal: Normal range of motion. She exhibits no edema, tenderness or  deformity.  Lymphadenopathy:    She has no cervical adenopathy.  Neurological: She is alert and oriented to person, place, and time.  Skin: Skin is warm and dry. No rash noted. No erythema.  Psychiatric: She has a normal mood and affect. Her behavior is normal. Judgment and thought content normal.  Vitals reviewed.    Lab Results  Component Value Date   WBC 9.3 04/24/2018   HGB 11.3 (L) 04/24/2018   HCT 35.0 (L) 04/24/2018   MCV 88.6 04/24/2018   PLT 349 04/24/2018   Lab Results  Component Value Date   FERRITIN 239 01/05/2018   IRON 53 01/05/2018   TIBC 279 01/05/2018   UIBC 226 01/05/2018   IRONPCTSAT 19 (L) 01/05/2018   Lab Results  Component Value Date   RBC 3.95 04/24/2018   No results found for: KPAFRELGTCHN, LAMBDASER, KAPLAMBRATIO No results found for: Kandis Cocking, IGMSERUM No results found for: Odetta Pink, SPEI   Chemistry      Component Value Date/Time   NA 138 04/24/2018 1100   NA 144 04/26/2017 0830   NA 137 09/30/2016 0940   K 4.2 04/24/2018 1100   K 4.1 04/26/2017 0830   K 4.7 09/30/2016 0940   CL 106 04/24/2018 1100   CL 108 04/26/2017 0830   CO2 27 04/24/2018 1100   CO2 27 04/26/2017 0830   CO2 18 (L) 09/30/2016 0940   BUN 26 (H) 04/24/2018 1100   BUN 25 (H) 04/26/2017 0830   BUN 21.5 09/30/2016 0940   CREATININE 1.70 (H) 04/24/2018 1100   CREATININE 1.5 (H) 04/26/2017 0830   CREATININE 1.8 (H) 09/30/2016 0940      Component Value Date/Time   CALCIUM 10.1 04/24/2018 1100   CALCIUM 9.9 04/26/2017 0830   CALCIUM 10.4 09/30/2016 0940   ALKPHOS 147 (H) 04/24/2018 1100   ALKPHOS 124 (H) 04/26/2017 0830   ALKPHOS 147 09/30/2016 0940   AST 32 04/24/2018 1100   AST 27 09/30/2016 0940   ALT 12 04/24/2018 1100   ALT 16 04/26/2017 0830   ALT 11 09/30/2016 0940   BILITOT 0.6 04/24/2018 1100   BILITOT 0.34 09/30/2016 0940      Impression and Plan: Melanie [name] is a very pleasant 83 yo  African American female with stage IIIB (T3N1M0) adenocarcinoma of the descending colon.  We tried her on systemic chemotherapy in the adjuvant setting and she tolerated this incredibly poorly.  She only had 3 cycles of FOLFOX.  She then recurred quickly.  We biopsied a liver lesion.  This was positive for adenocarcinoma.  She then underwent radio frequency ablation.  This was done in January 2019.  Again, hopefully we can employ intrahepatic therapy for her.  I think this would really be a good way to treat her.  I talked to she and her sister  about this.  They are definitely agreeable to this.  I will plan to see her back in early February.     [name], MD 11/26/201911:45 AM

## 2018-04-24 NOTE — Patient Instructions (Signed)
Implanted Port Home Guide An implanted port is a type of central line that is placed under the skin. Central lines are used to provide IV access when treatment or nutrition needs to be given through a person's veins. Implanted ports are used for long-term IV access. An implanted port may be placed because:  You need IV medicine that would be irritating to the small veins in your hands or arms.  You need long-term IV medicines, such as antibiotics.  You need IV nutrition for a long period.  You need frequent blood draws for lab tests.  You need dialysis.  Implanted ports are usually placed in the chest area, but they can also be placed in the upper arm, the abdomen, or the leg. An implanted port has two main parts:  Reservoir. The reservoir is round and will appear as a small, raised area under your skin. The reservoir is the part where a needle is inserted to give medicines or draw blood.  Catheter. The catheter is a thin, flexible tube that extends from the reservoir. The catheter is placed into a large vein. Medicine that is inserted into the reservoir goes into the catheter and then into the vein.  How will I care for my incision site? Do not get the incision site wet. Bathe or shower as directed by your health care provider. How is my port accessed? Special steps must be taken to access the port:  Before the port is accessed, a numbing cream can be placed on the skin. This helps numb the skin over the port site.  Your health care provider uses a sterile technique to access the port. ? Your health care provider must put on a mask and sterile gloves. ? The skin over your port is cleaned carefully with an antiseptic and allowed to dry. ? The port is gently pinched between sterile gloves, and a needle is inserted into the port.  Only "non-coring" port needles should be used to access the port. Once the port is accessed, a blood return should be checked. This helps ensure that the port  is in the vein and is not clogged.  If your port needs to remain accessed for a constant infusion, a clear (transparent) bandage will be placed over the needle site. The bandage and needle will need to be changed every week, or as directed by your health care provider.  Keep the bandage covering the needle clean and dry. Do not get it wet. Follow your health care provider's instructions on how to take a shower or bath while the port is accessed.  If your port does not need to stay accessed, no bandage is needed over the port.  What is flushing? Flushing helps keep the port from getting clogged. Follow your health care provider's instructions on how and when to flush the port. Ports are usually flushed with saline solution or a medicine called heparin. The need for flushing will depend on how the port is used.  If the port is used for intermittent medicines or blood draws, the port will need to be flushed: ? After medicines have been given. ? After blood has been drawn. ? As part of routine maintenance.  If a constant infusion is running, the port may not need to be flushed.  How long will my port stay implanted? The port can stay in for as long as your health care provider thinks it is needed. When it is time for the port to come out, surgery will be   done to remove it. The procedure is similar to the one performed when the port was put in. When should I seek immediate medical care? When you have an implanted port, you should seek immediate medical care if:  You notice a bad smell coming from the incision site.  You have swelling, redness, or drainage at the incision site.  You have more swelling or pain at the port site or the surrounding area.  You have a fever that is not controlled with medicine.  This information is not intended to replace advice given to you by your health care provider. Make sure you discuss any questions you have with your health care provider. Document  Released: 05/16/2005 Document Revised: 10/22/2015 Document Reviewed: 01/21/2013 Elsevier Interactive Patient Education  2017 [employer]  

## 2018-04-25 ENCOUNTER — Other Ambulatory Visit (HOSPITAL_COMMUNITY): Payer: Self-pay | Admitting: Interventional Radiology

## 2018-04-25 ENCOUNTER — Other Ambulatory Visit: Payer: Self-pay | Admitting: Radiology

## 2018-04-25 DIAGNOSIS — C787 Secondary malignant neoplasm of liver and intrahepatic bile duct: Secondary | ICD-10-CM

## 2018-04-25 DIAGNOSIS — C186 Malignant neoplasm of descending colon: Secondary | ICD-10-CM

## 2018-04-25 LAB — IRON AND TIBC
Iron: 47 ug/dL (ref 41–142)
Saturation Ratios: 18 % — ABNORMAL LOW (ref 21–57)
TIBC: 256 ug/dL (ref 236–444)
UIBC: 209 ug/dL (ref 120–384)

## 2018-04-25 LAB — CEA (IN HOUSE-CHCC): CEA (CHCC-In House): 27.44 ng/mL — ABNORMAL HIGH (ref 0.00–5.00)

## 2018-04-25 LAB — FERRITIN: Ferritin: 173 ng/mL (ref 11–307)

## 2018-05-08 ENCOUNTER — Encounter: Payer: Self-pay | Admitting: Radiology

## 2018-05-08 ENCOUNTER — Ambulatory Visit (HOSPITAL_COMMUNITY)
Admission: RE | Admit: 2018-05-08 | Discharge: 2018-05-08 | Disposition: A | Discharge status: Home / Self Care | Payer: Medicare Other | Source: Ambulatory Visit | Attending: Interventional Radiology | Admitting: Interventional Radiology

## 2018-05-08 ENCOUNTER — Ambulatory Visit
Admission: RE | Admit: 2018-05-08 | Discharge: 2018-05-08 | Disposition: A | Discharge status: Home / Self Care | Payer: Medicare Other | Source: Ambulatory Visit | Attending: Hematology & Oncology | Admitting: Hematology & Oncology

## 2018-05-08 DIAGNOSIS — C186 Malignant neoplasm of descending colon: Secondary | ICD-10-CM

## 2018-05-08 HISTORY — PX: IR RADIOLOGIST EVAL & MGMT: IMG5224

## 2018-05-08 NOTE — Progress Notes (Signed)
Chief Complaint: Metastatic colon carcinoma to the liver.  History of Present Illness: Melanie [name] is a 83 y.o. female status post prior thermal ablation of a metastatic lesion in segment 8 of the right lobe of the liver.  After ablation, there was persistent hypermetabolic activity along the anterior margin of ablation by PET scan in May which showed increased size by PET scan in August.  This led to a biopsy of the liver under ultrasound guidance on 01/12/2018 demonstrating metastatic colorectal adenocarcinoma.  Additional molecular testing was performed.  She did not tolerate FOLFOX chemotherapy previously.  Molecular testing did not show any actionable mutation for immunotherapy or targeted therapy.  Melanie [name] has been doing fairly well and has gained some weight.  She has some fatigue but no significant abdominal pain.  Her appetite is good.  She was scheduled to have an MRI of the abdomen with contrast earlier today for treatment planning.  This was canceled due to renal insufficiency and borderline GFR.  Past Medical History:  Diagnosis Date  . Arthritis   . Chronic thyroiditis   . Colon cancer ([place])    colon  . Colostomy in place [facility])    history of, no longer in place  . Essential hypertension   . Goals of care, counseling/discussion 09/30/2016  . Goiter   . Hypothyroidism     Past Surgical History:  Procedure Laterality Date  . COLON SURGERY  07/2016   colostomy, colorectal cancer, s/p chemotherapy  . COLONOSCOPY    . COLOSTOMY REVERSAL    . IR RADIOLOGIST EVAL & MGMT  04/27/2017  . IR RADIOLOGIST EVAL & MGMT  07/13/2017  . IR RADIOLOGIST EVAL & MGMT  11/08/2017  . IR RADIOLOGIST EVAL & MGMT  01/02/2018  . IR RADIOLOGIST EVAL & MGMT  05/08/2018  . NEPHRECTOMY  07/2016  . PORTA CATH INSERTION    . RADIOFREQUENCY ABLATION N/A 06/14/2017   Procedure: CT MICROWAVE THERMAL ABLATION;  Surgeon: Aletta Edouard, MD;  Location: [facility] ORS;  Service: Anesthesiology;  Laterality:  N/A;  . SPLENECTOMY  07/2016    Allergies: Patient has no known allergies.  Medications: Prior to Admission medications   Medication Sig Start Date End Date Taking? Authorizing Provider  amlodipine-atorvastatin (CADUET) 10-10 MG tablet Take 1 tablet by mouth daily.    [provider]  aspirin 81 MG chewable tablet Chew by mouth daily.    [provider]  Calcium Carbonate-Vitamin D (CALTRATE 600+D PO) Take 1 tablet by mouth at bedtime.    [provider]  Cyanocobalamin (VITAMIN B-12 PO) Take 1 tablet by mouth daily.    [provider]  ferrous sulfate 325 (65 FE) MG tablet Take 325 mg by mouth daily with breakfast.    [provider]  levothyroxine (SYNTHROID, LEVOTHROID) 50 MCG tablet Take 1 tablet (50 mcg total) by mouth daily before breakfast. 11/03/17   Ennever, Rudell Cobb, MD  lidocaine-prilocaine (EMLA) cream APPLY 1 APPLICATION TOPICALLY AS NEEDED. 01/11/18   [name], MD  metoprolol tartrate (LOPRESSOR) 25 MG tablet Take 50 mg by mouth 2 (two) times daily.     [provider]  Multiple Vitamin (MULTIVITAMIN WITH MINERALS) TABS tablet Take 1 tablet by mouth daily.    [provider]  potassium chloride SA (K-DUR,KLOR-CON) 20 MEQ tablet Take 1 tablet (20 mEq total) by mouth daily. 02/01/17   [name], MD     Family History  Problem Relation Age of Onset  .  Colon cancer Mother 5       Died last year at 77 years old  . Colon cancer Brother 27    Social History   Socioeconomic History  . Marital status: Widowed    Spouse name: Not on file  . Number of children: 0  . Years of education: 44  . Highest education level: Not on file  Occupational History  . 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 Financial resource strain: Not on file  . Food insecurity:    Worry: Not on file    Inability: Not on file  . Transportation needs:    Medical: Not on file    Non-medical: Not on file  Tobacco  Use  . Smoking status: Former Smoker    Packs/day: 1.00    Years: 35.00    Pack years: 35.00    Last attempt to quit: 1993    Years since quitting: 26.9  . Smokeless tobacco: Never Used  Substance and Sexual Activity  . Alcohol use: No  . Drug use: No  . Sexual activity: Not on file  Lifestyle  . Physical activity:    Days per week: Not on file    Minutes per session: Not on file  . Stress: Not on file  Relationships  . Social connections:    Talks on phone: Not on file    Gets together: Not on file    Attends religious service: Not on file    Active member of club or organization: Not on file    Attends meetings of clubs or organizations: Not on file    Relationship status: Not on file  Other Topics Concern  . Not on file  Social History Narrative   Lives alone   Retired   Caffeine- 1 cup daily   No children   High school education    ECOG Status: 1 - Symptomatic but completely ambulatory  Review of Systems: A 12 point ROS discussed and pertinent positives are indicated in the HPI above.  All other systems are negative.  Review of Systems  Constitutional: Positive for fatigue. Negative for activity change, appetite change, chills and fever.  Respiratory: Negative.   Cardiovascular: Negative.   Gastrointestinal: Negative.   Genitourinary: Negative.   Musculoskeletal: Negative.   Neurological: Negative.     Vital Signs: BP 140/77   Pulse 65   Temp 98 F (36.7 C) (Oral)   Resp 16   Ht _0  (1.626 m)   Wt 94.8 kg   SpO2 97%   BMI 35.87 kg/m   Physical Exam  Constitutional: She is oriented to person, place, and time. She appears well-developed and well-nourished. No distress.  Abdominal: Soft. She exhibits no distension and no mass. There is no tenderness. There is no rebound and no guarding.  Neurological: She is alert and oriented to person, place, and time.  Skin: She is not diaphoretic.  Vitals reviewed.   Imaging: Ir Radiologist Eval &  Mgmt  Result Date: 05/08/2018 Please refer to notes tab for details about interventional procedure. (Op Note)   Labs:  CBC: Recent Labs    01/05/18 0930 01/12/18 1118 02/14/18 1158 04/24/18 1100  WBC 9.5 10.0 9.3 9.3  HGB 11.5* 11.4* 11.3* 11.3*  HCT 34.4* 34.0* 34.0* 35.0*  PLT 353 377 345 349    COAGS: Recent Labs    06/12/17 1140 01/12/18 1118  INR 1.09 1.11  APTT  --  30    BMP: Recent Labs  06/12/17 1140 06/15/17 0403  11/03/17 0905 01/05/18 0930 02/14/18 1158 04/24/18 1100  NA 138 138   < > 137 140 138 138  K 3.9 4.2   < > 4.3 4.5 4.1 4.2  CL 108 108   < > 105 108 109* 106  CO2 24 26   < > _0 GLUCOSE 90 122*   < > 90 102 92 96  BUN 29* 28*   < > 38* 33* 26* 26*  CALCIUM 9.9 9.1   < > 10.9* 10.9* 10.2 10.1  CREATININE 1.31* 1.42*   < > 1.82* 1.80* 1.90* 1.70*  GFRNONAA 37* 34*  --  25*  --   --   --   GFRAA 43* 39*  --  29*  --   --   --    < > = values in this interval not displayed.    LIVER FUNCTION TESTS: Recent Labs    11/03/17 0905 01/05/18 0930 02/14/18 1158 04/24/18 1100  BILITOT 0.3 0.5 0.5 0.6  AST 36* 35 32 32  ALT _1 ALKPHOS 204* 208* 182* 147*  PROT 8.6* 8.4* 8.2* 8.0  ALBUMIN 3.7 3.4* 3.4* 3.4*    Assessment and Plan:  I met with Melanie [name] and her sister.  We reviewed transcatheter treatment options for residual and progressive metastatic colorectal carcinoma in the liver.  Her liver function on 04/24/2018 demonstrates mild elevation of alkaline phosphatase and normal transaminases.  The total bilirubin is normal at 0.6.  CEA is 27.4 and has been fairly stable over the last 4 months.  It was as low as 6.2 two months after liver ablation.  The primary transcatheter treatment option would be radioembolization with Y-90 microspheres.  Current liver function and performance status are adequate to tolerate Y-90 therapy.  One of the issues currently with Y-90 therapy is the need to administer iodinated contrast  for arteriography with need for planning arteriography, possible embolization and additional arteriography at the time of treatment.  Contrast administration would certainly be kept as small as possible given underlying renal insufficiency.  MRI of the abdomen with gadolinium was planned for earlier today in order to assess location and bulk of enhancing tumor for treatment planning purposes.  This is also important in determining volume of tumor relative to volume of the liver for dosing of Y-90.  Unfortunately, the MRI study was canceled due to borderline GFR. Melanie [name] will hydrate as much as possible over the next few days and attempt will be made to obtain the MRI with gadolinium on Friday.  I will get back in touch with Melanie [name] once the MRI is performed and interpreted.  She is interested in pursuing Y-90 therapy if she is a candidate.  She would like to wait until after the holidays to be treated.  Should renal function worsen, nephrology consultation may be helpful particularly in follow-up after potential contrast administration.  Thank you for this interesting consult.  I greatly enjoyed meeting Melanie [name] and look forward to participating in their care.  A copy of this report was sent to the requesting provider on this date.  Electronically SignedAletta Edouard T 05/08/2018, 2:32 PM     I spent a total of 25 Minutes in face to face in clinical consultation, greater than 50% of which was counseling/coordinating care for metastatic colon carcinoma to the liver.

## 2018-05-11 ENCOUNTER — Ambulatory Visit (HOSPITAL_COMMUNITY)
Admission: RE | Admit: 2018-05-11 | Discharge: 2018-05-11 | Disposition: A | Discharge status: Home / Self Care | Payer: Medicare Other | Source: Ambulatory Visit | Attending: Interventional Radiology | Admitting: Interventional Radiology

## 2018-05-11 ENCOUNTER — Other Ambulatory Visit (HOSPITAL_COMMUNITY): Payer: Self-pay | Admitting: Interventional Radiology

## 2018-05-11 DIAGNOSIS — C186 Malignant neoplasm of descending colon: Secondary | ICD-10-CM

## 2018-05-11 DIAGNOSIS — K769 Liver disease, unspecified: Secondary | ICD-10-CM | POA: Insufficient documentation

## 2018-05-11 DIAGNOSIS — R590 Localized enlarged lymph nodes: Secondary | ICD-10-CM | POA: Diagnosis not present

## 2018-05-11 DIAGNOSIS — Z9081 Acquired absence of spleen: Secondary | ICD-10-CM | POA: Insufficient documentation

## 2018-05-11 LAB — POCT I-STAT CREATININE: Creatinine, Ser: 1.9 mg/dL — ABNORMAL HIGH (ref 0.44–1.00)

## 2018-07-17 ENCOUNTER — Inpatient Hospital Stay: Payer: Medicare Other

## 2018-07-17 ENCOUNTER — Other Ambulatory Visit: Payer: Self-pay

## 2018-07-17 ENCOUNTER — Inpatient Hospital Stay: Payer: Medicare Other | Attending: Hematology & Oncology | Admitting: Hematology & Oncology

## 2018-07-17 VITALS — BP 151/70 | HR 71 | Temp 98.2°F | Resp 20 | Wt 208.0 lb

## 2018-07-17 DIAGNOSIS — Z79899 Other long term (current) drug therapy: Secondary | ICD-10-CM | POA: Diagnosis not present

## 2018-07-17 DIAGNOSIS — Z95828 Presence of other vascular implants and grafts: Secondary | ICD-10-CM

## 2018-07-17 DIAGNOSIS — C189 Malignant neoplasm of colon, unspecified: Secondary | ICD-10-CM

## 2018-07-17 DIAGNOSIS — C186 Malignant neoplasm of descending colon: Secondary | ICD-10-CM | POA: Diagnosis present

## 2018-07-17 DIAGNOSIS — C787 Secondary malignant neoplasm of liver and intrahepatic bile duct: Secondary | ICD-10-CM | POA: Insufficient documentation

## 2018-07-17 DIAGNOSIS — D500 Iron deficiency anemia secondary to blood loss (chronic): Secondary | ICD-10-CM

## 2018-07-17 DIAGNOSIS — Z7982 Long term (current) use of aspirin: Secondary | ICD-10-CM | POA: Diagnosis not present

## 2018-07-17 LAB — IRON AND TIBC
Iron: 37 ug/dL — ABNORMAL LOW (ref 41–142)
Saturation Ratios: 14 % — ABNORMAL LOW (ref 21–57)
TIBC: 255 ug/dL (ref 236–444)
UIBC: 218 ug/dL (ref 120–384)

## 2018-07-17 LAB — CBC WITH DIFFERENTIAL (CANCER CENTER ONLY)
Abs Immature Granulocytes: 0.08 10*3/uL — ABNORMAL HIGH (ref 0.00–0.07)
Basophils Absolute: 0.1 10*3/uL (ref 0.0–0.1)
Basophils Relative: 1 %
Eosinophils Absolute: 0.7 10*3/uL — ABNORMAL HIGH (ref 0.0–0.5)
Eosinophils Relative: 7 %
HCT: 34.1 % — ABNORMAL LOW (ref 36.0–46.0)
Hemoglobin: 11.1 g/dL — ABNORMAL LOW (ref 12.0–15.0)
Immature Granulocytes: 1 %
Lymphocytes Relative: 15 %
Lymphs Abs: 1.5 10*3/uL (ref 0.7–4.0)
MCH: 28.5 pg (ref 26.0–34.0)
MCHC: 32.6 g/dL (ref 30.0–36.0)
MCV: 87.7 fL (ref 80.0–100.0)
Monocytes Absolute: 1.4 10*3/uL — ABNORMAL HIGH (ref 0.1–1.0)
Monocytes Relative: 13 %
Neutro Abs: 6.7 10*3/uL (ref 1.7–7.7)
Neutrophils Relative %: 63 %
Platelet Count: 338 10*3/uL (ref 150–400)
RBC: 3.89 MIL/uL (ref 3.87–5.11)
RDW: 16.3 % — ABNORMAL HIGH (ref 11.5–15.5)
WBC Count: 10.5 10*3/uL (ref 4.0–10.5)
nRBC: 0 % (ref 0.0–0.2)

## 2018-07-17 LAB — CEA (IN HOUSE-CHCC): CEA (CHCC-In House): 40.35 ng/mL — ABNORMAL HIGH (ref 0.00–5.00)

## 2018-07-17 LAB — CMP (CANCER CENTER ONLY)
ALT: 9 U/L (ref 0–44)
AST: 25 U/L (ref 15–41)
Albumin: 4 g/dL (ref 3.5–5.0)
Alkaline Phosphatase: 191 U/L — ABNORMAL HIGH (ref 38–126)
Anion gap: 8 (ref 5–15)
BUN: 30 mg/dL — ABNORMAL HIGH (ref 8–23)
CO2: 24 mmol/L (ref 22–32)
Calcium: 9.8 mg/dL (ref 8.9–10.3)
Chloride: 104 mmol/L (ref 98–111)
Creatinine: 1.67 mg/dL — ABNORMAL HIGH (ref 0.44–1.00)
GFR, Est AFR Am: 33 mL/min — ABNORMAL LOW (ref >60)
GFR, Estimated: 28 mL/min — ABNORMAL LOW (ref >60)
Glucose, Bld: 114 mg/dL — ABNORMAL HIGH (ref 70–99)
Potassium: 4.1 mmol/L (ref 3.5–5.1)
Sodium: 136 mmol/L (ref 135–145)
Total Bilirubin: 0.4 mg/dL (ref 0.3–1.2)
Total Protein: 7.9 g/dL (ref 6.5–8.1)

## 2018-07-17 LAB — FERRITIN: Ferritin: 176 ng/mL (ref 11–307)

## 2018-07-17 MED ORDER — HEPARIN SOD (PORK) LOCK FLUSH 100 UNIT/ML IV SOLN
500.0000 [IU] | Freq: Once | INTRAVENOUS | Status: AC
  Administered 2018-07-17: 500 [IU] via INTRAVENOUS
  Filled 2018-07-17: qty 5

## 2018-07-17 MED ORDER — SODIUM CHLORIDE 0.9% FLUSH
10.0000 mL | INTRAVENOUS | Status: DC | PRN
  Administered 2018-07-17: 10 mL via INTRAVENOUS
  Filled 2018-07-17: qty 10

## 2018-07-17 NOTE — Patient Instructions (Signed)
Implanted Port Insertion, Care After  This sheet gives you information about how to care for yourself after your procedure. Your health care provider may also give you more specific instructions. If you have problems or questions, contact your health care provider.  What can I expect after the procedure?  After the procedure, it is common to have:  · Discomfort at the port insertion site.  · Bruising on the skin over the port. This should improve over 3-4 days.  Follow these instructions at home:  Port care  · After your port is placed, you will get a manufacturer's information card. The card has information about your port. Keep this card with you at all times.  · Take care of the port as told by your health care provider. Ask your health care provider if you or a family member can get training for taking care of the port at home. A home health care nurse may also take care of the port.  · Make sure to remember what type of port you have.  Incision care         · Follow instructions from your health care provider about how to take care of your port insertion site. Make sure you:  ? Wash your hands with soap and water before and after you change your bandage (dressing). If soap and water are not available, use hand sanitizer.  ? Change your dressing as told by your health care provider.  ? Leave stitches (sutures), skin glue, or adhesive strips in place. These skin closures may need to stay in place for 2 weeks or longer. If adhesive strip edges start to loosen and curl up, you may trim the loose edges. Do not remove adhesive strips completely unless your health care provider tells you to do that.  · Check your port insertion site every day for signs of infection. Check for:  ? Redness, swelling, or pain.  ? Fluid or blood.  ? Warmth.  ? Pus or a bad smell.  Activity  · Return to your normal activities as told by your health care provider. Ask your health care provider what activities are safe for you.  · Do not  lift anything that is heavier than 10 lb (4.5 kg), or the limit that you are told, until your health care provider says that it is safe.  General instructions  · Take over-the-counter and prescription medicines only as told by your health care provider.  · Do not take baths, swim, or use a hot tub until your health care provider approves. Ask your health care provider if you may take showers. You may only be allowed to take sponge baths.  · Do not drive for 24 hours if you were given a sedative during your procedure.  · Wear a medical alert bracelet in case of an emergency. This will tell any health care providers that you have a port.  · Keep all follow-up visits as told by your health care provider. This is important.  Contact a health care provider if:  · You cannot flush your port with saline as directed, or you cannot draw blood from the port.  · You have a fever or chills.  · You have redness, swelling, or pain around your port insertion site.  · You have fluid or blood coming from your port insertion site.  · Your port insertion site feels warm to the touch.  · You have pus or a bad smell coming from the port   insertion site.  Get help right away if:  · You have chest pain or shortness of breath.  · You have bleeding from your port that you cannot control.  Summary  · Take care of the port as told by your health care provider. Keep the manufacturer's information card with you at all times.  · Change your dressing as told by your health care provider.  · Contact a health care provider if you have a fever or chills or if you have redness, swelling, or pain around your port insertion site.  · Keep all follow-up visits as told by your health care provider.  This information is not intended to replace advice given to you by your health care provider. Make sure you discuss any questions you have with your health care provider.  Document Released: 03/06/2013 Document Revised: 12/12/2017 Document Reviewed:  12/12/2017  Elsevier Interactive Patient Education © 2019 [employer]

## 2018-07-17 NOTE — Progress Notes (Signed)
Hematology and Oncology Follow Up Visit  Melanie [name] [id number] May 23, 1935 84 y.o. 07/17/2018   Principle Diagnosis:  Metastatic colon cancer -- liver mets --  NO actionable mutation/LOW TMB/ MSI stable/ KRAS (+)/HER2-  Past Therapy:   FOLFOX q 14 days s/p cycle 3 - DC'd secondary to non-tolerance RFA surgery - January 2019  Current Therapy: Observation   Interim History:  Melanie [name] is here today with her sister for follow-up.  She is still doing quite well.  She had a nice holiday with for Christmas and New Year's.  She is still in the work-up for intrahepatic therapy for the recurrent colon cancer.  She has been seen by nephrology.  She has mild renal insufficiency.  Nephrology, apparently, gave the okay to have IV dye so that the arterial blood supply can be mapped out.  She hopefully will have this Y-90 therapy sometime in March.  Her last iron studies back in November showed a ferritin 173 with an iron saturation of 18%.  She goes back to see her family doctor in her hometown tomorrow.  Her last CEA level was 27.  She has had no change in bowel or bladder habits.  She has had no cough.  She has had no abdominal pain.  She has had no leg swelling.  She has had no headache.    Overall, I would say that her performance status is ECOG 1.   Medications:  Allergies as of 07/17/2018   No Known Allergies     Medication List       Accurate as of July 17, 2018  9:17 AM. Always use your most recent med list.        amlodipine-atorvastatin 10-10 MG tablet Commonly known as:  CADUET Take 1 tablet by mouth daily.   aspirin 81 MG chewable tablet Chew by mouth daily.   CALTRATE 600+D PO Take 1 tablet by mouth at bedtime.   ferrous sulfate 325 (65 FE) MG tablet Take 325 mg by mouth daily with breakfast.   levothyroxine 50 MCG tablet Commonly known as:  SYNTHROID, LEVOTHROID Take 1 tablet (50 mcg total) by mouth daily before breakfast.   lidocaine-prilocaine  cream Commonly known as:  EMLA APPLY 1 APPLICATION TOPICALLY AS NEEDED.   metoprolol tartrate 25 MG tablet Commonly known as:  LOPRESSOR Take 50 mg by mouth 2 (two) times daily.   multivitamin with minerals Tabs tablet Take 1 tablet by mouth daily.   potassium chloride SA 20 MEQ tablet Commonly known as:  K-DUR,KLOR-CON Take 1 tablet (20 mEq total) by mouth daily.   VITAMIN B-12 PO Take 1 tablet by mouth daily.       Allergies: No Known Allergies  Past Medical History, Surgical history, Social history, and Family History were reviewed and updated.  Review of Systems: Review of Systems  Constitutional: Negative.   HENT: Negative.   Eyes: Negative.   Respiratory: Negative.   Cardiovascular: Negative.   Gastrointestinal: Negative.   Genitourinary: Negative.   Musculoskeletal: Negative.   Skin: Negative.   Neurological: Negative.   Endo/Heme/Allergies: Negative.   Psychiatric/Behavioral: Negative.    Marland Kitchen   Physical Exam:  weight is 208 lb (94.3 kg). Her oral temperature is 98.2 F (36.8 C). Her blood pressure is 151/70 (abnormal) and her pulse is 71. Her respiration is 20 and oxygen saturation is 94%.   Wt Readings from Last 3 Encounters:  07/17/18 208 lb (94.3 kg)  05/08/18 209 lb (94.8 kg)  04/24/18 208 lb 8  oz (94.6 kg)    Physical Exam Vitals signs reviewed.  HENT:     Head: Normocephalic and atraumatic.  Eyes:     Pupils: Pupils are equal, round, and reactive to light.  Neck:     Musculoskeletal: Normal range of motion.  Cardiovascular:     Rate and Rhythm: Normal rate and regular rhythm.     Heart sounds: Normal heart sounds.  Pulmonary:     Effort: Pulmonary effort is normal.     Breath sounds: Normal breath sounds.  Abdominal:     General: Bowel sounds are normal.     Palpations: Abdomen is soft.  Musculoskeletal: Normal range of motion.        General: No tenderness or deformity.  Lymphadenopathy:     Cervical: No cervical adenopathy.   Skin:    General: Skin is warm and dry.     Findings: No erythema or rash.  Neurological:     Mental Status: She is alert and oriented to person, place, and time.  Psychiatric:        Behavior: Behavior normal.        Thought Content: Thought content normal.        Judgment: Judgment normal.      Lab Results  Component Value Date   WBC 10.5 07/17/2018   HGB 11.1 (L) 07/17/2018   HCT 34.1 (L) 07/17/2018   MCV 87.7 07/17/2018   PLT 338 07/17/2018   Lab Results  Component Value Date   FERRITIN 173 04/24/2018   IRON 47 04/24/2018   TIBC 256 04/24/2018   UIBC 209 04/24/2018   IRONPCTSAT 18 (L) 04/24/2018   Lab Results  Component Value Date   RBC 3.89 07/17/2018   No results found for: KPAFRELGTCHN, LAMBDASER, KAPLAMBRATIO No results found for: Kandis Cocking, IGMSERUM No results found for: Odetta Pink, SPEI   Chemistry      Component Value Date/Time   NA 138 04/24/2018 1100   NA 144 04/26/2017 0830   NA 137 09/30/2016 0940   K 4.2 04/24/2018 1100   K 4.1 04/26/2017 0830   K 4.7 09/30/2016 0940   CL 106 04/24/2018 1100   CL 108 04/26/2017 0830   CO2 27 04/24/2018 1100   CO2 27 04/26/2017 0830   CO2 18 (L) 09/30/2016 0940   BUN 26 (H) 04/24/2018 1100   BUN 25 (H) 04/26/2017 0830   BUN 21.5 09/30/2016 0940   CREATININE 1.90 (H) 05/11/2018 1419   CREATININE 1.70 (H) 04/24/2018 1100   CREATININE 1.5 (H) 04/26/2017 0830   CREATININE 1.8 (H) 09/30/2016 0940      Component Value Date/Time   CALCIUM 10.1 04/24/2018 1100   CALCIUM 9.9 04/26/2017 0830   CALCIUM 10.4 09/30/2016 0940   ALKPHOS 147 (H) 04/24/2018 1100   ALKPHOS 124 (H) 04/26/2017 0830   ALKPHOS 147 09/30/2016 0940   AST 32 04/24/2018 1100   AST 27 09/30/2016 0940   ALT 12 04/24/2018 1100   ALT 16 04/26/2017 0830   ALT 11 09/30/2016 0940   BILITOT 0.6 04/24/2018 1100   BILITOT 0.34 09/30/2016 0940      Impression and Plan: Melanie [name] is a  very pleasant 84 yo African American female with stage IIIB (T3N1M0) adenocarcinoma of the descending colon.  We tried her on systemic chemotherapy in the adjuvant setting and she tolerated this incredibly poorly.  She only had 3 cycles of FOLFOX.  She then recurred quickly.  We biopsied  a liver lesion.  This was positive for adenocarcinoma.  She then underwent radio frequency ablation.  This was done in January 2019.  Again, we can employ intrahepatic therapy for her.  I think this would really be a good way to treat her.  We will have her come back in about 6 or 7 weeks.  We will flush her Port-A-Cath.  Depending on her hemoglobin, we might give her IV iron.  [name], MD 2/18/20209:17 AM

## 2018-07-18 ENCOUNTER — Telehealth: Payer: Self-pay | Admitting: Hematology & Oncology

## 2018-07-18 ENCOUNTER — Telehealth: Payer: Self-pay | Admitting: *Deleted

## 2018-07-18 NOTE — Telephone Encounter (Signed)
Appointment scheduled LMVM for patient with date/time per 2/19 staff message

## 2018-07-18 NOTE — Telephone Encounter (Signed)
As noted below by Dr. Marin Olp, I informed the patient that her iron level is low. She will need to get one dose of IV iron in the next 1-2 weeks. She said,"I'm going out of town and don't know when I'll be back." I told her to call the office when she gets back to schedule the iron. Also, the CEA tumor marker is 40. She verbalized understanding.

## 2018-07-18 NOTE — Telephone Encounter (Signed)
-----   Message from [name], MD sent at 07/18/2018  6:54 AM EST ----- Call - the iron is low.  She needs 1 dose of IV iron in 1-2 weeks.  The CEA tumor marker is up from 27 to 40.  pete

## 2018-07-24 ENCOUNTER — Other Ambulatory Visit: Payer: Self-pay | Admitting: Interventional Radiology

## 2018-07-24 ENCOUNTER — Other Ambulatory Visit: Payer: Self-pay | Admitting: Family

## 2018-07-24 DIAGNOSIS — C787 Secondary malignant neoplasm of liver and intrahepatic bile duct: Principal | ICD-10-CM

## 2018-07-24 DIAGNOSIS — C189 Malignant neoplasm of colon, unspecified: Secondary | ICD-10-CM

## 2018-07-26 ENCOUNTER — Ambulatory Visit: Payer: Medicare Other

## 2018-08-17 ENCOUNTER — Telehealth: Payer: Self-pay | Admitting: *Deleted

## 2018-08-17 NOTE — Telephone Encounter (Signed)
Call received from patient's sister requesting to reschedule missed iron infusion appt from 07/26/18.  Pt.'s sister states that pt is "feeling fine" at this time.  Will add infusion appt on to next scheduled appt on 09/04/18 since pt is without complaints at this time.  Pt.'s sister agreeable to this plan and is appreciative of assistance.

## 2018-08-30 ENCOUNTER — Other Ambulatory Visit: Payer: Medicare Other

## 2018-09-04 ENCOUNTER — Encounter: Payer: Self-pay | Admitting: Hematology & Oncology

## 2018-09-04 ENCOUNTER — Inpatient Hospital Stay: Payer: Medicare Other

## 2018-09-04 ENCOUNTER — Inpatient Hospital Stay: Payer: Medicare Other | Attending: Hematology & Oncology | Admitting: Hematology & Oncology

## 2018-09-04 ENCOUNTER — Other Ambulatory Visit: Payer: Self-pay

## 2018-09-04 ENCOUNTER — Telehealth: Payer: Self-pay | Admitting: Hematology & Oncology

## 2018-09-04 VITALS — BP 137/77 | HR 80 | Resp 18

## 2018-09-04 VITALS — BP 148/76 | HR 86 | Temp 98.7°F | Resp 18 | Wt 204.0 lb

## 2018-09-04 DIAGNOSIS — I10 Essential (primary) hypertension: Secondary | ICD-10-CM | POA: Diagnosis not present

## 2018-09-04 DIAGNOSIS — C186 Malignant neoplasm of descending colon: Secondary | ICD-10-CM | POA: Diagnosis present

## 2018-09-04 DIAGNOSIS — C189 Malignant neoplasm of colon, unspecified: Secondary | ICD-10-CM

## 2018-09-04 DIAGNOSIS — C787 Secondary malignant neoplasm of liver and intrahepatic bile duct: Secondary | ICD-10-CM | POA: Diagnosis present

## 2018-09-04 DIAGNOSIS — Z7982 Long term (current) use of aspirin: Secondary | ICD-10-CM | POA: Insufficient documentation

## 2018-09-04 DIAGNOSIS — R143 Flatulence: Secondary | ICD-10-CM | POA: Insufficient documentation

## 2018-09-04 DIAGNOSIS — Z79899 Other long term (current) drug therapy: Secondary | ICD-10-CM | POA: Diagnosis not present

## 2018-09-04 DIAGNOSIS — R110 Nausea: Secondary | ICD-10-CM | POA: Diagnosis not present

## 2018-09-04 DIAGNOSIS — D500 Iron deficiency anemia secondary to blood loss (chronic): Secondary | ICD-10-CM

## 2018-09-04 DIAGNOSIS — Z95828 Presence of other vascular implants and grafts: Secondary | ICD-10-CM

## 2018-09-04 LAB — CBC WITH DIFFERENTIAL (CANCER CENTER ONLY)
Abs Immature Granulocytes: 0.2 10*3/uL — ABNORMAL HIGH (ref 0.00–0.07)
Basophils Absolute: 0.1 10*3/uL (ref 0.0–0.1)
Basophils Relative: 0 %
Eosinophils Absolute: 0.4 10*3/uL (ref 0.0–0.5)
Eosinophils Relative: 2 %
HCT: 34.9 % — ABNORMAL LOW (ref 36.0–46.0)
Hemoglobin: 11.5 g/dL — ABNORMAL LOW (ref 12.0–15.0)
Immature Granulocytes: 1 %
Lymphocytes Relative: 7 %
Lymphs Abs: 1.1 10*3/uL (ref 0.7–4.0)
MCH: 28.5 pg (ref 26.0–34.0)
MCHC: 33 g/dL (ref 30.0–36.0)
MCV: 86.4 fL (ref 80.0–100.0)
Monocytes Absolute: 1.6 10*3/uL — ABNORMAL HIGH (ref 0.1–1.0)
Monocytes Relative: 10 %
Neutro Abs: 13.2 10*3/uL — ABNORMAL HIGH (ref 1.7–7.7)
Neutrophils Relative %: 80 %
Platelet Count: 360 10*3/uL (ref 150–400)
RBC: 4.04 MIL/uL (ref 3.87–5.11)
RDW: 15.7 % — ABNORMAL HIGH (ref 11.5–15.5)
WBC Count: 16.5 10*3/uL — ABNORMAL HIGH (ref 4.0–10.5)
nRBC: 0 % (ref 0.0–0.2)

## 2018-09-04 LAB — RETICULOCYTES
Immature Retic Fract: 14.8 % (ref 2.3–15.9)
RBC.: 4.03 MIL/uL (ref 3.87–5.11)
Retic Count, Absolute: 57.2 10*3/uL (ref 19.0–186.0)
Retic Ct Pct: 1.4 % (ref 0.4–3.1)

## 2018-09-04 LAB — CMP (CANCER CENTER ONLY)
ALT: 9 U/L (ref 0–44)
AST: 20 U/L (ref 15–41)
Albumin: 3.6 g/dL (ref 3.5–5.0)
Alkaline Phosphatase: 213 U/L — ABNORMAL HIGH (ref 38–126)
Anion gap: 9 (ref 5–15)
BUN: 25 mg/dL — ABNORMAL HIGH (ref 8–23)
CO2: 24 mmol/L (ref 22–32)
Calcium: 9.7 mg/dL (ref 8.9–10.3)
Chloride: 101 mmol/L (ref 98–111)
Creatinine: 1.71 mg/dL — ABNORMAL HIGH (ref 0.44–1.00)
GFR, Est AFR Am: 32 mL/min — ABNORMAL LOW (ref >60)
GFR, Estimated: 28 mL/min — ABNORMAL LOW (ref >60)
Glucose, Bld: 124 mg/dL — ABNORMAL HIGH (ref 70–99)
Potassium: 3.8 mmol/L (ref 3.5–5.1)
Sodium: 134 mmol/L — ABNORMAL LOW (ref 135–145)
Total Bilirubin: 0.6 mg/dL (ref 0.3–1.2)
Total Protein: 8 g/dL (ref 6.5–8.1)

## 2018-09-04 LAB — CEA (IN HOUSE-CHCC): CEA (CHCC-In House): 141.34 ng/mL — ABNORMAL HIGH (ref 0.00–5.00)

## 2018-09-04 LAB — IRON AND TIBC
Iron: 30 ug/dL — ABNORMAL LOW (ref 41–142)
Saturation Ratios: 15 % — ABNORMAL LOW (ref 21–57)
TIBC: 199 ug/dL — ABNORMAL LOW (ref 236–444)
UIBC: 168 ug/dL (ref 120–384)

## 2018-09-04 LAB — FERRITIN: Ferritin: 338 ng/mL — ABNORMAL HIGH (ref 11–307)

## 2018-09-04 MED ORDER — SODIUM CHLORIDE 0.9% FLUSH
10.0000 mL | Freq: Once | INTRAVENOUS | Status: AC | PRN
  Administered 2018-09-04: 10 mL
  Filled 2018-09-04: qty 10

## 2018-09-04 MED ORDER — SODIUM CHLORIDE 0.9% FLUSH
3.0000 mL | Freq: Once | INTRAVENOUS | Status: DC | PRN
  Filled 2018-09-04: qty 10

## 2018-09-04 MED ORDER — SODIUM CHLORIDE 0.9 % IV SOLN
Freq: Once | INTRAVENOUS | Status: AC
  Administered 2018-09-04: 10:00:00 via INTRAVENOUS
  Filled 2018-09-04: qty 250

## 2018-09-04 MED ORDER — SODIUM CHLORIDE 0.9 % IV SOLN
510.0000 mg | Freq: Once | INTRAVENOUS | Status: AC
  Administered 2018-09-04: 10:00:00 510 mg via INTRAVENOUS
  Filled 2018-09-04: qty 17

## 2018-09-04 MED ORDER — HEPARIN SOD (PORK) LOCK FLUSH 100 UNIT/ML IV SOLN
500.0000 [IU] | Freq: Once | INTRAVENOUS | Status: AC
  Administered 2018-09-04: 11:00:00 500 [IU] via INTRAVENOUS
  Filled 2018-09-04: qty 5

## 2018-09-04 NOTE — Patient Instructions (Signed)

## 2018-09-04 NOTE — Telephone Encounter (Signed)
sched appts per 4/7 LOS. avs to be printed in infusion

## 2018-09-04 NOTE — Progress Notes (Signed)
Hematology and Oncology Follow Up Visit  Melanie [name] [id number] 05/21/1935 84 y.o. 09/04/2018   Principle Diagnosis:  Metastatic colon cancer -- liver mets --  NO actionable mutation/LOW TMB/ MSI stable/ KRAS (+)/HER2-  Past Therapy:   FOLFOX q 14 days s/p cycle 3 - DC'd secondary to non-tolerance RFA surgery - January 2019  Current Therapy: Observation   Interim History:  Melanie [name] is here today for follow-up.  She unfortunately had her appointment with interventional radiology rescheduled because of this coronavirus.  I am surprised that her appointment would have been rescheduled since she has an active cancer.  Her last CEA level was up to 40.4.  She feels well.  She says that she has some gas.  She does have some flatulence.  She does have some nausea on occasion.  There is no cough.  She has had no shortness of breath.  Her iron studies we last saw her were low.  Her ferritin was 176 and iron saturation 14%.  We will go ahead and give her some iron today.  She has had no fever.  She has had no bleeding.  There is been no leg swelling.  She has had no rashes.  Overall, her performance status is ECOG 1.    Medications:  Allergies as of 09/04/2018   No Known Allergies     Medication List       Accurate as of September 04, 2018  9:54 AM. Always use your most recent med list.        amlodipine-atorvastatin 10-10 MG tablet Commonly known as:  CADUET Take 1 tablet by mouth daily.   aspirin 81 MG chewable tablet Chew by mouth daily.   CALTRATE 600+D PO Take 1 tablet by mouth at bedtime.   ferrous sulfate 325 (65 FE) MG tablet Take 325 mg by mouth daily with breakfast.   levothyroxine 50 MCG tablet Commonly known as:  SYNTHROID, LEVOTHROID Take 1 tablet (50 mcg total) by mouth daily before breakfast.   lidocaine-prilocaine cream Commonly known as:  EMLA APPLY 1 APPLICATION TOPICALLY AS NEEDED.   metoprolol tartrate 25 MG tablet Commonly known as:  LOPRESSOR  Take 50 mg by mouth 2 (two) times daily.   multivitamin with minerals Tabs tablet Take 1 tablet by mouth daily.   potassium chloride SA 20 MEQ tablet Commonly known as:  K-DUR,KLOR-CON Take 1 tablet (20 mEq total) by mouth daily.   VITAMIN B-12 PO Take 1 tablet by mouth daily.       Allergies: No Known Allergies  Past Medical History, Surgical history, Social history, and Family History were reviewed and updated.  Review of Systems: Review of Systems  Constitutional: Negative.   HENT: Negative.   Eyes: Negative.   Respiratory: Negative.   Cardiovascular: Negative.   Gastrointestinal: Negative.   Genitourinary: Negative.   Musculoskeletal: Negative.   Skin: Negative.   Neurological: Negative.   Endo/Heme/Allergies: Negative.   Psychiatric/Behavioral: Negative.    Marland Kitchen   Physical Exam:  weight is 204 lb (92.5 kg). Her oral temperature is 98.7 F (37.1 C). Her blood pressure is 148/76 (abnormal) and her pulse is 86. Her respiration is 18 and oxygen saturation is 93%.   Wt Readings from Last 3 Encounters:  09/04/18 204 lb (92.5 kg)  07/17/18 208 lb (94.3 kg)  05/08/18 209 lb (94.8 kg)    Physical Exam Vitals signs reviewed.  HENT:     Head: Normocephalic and atraumatic.  Eyes:     Pupils: Pupils  are equal, round, and reactive to light.  Neck:     Musculoskeletal: Normal range of motion.  Cardiovascular:     Rate and Rhythm: Normal rate and regular rhythm.     Heart sounds: Normal heart sounds.  Pulmonary:     Effort: Pulmonary effort is normal.     Breath sounds: Normal breath sounds.  Abdominal:     General: Bowel sounds are normal.     Palpations: Abdomen is soft.  Musculoskeletal: Normal range of motion.        General: No tenderness or deformity.  Lymphadenopathy:     Cervical: No cervical adenopathy.  Skin:    General: Skin is warm and dry.     Findings: No erythema or rash.  Neurological:     Mental Status: She is alert and oriented to person,  place, and time.  Psychiatric:        Behavior: Behavior normal.        Thought Content: Thought content normal.        Judgment: Judgment normal.      Lab Results  Component Value Date   WBC 16.5 (H) 09/04/2018   HGB 11.5 (L) 09/04/2018   HCT 34.9 (L) 09/04/2018   MCV 86.4 09/04/2018   PLT 360 09/04/2018   Lab Results  Component Value Date   FERRITIN 176 07/17/2018   IRON 37 (L) 07/17/2018   TIBC 255 07/17/2018   UIBC 218 07/17/2018   IRONPCTSAT 14 (L) 07/17/2018   Lab Results  Component Value Date   RETICCTPCT 1.4 09/04/2018   RBC 4.03 09/04/2018   RBC 4.04 09/04/2018   No results found for: KPAFRELGTCHN, LAMBDASER, KAPLAMBRATIO No results found for: IGGSERUM, IGA, IGMSERUM No results found for: Kathrynn Ducking, MSPIKE, SPEI   Chemistry      Component Value Date/Time   NA 136 07/17/2018 0909   NA 144 04/26/2017 0830   NA 137 09/30/2016 0940   K 4.1 07/17/2018 0909   K 4.1 04/26/2017 0830   K 4.7 09/30/2016 0940   CL 104 07/17/2018 0909   CL 108 04/26/2017 0830   CO2 24 07/17/2018 0909   CO2 27 04/26/2017 0830   CO2 18 (L) 09/30/2016 0940   BUN 30 (H) 07/17/2018 0909   BUN 25 (H) 04/26/2017 0830   BUN 21.5 09/30/2016 0940   CREATININE 1.67 (H) 07/17/2018 0909   CREATININE 1.5 (H) 04/26/2017 0830   CREATININE 1.8 (H) 09/30/2016 0940      Component Value Date/Time   CALCIUM 9.8 07/17/2018 0909   CALCIUM 9.9 04/26/2017 0830   CALCIUM 10.4 09/30/2016 0940   ALKPHOS 191 (H) 07/17/2018 0909   ALKPHOS 124 (H) 04/26/2017 0830   ALKPHOS 147 09/30/2016 0940   AST 25 07/17/2018 0909   AST 27 09/30/2016 0940   ALT 9 07/17/2018 0909   ALT 16 04/26/2017 0830   ALT 11 09/30/2016 0940   BILITOT 0.4 07/17/2018 0909   BILITOT 0.34 09/30/2016 0940      Impression and Plan: Ms. Pendell is a very pleasant 84 yo African American female with stage IIIB (T3N1M0) adenocarcinoma of the descending colon.  We tried her on  systemic chemotherapy in the adjuvant setting and she tolerated this incredibly poorly.  She only had 3 cycles of FOLFOX.  She then recurred quickly.  We biopsied a liver lesion.  This was positive for adenocarcinoma.  She then underwent radio frequency ablation.  This was done in January 2019.  I really believe that we have to get treatment into the liver.  I think the Y-90 therapy is the way to go.  The other option I thought about was stereotactic radiosurgery.  Hopefully, interventional radiology will get her in soon so that they can begin the process of getting her treated.  We will have her come back in 2 more months.  She will have a Port-A-Cath flushed when we see her back.  [name], MD 4/7/20209:54 AM

## 2018-09-05 ENCOUNTER — Telehealth: Payer: Self-pay | Admitting: Interventional Radiology

## 2018-09-05 DIAGNOSIS — C787 Secondary malignant neoplasm of liver and intrahepatic bile duct: Principal | ICD-10-CM

## 2018-09-05 DIAGNOSIS — C189 Malignant neoplasm of colon, unspecified: Secondary | ICD-10-CM

## 2018-09-05 NOTE — Progress Notes (Signed)
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 Marin Olp.  Referring Physician(s): Dr. Burney Gauze  History of Present Illness: Melanie [name] is a 84 y.o. female status post prior thermal ablation of a right lobe metastasis from descending colon carcinoma on 06/14/2017. She had evidence of persistent hypermetabolic tumor after ablation which worsened by PET and was correlating to rising CEA level. Ultrasound guided biopsy of abnormal lobular tissue surrounding the area of prior ablation on 01/05/2018 demonstrated metastatic colorectal adenocarcinoma. I spoke to Melanie [name] and her sister last December about utilizing Y-90 radioembolization to treat worsening residual metastatic disease. MRI was performed without contrast due to renal insufficiency which was not very helpful in defining exact margins of viable metastatic tumor. She wanted to wait until after the holidays to consider radioembolization.  Melanie [name] saw Dr. Johnney Ou with [facility] on 07/16/2018 in anticipation of arteriographic procedures who thought it would be reasonable to proceed with minimal contrast exposure and IV hydration before and following arteriographic procedures. Melanie [name] was scheduled to see me on 08/30/2018 to discuss Y-90 preparation but our Clinic is currently closed due to the Covid-19 pandemic. Dr. Marin Olp contacted me yesterday because her CEA yesterday was 141, up from 40 on 07/17/18.  He would like to expedite her Y-90 radioembolization treatment.  Past Medical History:  Diagnosis Date  . Arthritis   . Chronic thyroiditis   . Colon cancer ([place])    colon  . Colostomy in place [facility])    history of, no longer in place  . Essential hypertension   . Goals of care, counseling/discussion 09/30/2016  . Goiter   . Hypothyroidism     Past Surgical History:  Procedure Laterality Date  . COLON SURGERY  07/2016   colostomy,  colorectal cancer, s/p chemotherapy  . COLONOSCOPY    . COLOSTOMY REVERSAL    . IR RADIOLOGIST EVAL & MGMT  04/27/2017  . IR RADIOLOGIST EVAL & MGMT  07/13/2017  . IR RADIOLOGIST EVAL & MGMT  11/08/2017  . IR RADIOLOGIST EVAL & MGMT  01/02/2018  . IR RADIOLOGIST EVAL & MGMT  05/08/2018  . NEPHRECTOMY  07/2016  . PORTA CATH INSERTION    . RADIOFREQUENCY ABLATION N/A 06/14/2017   Procedure: CT MICROWAVE THERMAL ABLATION;  Surgeon: Aletta Edouard, MD;  Location: [facility];  Service: Anesthesiology;  Laterality: N/A;  . SPLENECTOMY  07/2016    Allergies: Patient has no known allergies.  Medications: Prior to Admission medications   Medication Sig Start Date End Date Taking? Authorizing Provider  amlodipine-atorvastatin (CADUET) 10-10 MG tablet Take 1 tablet by mouth daily.    [provider]  aspirin 81 MG chewable tablet Chew by mouth daily.    [provider]  Calcium Carbonate-Vitamin D (CALTRATE 600+D PO) Take 1 tablet by mouth at bedtime.    [provider]  Cyanocobalamin (VITAMIN B-12 PO) Take 1 tablet by mouth daily.    [provider]  ferrous sulfate 325 (65 FE) MG tablet Take 325 mg by mouth daily with breakfast.    [provider]  levothyroxine (SYNTHROID, LEVOTHROID) 50 MCG tablet Take 1 tablet (50 mcg total) by mouth daily before breakfast. 11/03/17   Ennever, Rudell Cobb, MD  lidocaine-prilocaine (EMLA) cream APPLY 1 APPLICATION TOPICALLY AS NEEDED. 01/11/18   [name], MD  metoprolol tartrate (LOPRESSOR) 25 MG tablet Take 50 mg by mouth 2 (two) times daily.  [provider]  Multiple Vitamin (MULTIVITAMIN WITH MINERALS) TABS tablet Take 1 tablet by mouth daily.    [provider]  potassium chloride SA (K-DUR,KLOR-CON) 20 MEQ tablet Take 1 tablet (20 mEq total) by mouth daily. 02/01/17   [name], MD     Family History  Problem Relation Age of Onset  . Colon cancer Mother 41       Died last year at 68  years old  . Colon cancer Brother 17    Social History   Socioeconomic History  . Marital status: Widowed    Spouse name: Not on file  . Number of children: 0  . Years of education: 52  . Highest education level: Not on file  Occupational History  . 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 Financial resource strain: Not on file  . Food insecurity:    Worry: Not on file    Inability: Not on file  . Transportation needs:    Medical: Not on file    Non-medical: Not on file  Tobacco Use  . Smoking status: Former Smoker    Packs/day: 1.00    Years: 35.00    Pack years: 35.00    Last attempt to quit: 1993    Years since quitting: 27.2  . Smokeless tobacco: Never Used  Substance and Sexual Activity  . Alcohol use: No  . Drug use: No  . Sexual activity: Not on file  Lifestyle  . Physical activity:    Days per week: Not on file    Minutes per session: Not on file  . Stress: Not on file  Relationships  . Social connections:    Talks on phone: Not on file    Gets together: Not on file    Attends religious service: Not on file    Active member of club or organization: Not on file    Attends meetings of clubs or organizations: Not on file    Relationship status: Not on file  Other Topics Concern  . Not on file  Social History Narrative   Lives alone   Retired   Caffeine- 1 cup daily   No children   High school education    ECOG Status: 1 - Symptomatic but completely ambulatory  Review of Systems  Review of Systems: A 12 point ROS discussed and pertinent positives are indicated in the HPI above.  All other systems are negative.  Physical Exam No direct physical exam was performed (except for noted visual exam findings with Video Visits).    Vital Signs: There were no vitals taken for this visit.  Imaging: No results found.  Labs:  CBC: Recent Labs    02/14/18 1158 04/24/18 1100 07/17/18 0909 09/04/18 0929  WBC 9.3 9.3 10.5 16.5*  HGB  11.3* 11.3* 11.1* 11.5*  HCT 34.0* 35.0* 34.1* 34.9*  PLT 345 349 338 360    COAGS: Recent Labs    01/12/18 1118  INR 1.11  APTT 30    BMP: Recent Labs    11/03/17 0905  02/14/18 1158 04/24/18 1100 05/11/18 1419 07/17/18 0909 09/04/18 0929  NA 137   < > 138 138  --  136 134*  K 4.3   < > 4.1 4.2  --  4.1 3.8  CL 105   < > 109* 106  --  104 101  CO2 23   < > 26 27  --  24 24  GLUCOSE 90   < > 92  96  --  114* 124*  BUN 38*   < > 26* 26*  --  30* 25*  CALCIUM 10.9*   < > 10.2 10.1  --  9.8 9.7  CREATININE 1.82*   < > 1.90* 1.70* 1.90* 1.67* 1.71*  GFRNONAA 25*  --   --   --   --  28* 28*  GFRAA 29*  --   --   --   --  33* 32*   < > = values in this interval not displayed.    LIVER FUNCTION TESTS: Recent Labs    02/14/18 1158 04/24/18 1100 07/17/18 0909 09/04/18 0929  BILITOT 0.5 0.6 0.4 0.6  AST 32 32 25 20  ALT 17 12 9 9   ALKPHOS 182* 147* 191* 213*  PROT 8.2* 8.0 7.9 8.0  ALBUMIN 3.4* 3.4* 4.0 3.6    Assessment and Plan:  I performed a telemedicine visit with Melanie [name] and her sister Melanie [name] by phone today due to the Covid-19 pandemic to limit her exposure. She currently is fatigued and received IV iron yesterday. She has no pain, nausea, vomiting or significant weight loss. She is eating and drinking normally.  We again reviewed details of radioembolization with Y-90 microspheres including the need for preliminary mapping arteriography, embolization of gastric/duodenal branches and lung shunt fraction. This would then be followed by right lobe radioembolization approximately two weeks later. Both procedures are outpatient procedures not usually requiring admission or overnight observation. The main side effect from Y-90 treatment is fatigue beginning 24-48 hours after treatment and potentially lasting about a week. Significant pain is uncommon.  I did make Melanie [name] aware that due to block physician scheduling during the Covid pandemic in order to  limit exposure that her procedure(s) may be performed by different physicians other than myself in our Interventional division. I will discuss her case in detail with my partner(s) performing the procedures once she is scheduled. She is agreeable to other physicians participating in her treatment. We will begin the authorization process and the procedures will be scheduled at [facility] as soon as possible. We will plan for pre- and post-procedural IV hydration as recommended by Dr. Johnney Ou after the arteriographic procedures.  Thank you for this interesting consult.  I greatly enjoyed meeting Melanie [name] and look forward to participating in their care.  A copy of this report was sent to the requesting provider on this date.  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    Visit type: Audio only (telephone). Audio (no video) only due to patient's lack of internet/smartphone capability.  This visit type was conducted due to national recommendations for restrictions regarding the COVID-19 Pandemic (e.g. social distancing).  This format is felt to be most appropriate for this patient at this time.  All issues noted in this document were discussed and addressed.

## 2018-09-14 ENCOUNTER — Other Ambulatory Visit (HOSPITAL_COMMUNITY): Payer: Self-pay | Admitting: Interventional Radiology

## 2018-09-14 DIAGNOSIS — C189 Malignant neoplasm of colon, unspecified: Secondary | ICD-10-CM

## 2018-09-14 DIAGNOSIS — C787 Secondary malignant neoplasm of liver and intrahepatic bile duct: Principal | ICD-10-CM

## 2018-10-01 ENCOUNTER — Other Ambulatory Visit: Payer: Self-pay | Admitting: Radiology

## 2018-10-02 ENCOUNTER — Other Ambulatory Visit (HOSPITAL_COMMUNITY): Payer: Self-pay | Admitting: Interventional Radiology

## 2018-10-02 ENCOUNTER — Ambulatory Visit (HOSPITAL_COMMUNITY)
Admission: RE | Admit: 2018-10-02 | Discharge: 2018-10-02 | Disposition: A | Discharge status: Home / Self Care | Payer: Medicare Other | Source: Ambulatory Visit | Attending: Interventional Radiology | Admitting: Interventional Radiology

## 2018-10-02 ENCOUNTER — Other Ambulatory Visit: Payer: Self-pay

## 2018-10-02 ENCOUNTER — Encounter (HOSPITAL_COMMUNITY)
Admission: RE | Admit: 2018-10-02 | Discharge: 2018-10-02 | Disposition: A | Discharge status: Home / Self Care | Payer: Medicare Other | Source: Ambulatory Visit | Attending: Interventional Radiology | Admitting: Interventional Radiology

## 2018-10-02 ENCOUNTER — Encounter (HOSPITAL_COMMUNITY): Payer: Self-pay

## 2018-10-02 DIAGNOSIS — C787 Secondary malignant neoplasm of liver and intrahepatic bile duct: Principal | ICD-10-CM

## 2018-10-02 DIAGNOSIS — Z7982 Long term (current) use of aspirin: Secondary | ICD-10-CM | POA: Diagnosis not present

## 2018-10-02 DIAGNOSIS — C189 Malignant neoplasm of colon, unspecified: Secondary | ICD-10-CM | POA: Insufficient documentation

## 2018-10-02 DIAGNOSIS — Z9081 Acquired absence of spleen: Secondary | ICD-10-CM | POA: Insufficient documentation

## 2018-10-02 DIAGNOSIS — Z79890 Hormone replacement therapy (postmenopausal): Secondary | ICD-10-CM | POA: Insufficient documentation

## 2018-10-02 DIAGNOSIS — M1990 Unspecified osteoarthritis, unspecified site: Secondary | ICD-10-CM | POA: Insufficient documentation

## 2018-10-02 DIAGNOSIS — I10 Essential (primary) hypertension: Secondary | ICD-10-CM | POA: Diagnosis not present

## 2018-10-02 DIAGNOSIS — Z79899 Other long term (current) drug therapy: Secondary | ICD-10-CM | POA: Insufficient documentation

## 2018-10-02 DIAGNOSIS — E039 Hypothyroidism, unspecified: Secondary | ICD-10-CM | POA: Diagnosis not present

## 2018-10-02 DIAGNOSIS — Z905 Acquired absence of kidney: Secondary | ICD-10-CM | POA: Insufficient documentation

## 2018-10-02 HISTORY — PX: IR ANGIOGRAM VISCERAL SELECTIVE: IMG657

## 2018-10-02 HISTORY — PX: IR US GUIDE VASC ACCESS RIGHT: IMG2390

## 2018-10-02 HISTORY — PX: IR ANGIOGRAM SELECTIVE EACH ADDITIONAL VESSEL: IMG667

## 2018-10-02 LAB — COMPREHENSIVE METABOLIC PANEL
ALT: 11 U/L (ref 0–44)
AST: 24 U/L (ref 15–41)
Albumin: 3.6 g/dL (ref 3.5–5.0)
Alkaline Phosphatase: 178 U/L — ABNORMAL HIGH (ref 38–126)
Anion gap: 9 (ref 5–15)
BUN: 23 mg/dL (ref 8–23)
CO2: 22 mmol/L (ref 22–32)
Calcium: 9.2 mg/dL (ref 8.9–10.3)
Chloride: 105 mmol/L (ref 98–111)
Creatinine, Ser: 1.68 mg/dL — ABNORMAL HIGH (ref 0.44–1.00)
GFR calc Af Amer: 33 mL/min — ABNORMAL LOW (ref >60)
GFR calc non Af Amer: 28 mL/min — ABNORMAL LOW (ref >60)
Glucose, Bld: 138 mg/dL — ABNORMAL HIGH (ref 70–99)
Potassium: 3.5 mmol/L (ref 3.5–5.1)
Sodium: 136 mmol/L (ref 135–145)
Total Bilirubin: 0.5 mg/dL (ref 0.3–1.2)
Total Protein: 8.7 g/dL — ABNORMAL HIGH (ref 6.5–8.1)

## 2018-10-02 LAB — CBC WITH DIFFERENTIAL/PLATELET
Abs Immature Granulocytes: 0.12 10*3/uL — ABNORMAL HIGH (ref 0.00–0.07)
Basophils Absolute: 0.1 10*3/uL (ref 0.0–0.1)
Basophils Relative: 1 %
Eosinophils Absolute: 0.5 10*3/uL (ref 0.0–0.5)
Eosinophils Relative: 4 %
HCT: 37.5 % (ref 36.0–46.0)
Hemoglobin: 11.9 g/dL — ABNORMAL LOW (ref 12.0–15.0)
Immature Granulocytes: 1 %
Lymphocytes Relative: 13 %
Lymphs Abs: 1.6 10*3/uL (ref 0.7–4.0)
MCH: 28.4 pg (ref 26.0–34.0)
MCHC: 31.7 g/dL (ref 30.0–36.0)
MCV: 89.5 fL (ref 80.0–100.0)
Monocytes Absolute: 1.5 10*3/uL — ABNORMAL HIGH (ref 0.1–1.0)
Monocytes Relative: 12 %
Neutro Abs: 8.5 10*3/uL — ABNORMAL HIGH (ref 1.7–7.7)
Neutrophils Relative %: 69 %
Platelets: 363 10*3/uL (ref 150–400)
RBC: 4.19 MIL/uL (ref 3.87–5.11)
RDW: 17 % — ABNORMAL HIGH (ref 11.5–15.5)
WBC: 12.4 10*3/uL — ABNORMAL HIGH (ref 4.0–10.5)
nRBC: 0 % (ref 0.0–0.2)

## 2018-10-02 LAB — PROTIME-INR
INR: 1.1 (ref 0.8–1.2)
Prothrombin Time: 13.9 seconds (ref 11.4–15.2)

## 2018-10-02 MED ORDER — SODIUM CHLORIDE 0.9 % IV SOLN
INTRAVENOUS | Status: DC
  Administered 2018-10-02: 13:00:00 via INTRAVENOUS

## 2018-10-02 MED ORDER — MIDAZOLAM HCL 2 MG/2ML IJ SOLN
INTRAMUSCULAR | Status: AC | PRN
  Administered 2018-10-02: 1 mg via INTRAVENOUS
  Administered 2018-10-02: 0.5 mg via INTRAVENOUS

## 2018-10-02 MED ORDER — MIDAZOLAM HCL 2 MG/2ML IJ SOLN
INTRAMUSCULAR | Status: AC
  Filled 2018-10-02: qty 4

## 2018-10-02 MED ORDER — LIDOCAINE HCL (PF) 1 % IJ SOLN
INTRAMUSCULAR | Status: AC | PRN
  Administered 2018-10-02: 10 mL

## 2018-10-02 MED ORDER — IODIXANOL 320 MG/ML IV SOLN
50.0000 mL | Freq: Once | INTRAVENOUS | Status: AC | PRN
  Administered 2018-10-02: 20 mL via INTRA_ARTERIAL

## 2018-10-02 MED ORDER — TECHNETIUM TO 99M ALBUMIN AGGREGATED
5.3000 | Freq: Once | INTRAVENOUS | Status: AC | PRN
  Administered 2018-10-02: 5.3 via INTRAVENOUS

## 2018-10-02 MED ORDER — IODIXANOL 320 MG/ML IV SOLN
50.0000 mL | Freq: Once | INTRAVENOUS | Status: AC | PRN
  Administered 2018-10-02: 43 mL via INTRA_ARTERIAL

## 2018-10-02 MED ORDER — HEPARIN SOD (PORK) LOCK FLUSH 100 UNIT/ML IV SOLN
500.0000 [IU] | Freq: Once | INTRAVENOUS | Status: AC
  Administered 2018-10-02: 500 [IU] via INTRAVENOUS
  Filled 2018-10-02: qty 5

## 2018-10-02 MED ORDER — METOPROLOL TARTRATE 50 MG PO TABS
50.0000 mg | ORAL_TABLET | Freq: Once | ORAL | Status: AC
  Administered 2018-10-02: 50 mg via ORAL
  Filled 2018-10-02: qty 1

## 2018-10-02 MED ORDER — LIDOCAINE HCL 1 % IJ SOLN
INTRAMUSCULAR | Status: AC
  Filled 2018-10-02: qty 20

## 2018-10-02 MED ORDER — FENTANYL CITRATE (PF) 100 MCG/2ML IJ SOLN
INTRAMUSCULAR | Status: AC | PRN
  Administered 2018-10-02: 50 ug via INTRAVENOUS
  Administered 2018-10-02: 25 ug via INTRAVENOUS

## 2018-10-02 MED ORDER — SODIUM CHLORIDE 0.9 % IV SOLN
INTRAVENOUS | Status: DC
  Administered 2018-10-02: 09:00:00 via INTRAVENOUS

## 2018-10-02 MED ORDER — FENTANYL CITRATE (PF) 100 MCG/2ML IJ SOLN
INTRAMUSCULAR | Status: AC
  Filled 2018-10-02: qty 2

## 2018-10-02 NOTE — Procedures (Signed)
Interventional Radiology Procedure Note  Procedure: Hepatic angiogram, pre M40  Complications: None  Estimated Blood Loss: None  Recommendations: - To molecular imaging - Bedrest with leg straight x 4 hrs - DC home  Signed,  Criselda Peaches, MD

## 2018-10-02 NOTE — H&P (Signed)
Referring Physician(s): Ennever,P  Supervising Physician: Jacqulynn Cadet  Patient Status:  [facility]  Chief Complaint: Metastatic colon cancer to the liver   Subjective: Patient familiar to IR service from prior thermal ablation of a right hepatic lobe metastasis from colon cancer on 06/14/2017 with a liver lesion biopsy on 01/12/2018. She has a history of metastatic colon cancer to the liver with rising CEA level and evidence of residual metastatic disease to the liver.  She was deemed an appropriate candidate for Y 90 hepatic radioembolization and presents today for hepatic/visceral arterial roadmapping/embolization/test Y 90 dosing.  She currently denies fever, headache, chest pain, dyspnea, cough, abdominal/back pain, nausea, vomiting or bleeding.  Past Medical History:  Diagnosis Date  . Arthritis   . Chronic thyroiditis   . Colon cancer ([place])    colon  . Colostomy in place [facility])    history of, no longer in place  . Essential hypertension   . Goals of care, counseling/discussion 09/30/2016  . Goiter   . Hypothyroidism    Past Surgical History:  Procedure Laterality Date  . COLON SURGERY  07/2016   colostomy, colorectal cancer, s/p chemotherapy  . COLONOSCOPY    . COLOSTOMY REVERSAL    . IR RADIOLOGIST EVAL & MGMT  04/27/2017  . IR RADIOLOGIST EVAL & MGMT  07/13/2017  . IR RADIOLOGIST EVAL & MGMT  11/08/2017  . IR RADIOLOGIST EVAL & MGMT  01/02/2018  . IR RADIOLOGIST EVAL & MGMT  05/08/2018  . NEPHRECTOMY  07/2016  . PORTA CATH INSERTION    . RADIOFREQUENCY ABLATION N/A 06/14/2017   Procedure: CT MICROWAVE THERMAL ABLATION;  Surgeon: Aletta Edouard, MD;  Location: [facility];  Service: Anesthesiology;  Laterality: N/A;  . SPLENECTOMY  07/2016      Allergies: Patient has no known allergies.  Medications: Prior to Admission medications   Medication Sig Start Date End Date Taking? Authorizing Provider  amlodipine-atorvastatin (CADUET) 10-10 MG tablet Take 1 tablet by  mouth daily.    [provider]  aspirin 81 MG chewable tablet Chew by mouth daily.    [provider]  Calcium Carbonate-Vitamin D (CALTRATE 600+D PO) Take 1 tablet by mouth at bedtime.    [provider]  Cyanocobalamin (VITAMIN B-12 PO) Take 1 tablet by mouth daily.    [provider]  ferrous sulfate 325 (65 FE) MG tablet Take 325 mg by mouth daily with breakfast.    [provider]  levothyroxine (SYNTHROID, LEVOTHROID) 50 MCG tablet Take 1 tablet (50 mcg total) by mouth daily before breakfast. 11/03/17   Ennever, Rudell Cobb, MD  lidocaine-prilocaine (EMLA) cream APPLY 1 APPLICATION TOPICALLY AS NEEDED. 01/11/18   [name], MD  metoprolol tartrate (LOPRESSOR) 25 MG tablet Take 50 mg by mouth 2 (two) times daily.     [provider]  Multiple Vitamin (MULTIVITAMIN WITH MINERALS) TABS tablet Take 1 tablet by mouth daily.    [provider]  potassium chloride SA (K-DUR,KLOR-CON) 20 MEQ tablet Take 1 tablet (20 mEq total) by mouth daily. 02/01/17   [name], MD     Vital Signs: Blood pressure 160/82, heart rate 117, temp 98.2, respirations 18, O2 sats 91% room air   Physical Exam awake, alert.  Chest clear to auscultation bilaterally.  Clean, intact right chest wall Port-A-Cath.  Heart with regular rate and rhythm.  Abdomen soft, positive bowel sounds, nontender.  Trace to 1+ pretibial edema bilaterally.  Imaging: No results found.  Labs:  CBC: Recent Labs    02/14/18 1158 04/24/18 1100 07/17/18 0909 09/04/18 0929  WBC 9.3 9.3 10.5 16.5*  HGB 11.3* 11.3* 11.1* 11.5*  HCT 34.0* 35.0* 34.1* 34.9*  PLT 345 349 338 360    COAGS: Recent Labs    01/12/18 1118  INR 1.11  APTT 30    BMP: Recent Labs    11/03/17 0905  02/14/18 1158 04/24/18 1100 05/11/18 1419 07/17/18 0909 09/04/18 0929  NA 137   < > 138 138  --  136 134*  K 4.3   < > 4.1 4.2  --  4.1 3.8  CL 105   < > 109* 106  --  104 101  CO2 23    < > 26 27  --  24 24  GLUCOSE 90   < > 92 96  --  114* 124*  BUN 38*   < > 26* 26*  --  30* 25*  CALCIUM 10.9*   < > 10.2 10.1  --  9.8 9.7  CREATININE 1.82*   < > 1.90* 1.70* 1.90* 1.67* 1.71*  GFRNONAA 25*  --   --   --   --  28* 28*  GFRAA 29*  --   --   --   --  33* 32*   < > = values in this interval not displayed.    LIVER FUNCTION TESTS: Recent Labs    02/14/18 1158 04/24/18 1100 07/17/18 0909 09/04/18 0929  BILITOT 0.5 0.6 0.4 0.6  AST 32 32 25 20  ALT 17 12 9 9   ALKPHOS 182* 147* 191* 213*  PROT 8.2* 8.0 7.9 8.0  ALBUMIN 3.4* 3.4* 4.0 3.6    Assessment and Plan: Pt with history of metastatic colon cancer to the liver and prior thermal ablation of liver tumor in January 2019; now with rising CEA level and evidence of residual disease in liver on imaging. She was deemed an appropriate candidate for Y 90 hepatic radioembolization and presents today for hepatic/visceral arterial roadmapping/embolization/test Y 90 dosing.  Risks and benefits of procedure were discussed with the patient including, but not limited to bleeding, infection, vascular injury or contrast induced renal failure.  This interventional procedure involves the use of X-rays and because of the nature of the planned procedure, it is possible that we will have prolonged use of X-ray fluoroscopy.  Potential radiation risks to you include (but are not limited to) the following: - A slightly elevated risk for cancer  several years later in life. This risk is typically less than 0.5% percent. This risk is low in comparison to the normal incidence of human cancer, which is 33% for women and 50% for men according to the [place]. - Radiation induced injury can include skin redness, resembling a rash, tissue breakdown / ulcers and hair loss (which can be temporary or permanent).   The likelihood of either of these occurring depends on the difficulty of the procedure and whether you are sensitive to  radiation due to previous procedures, disease, or genetic conditions.   IF your procedure requires a prolonged use of radiation, you will be notified and given written instructions for further action.  It is your responsibility to monitor the irradiated area for the 2 weeks following the procedure and to notify your physician if you are concerned that you have suffered a radiation induced injury.    All of the patient's questions were answered, patient is agreeable to proceed.  Consent signed and in chart.  She does have a history of left nephrectomy as well as renal insufficiency and will require IV hydration pre-and post procedure per nephrology.   LABS PENDING  Electronically Signed: D. Rowe Robert, PA-C 10/02/2018, 8:24 AM   I spent a total of 25 minutes at the the patient's bedside AND on the patient's hospital floor or unit, greater than 50% of which was counseling/coordinating care for hepatic/visceral arteriogram with embolization and test Y 90 dosing

## 2018-10-02 NOTE — Discharge Instructions (Addendum)
Femoral Site Care This sheet gives you information about how to care for yourself after your procedure. Your health care provider may also give you more specific instructions. If you have problems or questions, contact your health care provider. What can I expect after the procedure? After the procedure, it is common to have:  Bruising that usually fades within 1-2 weeks.  Tenderness at the site. Follow these instructions at home: Wound care  Follow instructions from your health care provider about how to take care of your insertion site. Make sure you: ? Wash your hands with soap and water before you change your bandage (dressing). If soap and water are not available, use hand sanitizer. ? Change your dressing as told by your health care provider. May remove dressing and shower or bathe in 24 to 48 hours. May replace dressing with clean bandaid as needed.   Keep dressing site clean and dry.  Leave skin glue, or adhesive strips in place. These skin closures may need to stay in place for 2 weeks or longer. If adhesive strip edges start to loosen and curl up, you may trim the loose edges. Do not remove adhesive strips completely unless your health care provider tells you to do that. If glue is used let it flake off on its own.        ?    Do not take baths, swim, or use a hot tub until your health care provider approves. (site should have a well formed scab)  You may shower 24-48 hours after the procedure or as told by your health care provider. ? Gently wash the site with plain soap and water. ? Pat the area dry with a clean towel. ? Do not rub the site. This may cause bleeding.  Do not apply powder or lotion to the site. Keep the site clean and dry.  Check your femoral site every day for signs of infection. Check for: ? Redness, swelling, or pain. ? Fluid or blood. ? Warmth. ? Pus or a bad smell. Activity  For the first 2-3 days after your procedure, or as long as  directed: ? Avoid climbing stairs as much as possible. ? Do not squat.  Do not lift anything that is heavier than 10 lb (4.5 kg), or the limit that you are told, until your health care provider says that it is safe.  Rest as directed. ? Avoid sitting for a long time without moving. Get up to take short walks every 1-2 hours.  Do not drive for 24 hours if you were given a medicine to help you relax (sedative). General instructions  Take over-the-counter and prescription medicines only as told by your health care provider.  Keep all follow-up visits as told by your health care provider. This is important. Contact a health care provider if you have:  A fever or chills.  You have redness, swelling, or pain around your insertion site. Get help right away if:  The catheter insertion area swells very fast.  You pass out.  You suddenly start to sweat or your skin gets clammy.  The catheter insertion area is bleeding, and the bleeding does not stop when you hold steady pressure on the area.  The area near or just beyond the catheter insertion site becomes pale, cool, tingly, or numb. These symptoms may represent a serious problem that is an emergency. Do not wait to see if the symptoms will go away. Get medical help right away. Call your local  emergency services (911 in the [place] Do not drive yourself to the hospital. Summary  After the procedure, it is common to have bruising that usually fades within 1-2 weeks.  Check your femoral site every day for signs of infection.  Do not lift anything that is heavier than 10 lb (4.5 kg), or the limit that you are told, until your health care provider says that it is safe. This information is not intended to replace advice given to you by your health care provider. Make sure you discuss any questions you have with your health care provider. Document Released: 01/17/2014 Document Revised: 05/29/2017 Document Reviewed: 05/29/2017 Elsevier  Interactive Patient Education  2019 [place].  Moderate Conscious Sedation, Adult, Care After These instructions provide you with information about caring for yourself after your procedure. Your health care provider may also give you more specific instructions. Your treatment has been planned according to current medical practices, but problems sometimes occur. Call your health care provider if you have any problems or questions after your procedure. What can I expect after the procedure? After your procedure, it is common:  To feel sleepy for several hours.  To feel clumsy and have poor balance for several hours.  To have poor judgment for several hours.  To vomit if you eat too soon. Follow these instructions at home: For at least 24 hours after the procedure:   Do not: ? Participate in activities where you could fall or become injured. ? Drive. ? Use heavy machinery. ? Drink alcohol. ? Take sleeping pills or medicines that cause drowsiness. ? Make important decisions or sign legal documents. ? Take care of children on your own.  Rest. Eating and drinking  Follow the diet recommended by your health care provider.  If you vomit: ? Drink water, juice, or soup when you can drink without vomiting. ? Make sure you have little or no nausea before eating solid foods. General instructions  Have a responsible adult stay with you until you are awake and alert.  Take over-the-counter and prescription medicines only as told by your health care provider.  If you smoke, do not smoke without supervision.  Keep all follow-up visits as told by your health care provider. This is important. Contact a health care provider if:  You keep feeling nauseous or you keep vomiting.  You feel light-headed.  You develop a rash.  You have a fever. Get help right away if:  You have trouble breathing. This information is not intended to replace advice given to you by your health care  provider. Make sure you discuss any questions you have with your health care provider. Document Released: 03/06/2013 Document Revised: 10/19/2015 Document Reviewed: 09/05/2015 Elsevier Interactive Patient Education  2019 Elsevier In   Information for your next IR appointment  10/16/2018 (the following are the post procedure instructions for that visit) Post Y-90 Radioembolization Discharge Instructions  You have been given a radioactive material during your procedure.  While it is safe for you to be discharged home from the hospital, you need to proceed directly home.    Do not use public transportation, including air travel, lasting more than 2 hours for 1 week.  Avoid crowded public places for 1 week.  Adult visitors should try to avoid close contact with you for 1 week.    Children and pregnant females should not visit or have close contact with you for 1 week.  Items that you touch are not radioactive.  Do not sleep in  the same bed as your partner for 1 week, and a condom should be used for sexual activity during the first 24 hours.  Your blood may be radioactive and caution should be used if any bleeding occurs during the recovery period.  Body fluids may be radioactive for 24 hours.  Wash your hands after voiding.  Men should sit to urinate.  Dispose of any soiled materials (flush down toilet or place in trash at home) during the first day.  Drink 6 to 8 glasses of fluids per day for 5 days to hydrate yourself.  If you need to see a doctor during the first week, you must let them know that you were treated with yttrium-90 microspheres, and will be slightly radioactive.  They can call Interventional Radiology [phone] with any questions.

## 2018-10-03 ENCOUNTER — Other Ambulatory Visit: Payer: Self-pay | Admitting: Hematology & Oncology

## 2018-10-03 DIAGNOSIS — C186 Malignant neoplasm of descending colon: Secondary | ICD-10-CM

## 2018-10-03 NOTE — Progress Notes (Signed)
nm pet

## 2018-10-11 ENCOUNTER — Ambulatory Visit (HOSPITAL_COMMUNITY)
Admission: RE | Admit: 2018-10-11 | Discharge: 2018-10-11 | Disposition: A | Discharge status: Home / Self Care | Payer: Medicare Other | Source: Ambulatory Visit | Attending: Hematology & Oncology | Admitting: Hematology & Oncology

## 2018-10-11 ENCOUNTER — Other Ambulatory Visit: Payer: Self-pay

## 2018-10-11 DIAGNOSIS — R918 Other nonspecific abnormal finding of lung field: Secondary | ICD-10-CM | POA: Diagnosis not present

## 2018-10-11 DIAGNOSIS — R160 Hepatomegaly, not elsewhere classified: Secondary | ICD-10-CM | POA: Insufficient documentation

## 2018-10-11 DIAGNOSIS — C186 Malignant neoplasm of descending colon: Secondary | ICD-10-CM | POA: Diagnosis not present

## 2018-10-11 LAB — GLUCOSE, CAPILLARY: Glucose-Capillary: 98 mg/dL (ref 70–99)

## 2018-10-11 MED ORDER — FLUDEOXYGLUCOSE F - 18 (FDG) INJECTION
11.1000 | Freq: Once | INTRAVENOUS | Status: AC | PRN
  Administered 2018-10-11: 11.1 via INTRAVENOUS

## 2018-10-16 ENCOUNTER — Other Ambulatory Visit (HOSPITAL_COMMUNITY): Payer: Medicare Other

## 2018-10-16 ENCOUNTER — Telehealth: Payer: Self-pay | Admitting: Hematology & Oncology

## 2018-10-16 ENCOUNTER — Ambulatory Visit (HOSPITAL_COMMUNITY): Payer: Medicare Other

## 2018-10-16 NOTE — Telephone Encounter (Signed)
Called and spoke with patient's sister regarding appointment added for 5/26 per 5/19 result note/  Sister is ok with date/time

## 2018-10-23 ENCOUNTER — Inpatient Hospital Stay: Payer: Medicare Other | Attending: Hematology & Oncology | Admitting: Hematology & Oncology

## 2018-10-23 ENCOUNTER — Other Ambulatory Visit: Payer: Self-pay

## 2018-10-23 ENCOUNTER — Encounter: Payer: Self-pay | Admitting: Hematology & Oncology

## 2018-10-23 VITALS — BP 156/74 | HR 78 | Temp 98.2°F | Resp 18 | Wt 202.0 lb

## 2018-10-23 DIAGNOSIS — Z7982 Long term (current) use of aspirin: Secondary | ICD-10-CM | POA: Insufficient documentation

## 2018-10-23 DIAGNOSIS — R918 Other nonspecific abnormal finding of lung field: Secondary | ICD-10-CM | POA: Insufficient documentation

## 2018-10-23 DIAGNOSIS — I10 Essential (primary) hypertension: Secondary | ICD-10-CM | POA: Diagnosis not present

## 2018-10-23 DIAGNOSIS — Z79899 Other long term (current) drug therapy: Secondary | ICD-10-CM | POA: Diagnosis not present

## 2018-10-23 DIAGNOSIS — R591 Generalized enlarged lymph nodes: Secondary | ICD-10-CM | POA: Diagnosis not present

## 2018-10-23 DIAGNOSIS — C787 Secondary malignant neoplasm of liver and intrahepatic bile duct: Secondary | ICD-10-CM

## 2018-10-23 DIAGNOSIS — C186 Malignant neoplasm of descending colon: Secondary | ICD-10-CM

## 2018-10-24 ENCOUNTER — Telehealth: Payer: Self-pay | Admitting: Hematology & Oncology

## 2018-10-24 ENCOUNTER — Other Ambulatory Visit (HOSPITAL_COMMUNITY): Payer: Medicare Other

## 2018-10-24 ENCOUNTER — Ambulatory Visit (HOSPITAL_COMMUNITY): Payer: Medicare Other

## 2018-10-24 NOTE — Progress Notes (Signed)
Hematology and Oncology Follow Up Visit  Melanie [name] [id number] 08/16/1934 83 y.o. 10/24/2018   Principle Diagnosis:  Metastatic colon cancer -- progressive mets --  NO actionable mutation/LOW TMB/ MSI stable/ KRAS (+)/HER2-  Past Therapy:   FOLFOX q 14 days s/p cycle 3 - DC'd secondary to non-tolerance RFA surgery - January 2019  Current Therapy: Observation   Interim History:  Melanie [name] is here today for follow-up.  Unfortunately, we are looking a disease that is now outside the liver.  Melanie [name] had a marked elevation of Melanie [name] CEA level.  It went from 40 up to 141.  We were thinking about doing another intrahepatic embolization on Melanie [name].  However, radiology was worried that Melanie [name] may have had extrahepatic disease.  We went ahead and got a PET scan on Melanie [name].  Melanie [name] does have extrahepatic disease with bilateral pulmonary nodules and some lymphadenopathy.  Melanie [name] really had a horrible time with adjuvant chemotherapy when we treated Melanie [name] couple years ago.  As such, this may not be a great option.  Melanie [name] actually has a good performance status.  Melanie [name] is still active.  Melanie [name] is eating well.  Melanie [name] has had no nausea or vomiting.  There is no pain.  There is no change in bowel or bladder habits.  Melanie [name] has had no bleeding.  Overall, Melanie [name] performance status is ECOG 1.    Medications:  Allergies as of 10/23/2018   No Known Allergies     Medication List       Accurate as of Oct 23, 2018 11:59 PM. If you have any questions, ask your nurse or doctor.        amlodipine-atorvastatin 10-10 MG tablet Commonly known as:  CADUET Take 1 tablet by mouth daily.   aspirin 81 MG chewable tablet Chew by mouth daily.   CALTRATE 600+D PO Take 1 tablet by mouth at bedtime.   ferrous sulfate 325 (65 FE) MG tablet Take 325 mg by mouth daily with breakfast.   levothyroxine 50 MCG tablet Commonly known as:  SYNTHROID Take 1 tablet (50 mcg total) by mouth daily before breakfast.   lidocaine-prilocaine cream Commonly  known as:  EMLA APPLY 1 APPLICATION TOPICALLY AS NEEDED.   metoprolol tartrate 25 MG tablet Commonly known as:  LOPRESSOR Take 50 mg by mouth 2 (two) times daily.   multivitamin with minerals Tabs tablet Take 1 tablet by mouth daily.   potassium chloride SA 20 MEQ tablet Commonly known as:  K-DUR Take 1 tablet (20 mEq total) by mouth daily.   VITAMIN B-12 PO Take 1 tablet by mouth daily.       Allergies: No Known Allergies  Past Medical History, Surgical history, Social history, and Family History were reviewed and updated.  Review of Systems: Review of Systems  Constitutional: Negative.   HENT: Negative.   Eyes: Negative.   Respiratory: Negative.   Cardiovascular: Negative.   Gastrointestinal: Negative.   Genitourinary: Negative.   Musculoskeletal: Negative.   Skin: Negative.   Neurological: Negative.   Endo/Heme/Allergies: Negative.   Psychiatric/Behavioral: Negative.    .   Physical Exam:  weight is 202 lb (91.6 kg). Melanie [name] oral temperature is 98.2 F (36.8 C). Melanie [name] blood pressure is 156/74 (abnormal) and Melanie [name] pulse is 78. Melanie [name] respiration is 18 and oxygen saturation is 96%.   Wt Readings from Last 3 Encounters:  10/23/18 202 lb (91.6 kg)  10/02/18 205 lb (93 kg)  09/04/18 204 lb (92.5 kg)    Physical Exam Vitals signs   reviewed.  HENT:     Head: Normocephalic and atraumatic.  Eyes:     Pupils: Pupils are equal, round, and reactive to light.  Neck:     Musculoskeletal: Normal range of motion.  Cardiovascular:     Rate and Rhythm: Normal rate and regular rhythm.     Heart sounds: Normal heart sounds.  Pulmonary:     Effort: Pulmonary effort is normal.     Breath sounds: Normal breath sounds.  Abdominal:     General: Bowel sounds are normal.     Palpations: Abdomen is soft.  Musculoskeletal: Normal range of motion.        General: No tenderness or deformity.  Lymphadenopathy:     Cervical: No cervical adenopathy.  Skin:    General: Skin is warm and  dry.     Findings: No erythema or rash.  Neurological:     Mental Status: Melanie [name] is alert and oriented to person, place, and time.  Psychiatric:        Behavior: Behavior normal.        Thought Content: Thought content normal.        Judgment: Judgment normal.      Lab Results  Component Value Date   WBC 12.4 (H) 10/02/2018   HGB 11.9 (L) 10/02/2018   HCT 37.5 10/02/2018   MCV 89.5 10/02/2018   PLT 363 10/02/2018   Lab Results  Component Value Date   FERRITIN 338 (H) 09/04/2018   IRON 30 (L) 09/04/2018   TIBC 199 (L) 09/04/2018   UIBC 168 09/04/2018   IRONPCTSAT 15 (L) 09/04/2018   Lab Results  Component Value Date   RETICCTPCT 1.4 09/04/2018   RBC 4.19 10/02/2018   No results found for: KPAFRELGTCHN, LAMBDASER, KAPLAMBRATIO No results found for: IGGSERUM, IGA, IGMSERUM No results found for: Odetta Pink, SPEI   Chemistry      Component Value Date/Time   NA 136 10/02/2018 0840   NA 144 04/26/2017 0830   NA 137 09/30/2016 0940   K 3.5 10/02/2018 0840   K 4.1 04/26/2017 0830   K 4.7 09/30/2016 0940   CL 105 10/02/2018 0840   CL 108 04/26/2017 0830   CO2 22 10/02/2018 0840   CO2 27 04/26/2017 0830   CO2 18 (L) 09/30/2016 0940   BUN 23 10/02/2018 0840   BUN 25 (H) 04/26/2017 0830   BUN 21.5 09/30/2016 0940   CREATININE 1.68 (H) 10/02/2018 0840   CREATININE 1.71 (H) 09/04/2018 0929   CREATININE 1.5 (H) 04/26/2017 0830   CREATININE 1.8 (H) 09/30/2016 0940      Component Value Date/Time   CALCIUM 9.2 10/02/2018 0840   CALCIUM 9.9 04/26/2017 0830   CALCIUM 10.4 09/30/2016 0940   ALKPHOS 178 (H) 10/02/2018 0840   ALKPHOS 124 (H) 04/26/2017 0830   ALKPHOS 147 09/30/2016 0940   AST 24 10/02/2018 0840   AST 20 09/04/2018 0929   AST 27 09/30/2016 0940   ALT 11 10/02/2018 0840   ALT 9 09/04/2018 0929   ALT 16 04/26/2017 0830   ALT 11 09/30/2016 0940   BILITOT 0.5 10/02/2018 0840   BILITOT 0.6 09/04/2018 0929    BILITOT 0.34 09/30/2016 0940      Impression and Plan: Melanie [name] is a very pleasant 84 yo African American female with stage IIIB (T3N1M0) adenocarcinoma of the descending colon.  We tried Melanie [name] on systemic chemotherapy in the adjuvant setting and Melanie [name] tolerated this incredibly poorly.  Melanie [name] only had 3 cycles of FOLFOX.  Melanie [name] then recurred quickly.  We biopsied a liver lesion.  This was positive for adenocarcinoma.  Melanie [name] then underwent radio frequency ablation.  This was done in January 2019.  I think a good option for Melanie [name] might be Xeloda with Avastin.  I think this would be tolerated by Melanie [name].  I would not give Melanie [name] full dose Xeloda.  However, I think that there would be a benefit to Xeloda with Avastin.  Melanie [name] is K-RAS mutated so we cannot use Erbitux.  Melanie [name] 2 sisters were on the cell phone listening.  I went over my recommendations.  Again, I think that Xeloda with Avastin would not be a bad idea for Melanie [name].  It is oral for the most part.  The Avastin would be once every 3 weeks.  I spent about 40 minutes with Melanie [name].  Melanie [name] is really nice and really fun to talk to.  Melanie [name] has a great attitude.  Melanie [name] is going to think about treatment with the Xeloda/Avastin.  I told Melanie [name] to call us to let us know if Melanie [name] wants to take treatment.  If Melanie [name] does not wish to take treatment, I certainly would understand this.  This is somewhat complicated.  There are several factors that we have to take into consideration with respect to Melanie [name] treatment.    ENNEVER, PETE 5/27/20207:18 AM 

## 2018-10-24 NOTE — Telephone Encounter (Signed)
Spoke with patient to confirm 6/23 appt at 1130 am per 5/26 LOS

## 2018-10-26 ENCOUNTER — Telehealth: Payer: Self-pay | Admitting: *Deleted

## 2018-10-26 NOTE — Telephone Encounter (Signed)
Call placed to patient's sister per Dr. Antonieta Pert request to see if patient has decided if she would like to take Avastin/Xeloda yet.  Per pt.'s sister, patient is willing to take Xeloda and Avastin.  Dr. Marin Olp notified.

## 2018-10-29 ENCOUNTER — Other Ambulatory Visit (HOSPITAL_COMMUNITY): Payer: Medicare Other

## 2018-11-01 ENCOUNTER — Other Ambulatory Visit: Payer: Self-pay | Admitting: Hematology & Oncology

## 2018-11-01 MED ORDER — CAPECITABINE 500 MG PO TABS: 750.0000 mg/m2 | ORAL_TABLET | Freq: Two times a day (BID) | ORAL | 4 refills | Status: DC

## 2018-11-01 NOTE — Progress Notes (Signed)
DISCONTINUE ON PATHWAY REGIMEN - Colorectal     A cycle is every 14 days:     Oxaliplatin      Leucovorin      5-Fluorouracil      5-Fluorouracil   **Always confirm dose/schedule in your pharmacy ordering system**  REASON: Toxicities / Adverse Event PRIOR TREATMENT: COS67: mFOLFOX6 q14 Days x 6 Months TREATMENT RESPONSE: Partial Response (PR)  START ON PATHWAY REGIMEN - Colorectal     A cycle is every 21 days:     Irinotecan      Capecitabine      Bevacizumab-xxxx   **Always confirm dose/schedule in your pharmacy ordering system**  Patient Characteristics: Distant Metastases, Second Line, KRAS Mutation Positive/Unknown, BRAF Wild-Type/Unknown, Bevacizumab Eligible Therapeutic Status: Distant Metastases BRAF Mutation Status: Wild-Type (no mutation) KRAS/NRAS Mutation Status: Mutation Positive Line of Therapy: Second Line  Intent of Therapy: Non-Curative / Palliative Intent, Discussed with Patient

## 2018-11-02 ENCOUNTER — Telehealth: Payer: Self-pay | Admitting: Pharmacy Technician

## 2018-11-02 ENCOUNTER — Telehealth: Payer: Self-pay | Admitting: Pharmacist

## 2018-11-02 DIAGNOSIS — C186 Malignant neoplasm of descending colon: Secondary | ICD-10-CM

## 2018-11-02 NOTE — Telephone Encounter (Signed)
Oral Oncology Pharmacist Encounter  Received new prescription for Xeloda (capecitabine) for the treatment of recurrent metastatic colon cancer in conjunction with bevacizumab, planned duration until disease progression or unacceptable drug toxicity.  CMP from 10/02/2018 assessed, SCr elevated. Prescription dose and frequency assessed. Dose reduced appropriately for patient's CrCl.    Current medication list in Epic reviewed, no relevant DDIs with capecitabine identified.  Prescription has been e-scribed to the [facility] for benefits analysis and approval.  Oral Oncology Clinic will continue to follow for insurance authorization, copayment issues, initial counseling and start date.  Darl Pikes, PharmD, BCPS, [facility] Hematology/Oncology Clinical Pharmacist [facility] [phone]  11/02/2018 11:33 AM

## 2018-11-02 NOTE — Telephone Encounter (Signed)
Called patient and went over benefits information.  She is going to have her sister [name] call on Tuesday so I can go over the information with her.

## 2018-11-02 NOTE — Telephone Encounter (Signed)
Oral Oncology Patient Advocate Encounter  After completing a benefits investigation, prior authorization for Xeloda is not required at this time through Medicare Part B or [employer].  Patient's copay is $46.09 with Medicare B and $64.14 with [employer]  Federal BCBS secondary billing requires a paper claim.     [place] [facility] [place] Phone [phone] Fax [phone] 11/02/2018 11:52 AM

## 2018-11-06 ENCOUNTER — Ambulatory Visit: Payer: Medicare Other | Admitting: Hematology & Oncology

## 2018-11-06 ENCOUNTER — Other Ambulatory Visit: Payer: Medicare Other

## 2018-11-07 NOTE — Telephone Encounter (Signed)
Oral Chemotherapy Pharmacist Encounter  Patient Education I spoke with patient's caregiver, her sister Westley Chandler, for overview of new oral chemotherapy medication: Xeloda (capecitabine) for the treatment of recurrent metastatic colon cancer in conjunction with bevacizumab, planned duration until disease progression or unacceptable drug toxicity.  Counseled caregiver on administration, dosing, side effects, monitoring, drug-food interactions, safe handling, storage, and disposal. Patient will take 3 tablets (1,500 mg total) by mouth 2 (two) times daily after a meal. Take 3 pills twice a day for 14 days on and 7 days off.  Instructed to not start the Xeloda until 11/20/18 the day of her first bevacizumab infusion.  Side effects include but not limited to: diarrhea, N/V, hand-foot syndrome, fatigue, decreased hgb/plt/wbc, mouth irritation.    Reviewed with patient importance of keeping a medication schedule and plan for any missed doses.  Ms. Brett Albino voiced understanding and appreciation. All questions answered. Medication handout and calendar placed in the mail.  Provided the [facility] phone number. Ms. Brett Albino knows to call the office with questions or concerns. Oral Chemotherapy [facility] Clinic will continue to follow.  Darl Pikes, PharmD, BCPS, [facility] Hematology/Oncology Clinical Pharmacist [facility] [place] [facility] [phone]  11/07/2018 10:06 AM

## 2018-11-07 NOTE — Telephone Encounter (Signed)
Spoke with patients sister, [name].  I explained the insurance benefits and she understands that a paper claim will need to be submitted for reimbursement.  Medication education was also discussed by Clearnce Sorrel, Oral Oncology Pharmacist.

## 2018-11-08 MED ORDER — CAPECITABINE 500 MG PO TABS: 750.0000 mg/m2 | ORAL_TABLET | Freq: Two times a day (BID) | ORAL | 4 refills | Status: DC

## 2018-11-08 NOTE — Telephone Encounter (Signed)
Spoke with Melanie [name] and her sister [name] regarding first shipment of Xeloda.  I have scheduled delivery of medication to her sister's home in [place] for 6/17.  Patient will bring medication to her appointment on 6/23.

## 2018-11-08 NOTE — Addendum Note (Signed)
Addended by: Darl Pikes on: 11/08/2018 02:21 PM   Modules accepted: Orders

## 2018-11-13 MED FILL — XELODA 500 MG TABLET: 500 | 21 days supply | Qty: 84 | Fill #0

## 2018-11-20 ENCOUNTER — Encounter: Payer: Self-pay | Admitting: Hematology & Oncology

## 2018-11-20 ENCOUNTER — Inpatient Hospital Stay: Payer: Medicare Other | Attending: Hematology & Oncology

## 2018-11-20 ENCOUNTER — Other Ambulatory Visit: Payer: Self-pay

## 2018-11-20 ENCOUNTER — Other Ambulatory Visit: Payer: Self-pay | Admitting: *Deleted

## 2018-11-20 ENCOUNTER — Inpatient Hospital Stay: Payer: Medicare Other

## 2018-11-20 ENCOUNTER — Inpatient Hospital Stay (HOSPITAL_BASED_OUTPATIENT_CLINIC_OR_DEPARTMENT_OTHER): Payer: Medicare Other | Admitting: Hematology & Oncology

## 2018-11-20 VITALS — BP 143/90 | HR 64 | Temp 98.8°F | Resp 18 | Wt 200.0 lb

## 2018-11-20 VITALS — BP 133/73 | HR 64 | Resp 18

## 2018-11-20 DIAGNOSIS — E032 Hypothyroidism due to medicaments and other exogenous substances: Secondary | ICD-10-CM

## 2018-11-20 DIAGNOSIS — C787 Secondary malignant neoplasm of liver and intrahepatic bile duct: Secondary | ICD-10-CM | POA: Insufficient documentation

## 2018-11-20 DIAGNOSIS — C186 Malignant neoplasm of descending colon: Secondary | ICD-10-CM

## 2018-11-20 DIAGNOSIS — D500 Iron deficiency anemia secondary to blood loss (chronic): Secondary | ICD-10-CM

## 2018-11-20 DIAGNOSIS — Z7982 Long term (current) use of aspirin: Secondary | ICD-10-CM | POA: Diagnosis not present

## 2018-11-20 DIAGNOSIS — Z5112 Encounter for antineoplastic immunotherapy: Secondary | ICD-10-CM | POA: Insufficient documentation

## 2018-11-20 DIAGNOSIS — C189 Malignant neoplasm of colon, unspecified: Secondary | ICD-10-CM

## 2018-11-20 DIAGNOSIS — Z79899 Other long term (current) drug therapy: Secondary | ICD-10-CM | POA: Diagnosis not present

## 2018-11-20 LAB — CMP (CANCER CENTER ONLY)
ALT: 20 U/L (ref 0–44)
AST: 51 U/L — ABNORMAL HIGH (ref 15–41)
Albumin: 3.8 g/dL (ref 3.5–5.0)
Alkaline Phosphatase: 393 U/L — ABNORMAL HIGH (ref 38–126)
Anion gap: 9 (ref 5–15)
BUN: 24 mg/dL — ABNORMAL HIGH (ref 8–23)
CO2: 24 mmol/L (ref 22–32)
Calcium: 10.3 mg/dL (ref 8.9–10.3)
Chloride: 101 mmol/L (ref 98–111)
Creatinine: 1.57 mg/dL — ABNORMAL HIGH (ref 0.44–1.00)
GFR, Est AFR Am: 35 mL/min — ABNORMAL LOW (ref >60)
GFR, Estimated: 31 mL/min — ABNORMAL LOW (ref >60)
Glucose, Bld: 107 mg/dL — ABNORMAL HIGH (ref 70–99)
Potassium: 4 mmol/L (ref 3.5–5.1)
Sodium: 134 mmol/L — ABNORMAL LOW (ref 135–145)
Total Bilirubin: 0.5 mg/dL (ref 0.3–1.2)
Total Protein: 7.8 g/dL (ref 6.5–8.1)

## 2018-11-20 LAB — TOTAL PROTEIN, URINE DIPSTICK: Protein, ur: <30 mg/dL — AB

## 2018-11-20 LAB — CBC WITH DIFFERENTIAL (CANCER CENTER ONLY)
Abs Immature Granulocytes: 0.1 10*3/uL — ABNORMAL HIGH (ref 0.00–0.07)
Basophils Absolute: 0.1 10*3/uL (ref 0.0–0.1)
Basophils Relative: 1 %
Eosinophils Absolute: 0.4 10*3/uL (ref 0.0–0.5)
Eosinophils Relative: 3 %
HCT: 34 % — ABNORMAL LOW (ref 36.0–46.0)
Hemoglobin: 11 g/dL — ABNORMAL LOW (ref 12.0–15.0)
Immature Granulocytes: 1 %
Lymphocytes Relative: 12 %
Lymphs Abs: 1.4 10*3/uL (ref 0.7–4.0)
MCH: 28.2 pg (ref 26.0–34.0)
MCHC: 32.4 g/dL (ref 30.0–36.0)
MCV: 87.2 fL (ref 80.0–100.0)
Monocytes Absolute: 1.4 10*3/uL — ABNORMAL HIGH (ref 0.1–1.0)
Monocytes Relative: 12 %
Neutro Abs: 8.1 10*3/uL — ABNORMAL HIGH (ref 1.7–7.7)
Neutrophils Relative %: 71 %
Platelet Count: 374 10*3/uL (ref 150–400)
RBC: 3.9 MIL/uL (ref 3.87–5.11)
RDW: 17.7 % — ABNORMAL HIGH (ref 11.5–15.5)
WBC Count: 11.4 10*3/uL — ABNORMAL HIGH (ref 4.0–10.5)
nRBC: 0 % (ref 0.0–0.2)

## 2018-11-20 LAB — TSH: TSH: 8.675 u[IU]/mL — ABNORMAL HIGH (ref 0.308–3.960)

## 2018-11-20 LAB — IRON AND TIBC
Iron: 62 ug/dL (ref 41–142)
Saturation Ratios: 28 % (ref 21–57)
TIBC: 219 ug/dL — ABNORMAL LOW (ref 236–444)
UIBC: 157 ug/dL (ref 120–384)

## 2018-11-20 LAB — FERRITIN: Ferritin: 626 ng/mL — ABNORMAL HIGH (ref 11–307)

## 2018-11-20 LAB — LACTATE DEHYDROGENASE: LDH: 242 U/L — ABNORMAL HIGH (ref 98–192)

## 2018-11-20 LAB — CEA (IN HOUSE-CHCC): CEA (CHCC-In House): 83.87 ng/mL — ABNORMAL HIGH (ref 0.00–5.00)

## 2018-11-20 MED ORDER — SODIUM CHLORIDE 0.9 % IV SOLN
510.0000 mg | Freq: Once | INTRAVENOUS | Status: AC
  Administered 2018-11-20: 510 mg via INTRAVENOUS
  Filled 2018-11-20: qty 17

## 2018-11-20 MED ORDER — SODIUM CHLORIDE 0.9 % IV SOLN
14.2000 mg/kg | Freq: Once | INTRAVENOUS | Status: AC
  Administered 2018-11-20: 1300 mg via INTRAVENOUS
  Filled 2018-11-20: qty 48

## 2018-11-20 MED ORDER — SODIUM CHLORIDE 0.9 % IV SOLN
Freq: Once | INTRAVENOUS | Status: AC
  Administered 2018-11-20: 11:00:00 via INTRAVENOUS
  Filled 2018-11-20: qty 250

## 2018-11-20 MED ORDER — SODIUM CHLORIDE 0.9% FLUSH
10.0000 mL | INTRAVENOUS | Status: DC | PRN
  Administered 2018-11-20: 13:00:00 10 mL
  Filled 2018-11-20: qty 10

## 2018-11-20 MED ORDER — HEPARIN SOD (PORK) LOCK FLUSH 100 UNIT/ML IV SOLN
500.0000 [IU] | Freq: Once | INTRAVENOUS | Status: AC | PRN
  Administered 2018-11-20: 500 [IU]
  Filled 2018-11-20: qty 5

## 2018-11-20 NOTE — Patient Instructions (Addendum)
[facility] Discharge Instructions for Patients Receiving Chemotherapy  Today you received the following chemotherapy agents Avastin To help prevent nausea and vomiting after your treatment, we encourage you to take your nausea medication as prescribed.   If you develop nausea and vomiting that is not controlled by your nausea medication, call the clinic.   BELOW ARE SYMPTOMS THAT SHOULD BE REPORTED IMMEDIATELY:  *FEVER GREATER THAN 100.5 F  *CHILLS WITH OR WITHOUT FEVER  NAUSEA AND VOMITING THAT IS NOT CONTROLLED WITH YOUR NAUSEA MEDICATION  *UNUSUAL SHORTNESS OF BREATH  *UNUSUAL BRUISING OR BLEEDING  TENDERNESS IN MOUTH AND THROAT WITH OR WITHOUT PRESENCE OF ULCERS  *URINARY PROBLEMS  *BOWEL PROBLEMS  UNUSUAL RASH Items with * indicate a potential emergency and should be followed up as soon as possible.  Feel free to call the clinic should you have any questions or concerns. The clinic phone number is [phone].  Please show the [facility] at check-in to the Emergency Department and triage  nurse. Bevacizumab injection (Avastin) What is this medicine? BEVACIZUMAB (be va SIZ yoo mab) is a monoclonal antibody. It is used to treat many types of cancer. This medicine may be used for other purposes; ask your health care provider or pharmacist if you have questions. COMMON BRAND NAME(S): Avastin, MVASI What should I tell my health care provider before I take this medicine? They need to know if you have any of these conditions: -diabetes -heart disease -high blood pressure -history of coughing up blood -prior anthracycline chemotherapy (e.g., doxorubicin, daunorubicin, epirubicin) -recent or ongoing radiation therapy -recent or planning to have surgery -stroke -an unusual or allergic reaction to bevacizumab, hamster proteins, mouse proteins, other medicines, foods, dyes, or preservatives -pregnant or trying to get pregnant -breast-feeding How  should I use this medicine? This medicine is for infusion into a vein. It is given by a health care professional in a hospital or clinic setting. Talk to your pediatrician regarding the use of this medicine in children. Special care may be needed. Overdosage: If you think you have taken too much of this medicine contact a poison control center or emergency room at once. NOTE: This medicine is only for you. Do not share this medicine with others. What if I miss a dose? It is important not to miss your dose. Call your doctor or health care professional if you are unable to keep an appointment. What may interact with this medicine? Interactions are not expected. This list may not describe all possible interactions. Give your health care provider a list of all the medicines, herbs, non-prescription drugs, or dietary supplements you use. Also tell them if you smoke, drink alcohol, or use illegal drugs. Some items may interact with your medicine. What should I watch for while using this medicine? Your condition will be monitored carefully while you are receiving this medicine. You will need important blood work and urine testing done while you are taking this medicine. This medicine may increase your risk to bruise or bleed. Call your doctor or health care professional if you notice any unusual bleeding. This medicine should be started at least 28 days following major surgery and the site of the surgery should be totally healed. Check with your doctor before scheduling dental work or surgery while you are receiving this treatment. Talk to your doctor if you have recently had surgery or if you have a wound that has not healed. Do not become pregnant while taking this medicine or for 6  months after stopping it. Women should inform their doctor if they wish to become pregnant or think they might be pregnant. There is a potential for serious side effects to an unborn child. Talk to your health care professional  or pharmacist for more information. Do not breast-feed an infant while taking this medicine and for 6 months after the last dose. This medicine has caused ovarian failure in some women. This medicine may interfere with the ability to have a child. You should talk to your doctor or health care professional if you are concerned about your fertility. What side effects may I notice from receiving this medicine? Side effects that you should report to your doctor or health care professional as soon as possible: -allergic reactions like skin rash, itching or hives, swelling of the face, lips, or tongue -chest pain or chest tightness -chills -coughing up blood -high fever -seizures -severe constipation -signs and symptoms of bleeding such as bloody or black, tarry stools; red or dark-brown urine; spitting up blood or brown material that looks like coffee grounds; red spots on the skin; unusual bruising or bleeding from the eye, gums, or nose -signs and symptoms of a blood clot such as breathing problems; chest pain; severe, sudden headache; pain, swelling, warmth in the leg -signs and symptoms of a stroke like changes in vision; confusion; trouble speaking or understanding; severe headaches; sudden numbness or weakness of the face, arm or leg; trouble walking; dizziness; loss of balance or coordination -stomach pain -sweating -swelling of legs or ankles -vomiting -weight gain Side effects that usually do not require medical attention (report to your doctor or health care professional if they continue or are bothersome): -back pain -changes in taste -decreased appetite -dry skin -nausea -tiredness This list may not describe all possible side effects. Call your doctor for medical advice about side effects. You may report side effects to FDA at 1-800-FDA-1088. Where should I keep my medicine? This drug is given in a hospital or clinic and will not be stored at home. NOTE: This sheet is a summary.  It may not cover all possible information. If you have questions about this medicine, talk to your doctor, pharmacist, or health care provider.  2019 Elsevier/Gold Standard (2016-05-13 14:33:29)

## 2018-11-20 NOTE — Progress Notes (Signed)
Hematology and Oncology Follow Up Visit  Melanie [name] [id number] 1935/03/24 84 y.o. 11/20/2018   Principle Diagnosis:  Metastatic colon cancer -- progressive mets --  NO actionable mutation/LOW TMB/ MSI stable/ KRAS (+)/HER2-  Past Therapy:   FOLFOX q 14 days s/p cycle 3 - DC'd secondary to non-tolerance RFA surgery - January 2019  Current Therapy: Xeloda 1500 mg po BID (14/7) -- start on 11/20/2018 Avastin 7.5 mg/kg IV q 3 week -- start on 10/2018   Interim History:  Melanie [name] is here today for follow-up.   We will go ahead and start treatment on her.  I know she has thought long and hard about doing any treatment for the metastatic colon cancer.  Her disease is spreading fairly quickly.  She now has disease outside of the liver.  Her last CEA level was 141.  I thought that Xeloda with Avastin would be a reasonable way to go.  I think she will tolerate this.  Xeloda dose will be 1500 mg p.o. twice daily for 14 days on and 7 days off.  She has had no abdominal pain.  There is been no change in bowel or bladder habits.  There is been no bleeding.  Her iron studies were on the low side we last saw her.  Her ferritin was 338 with an iron saturation of only 15%.  She is fairly active.  She has had no headache.  There is been no rashes.  She has had no leg swelling.  Overall, her performance status is ECOG 1.    Medications:  Allergies as of 11/20/2018   No Known Allergies     Medication List       Accurate as of November 20, 2018 10:55 AM. If you have any questions, ask your nurse or doctor.        amlodipine-atorvastatin 10-10 MG tablet Commonly known as: CADUET Take 1 tablet by mouth daily.   aspirin 81 MG chewable tablet Chew by mouth daily.   CALTRATE 600+D PO Take 1 tablet by mouth at bedtime.   capecitabine 500 MG tablet Commonly known as: XELODA Take 3 tablets (1,500 mg total) by mouth 2 (two) times daily after a meal. Take for 14 days on and 7 days off.    ferrous sulfate 325 (65 FE) MG tablet Take 325 mg by mouth daily with breakfast.   levothyroxine 50 MCG tablet Commonly known as: SYNTHROID Take 1 tablet (50 mcg total) by mouth daily before breakfast.   lidocaine-prilocaine cream Commonly known as: EMLA APPLY 1 APPLICATION TOPICALLY AS NEEDED.   metoprolol tartrate 25 MG tablet Commonly known as: LOPRESSOR Take 50 mg by mouth 2 (two) times daily.   multivitamin with minerals Tabs tablet Take 1 tablet by mouth daily.   potassium chloride SA 20 MEQ tablet Commonly known as: K-DUR Take 1 tablet (20 mEq total) by mouth daily.   VITAMIN B-12 PO Take 1 tablet by mouth daily.       Allergies: No Known Allergies  Past Medical History, Surgical history, Social history, and Family History were reviewed and updated.  Review of Systems: Review of Systems  Constitutional: Negative.   HENT: Negative.   Eyes: Negative.   Respiratory: Negative.   Cardiovascular: Negative.   Gastrointestinal: Negative.   Genitourinary: Negative.   Musculoskeletal: Negative.   Skin: Negative.   Neurological: Negative.   Endo/Heme/Allergies: Negative.   Psychiatric/Behavioral: Negative.    Marland Kitchen   Physical Exam:  weight is 200 lb (90.7 kg).  Her oral temperature is 98.8 F (37.1 C). Her blood pressure is 143/90 (abnormal) and her pulse is 64. Her respiration is 18 and oxygen saturation is 97%.   Wt Readings from Last 3 Encounters:  11/20/18 200 lb (90.7 kg)  10/23/18 202 lb (91.6 kg)  10/02/18 205 lb (93 kg)    Physical Exam Vitals signs reviewed.  HENT:     Head: Normocephalic and atraumatic.  Eyes:     Pupils: Pupils are equal, round, and reactive to light.  Neck:     Musculoskeletal: Normal range of motion.  Cardiovascular:     Rate and Rhythm: Normal rate and regular rhythm.     Heart sounds: Normal heart sounds.  Pulmonary:     Effort: Pulmonary effort is normal.     Breath sounds: Normal breath sounds.  Abdominal:      General: Bowel sounds are normal.     Palpations: Abdomen is soft.  Musculoskeletal: Normal range of motion.        General: No tenderness or deformity.  Lymphadenopathy:     Cervical: No cervical adenopathy.  Skin:    General: Skin is warm and dry.     Findings: No erythema or rash.  Neurological:     Mental Status: She is alert and oriented to person, place, and time.  Psychiatric:        Behavior: Behavior normal.        Thought Content: Thought content normal.        Judgment: Judgment normal.      Lab Results  Component Value Date   WBC 11.4 (H) 11/20/2018   HGB 11.0 (L) 11/20/2018   HCT 34.0 (L) 11/20/2018   MCV 87.2 11/20/2018   PLT 374 11/20/2018   Lab Results  Component Value Date   FERRITIN 338 (H) 09/04/2018   IRON 30 (L) 09/04/2018   TIBC 199 (L) 09/04/2018   UIBC 168 09/04/2018   IRONPCTSAT 15 (L) 09/04/2018   Lab Results  Component Value Date   RETICCTPCT 1.4 09/04/2018   RBC 3.90 11/20/2018   No results found for: KPAFRELGTCHN, LAMBDASER, KAPLAMBRATIO No results found for: IGGSERUM, IGA, IGMSERUM No results found for: Melanie [name], MSPIKE, SPEI   Chemistry      Component Value Date/Time   NA 134 (L) 11/20/2018 0940   NA 144 04/26/2017 0830   NA 137 09/30/2016 0940   K 4.0 11/20/2018 0940   K 4.1 04/26/2017 0830   K 4.7 09/30/2016 0940   CL 101 11/20/2018 0940   CL 108 04/26/2017 0830   CO2 24 11/20/2018 0940   CO2 27 04/26/2017 0830   CO2 18 (L) 09/30/2016 0940   BUN 24 (H) 11/20/2018 0940   BUN 25 (H) 04/26/2017 0830   BUN 21.5 09/30/2016 0940   CREATININE 1.57 (H) 11/20/2018 0940   CREATININE 1.5 (H) 04/26/2017 0830   CREATININE 1.8 (H) 09/30/2016 0940      Component Value Date/Time   CALCIUM 10.3 11/20/2018 0940   CALCIUM 9.9 04/26/2017 0830   CALCIUM 10.4 09/30/2016 0940   ALKPHOS 393 (H) 11/20/2018 0940   ALKPHOS 124 (H) 04/26/2017 0830   ALKPHOS 147 09/30/2016 0940   AST 51 (H)  11/20/2018 0940   AST 27 09/30/2016 0940   ALT 20 11/20/2018 0940   ALT 16 04/26/2017 0830   ALT 11 09/30/2016 0940   BILITOT 0.5 11/20/2018 0940   BILITOT 0.34 09/30/2016 0940      Impression  and Plan: Melanie [name] is a very pleasant 84 yo African American female with stage IIIB (T3N1M0) adenocarcinoma of the descending colon.  We tried her on systemic chemotherapy in the adjuvant setting and she tolerated this incredibly poorly.  She only had 3 cycles of FOLFOX.  She then recurred quickly.  We biopsied a liver lesion.  This was positive for adenocarcinoma.  Hopefully, response with the Xeloda/Avastin protocol.  I think the CEA will definitely let us know what is going on.    We will go ahead with her Avastin today.  We will plan to get her back in 3 more weeks for her second cycle of treatment.  I spent about 35 minutes with her today.  We had to set up the Avastin.  She will need some IV iron today.    Jacquez Sheetz, PETE 6/23/202010:55 AM

## 2018-11-21 ENCOUNTER — Other Ambulatory Visit: Payer: Self-pay | Admitting: Hematology & Oncology

## 2018-11-21 ENCOUNTER — Telehealth: Payer: Self-pay | Admitting: *Deleted

## 2018-11-21 NOTE — Telephone Encounter (Signed)
Per Dr. Kathlene Cote no more f/u with Ms. Horsford unless referred back by Oncology.Cathren Harsh

## 2018-12-06 ENCOUNTER — Other Ambulatory Visit: Payer: Self-pay | Admitting: Hematology & Oncology

## 2018-12-06 MED FILL — XELODA 500 MG TABLET: 500 | 21 days supply | Qty: 84 | Fill #1

## 2018-12-11 ENCOUNTER — Other Ambulatory Visit: Payer: Self-pay

## 2018-12-11 ENCOUNTER — Encounter: Payer: Self-pay | Admitting: Family

## 2018-12-11 ENCOUNTER — Inpatient Hospital Stay: Payer: Medicare Other

## 2018-12-11 ENCOUNTER — Telehealth: Payer: Self-pay | Admitting: Hematology & Oncology

## 2018-12-11 ENCOUNTER — Inpatient Hospital Stay: Payer: Medicare Other | Attending: Hematology & Oncology | Admitting: Family

## 2018-12-11 VITALS — BP 148/66 | HR 64 | Temp 97.2°F | Resp 18 | Ht 63.0 in | Wt 204.0 lb

## 2018-12-11 DIAGNOSIS — R110 Nausea: Secondary | ICD-10-CM

## 2018-12-11 DIAGNOSIS — C787 Secondary malignant neoplasm of liver and intrahepatic bile duct: Secondary | ICD-10-CM | POA: Diagnosis present

## 2018-12-11 DIAGNOSIS — R21 Rash and other nonspecific skin eruption: Secondary | ICD-10-CM | POA: Insufficient documentation

## 2018-12-11 DIAGNOSIS — E785 Hyperlipidemia, unspecified: Secondary | ICD-10-CM | POA: Diagnosis not present

## 2018-12-11 DIAGNOSIS — C189 Malignant neoplasm of colon, unspecified: Secondary | ICD-10-CM

## 2018-12-11 DIAGNOSIS — Z79899 Other long term (current) drug therapy: Secondary | ICD-10-CM | POA: Insufficient documentation

## 2018-12-11 DIAGNOSIS — C186 Malignant neoplasm of descending colon: Secondary | ICD-10-CM | POA: Diagnosis present

## 2018-12-11 DIAGNOSIS — R5383 Other fatigue: Secondary | ICD-10-CM

## 2018-12-11 DIAGNOSIS — D500 Iron deficiency anemia secondary to blood loss (chronic): Secondary | ICD-10-CM

## 2018-12-11 DIAGNOSIS — Z7982 Long term (current) use of aspirin: Secondary | ICD-10-CM | POA: Insufficient documentation

## 2018-12-11 DIAGNOSIS — I10 Essential (primary) hypertension: Secondary | ICD-10-CM | POA: Diagnosis not present

## 2018-12-11 DIAGNOSIS — Z5112 Encounter for antineoplastic immunotherapy: Secondary | ICD-10-CM | POA: Diagnosis present

## 2018-12-11 LAB — CMP (CANCER CENTER ONLY)
ALT: 9 U/L (ref 0–44)
AST: 32 U/L (ref 15–41)
Albumin: 3.6 g/dL (ref 3.5–5.0)
Alkaline Phosphatase: 205 U/L — ABNORMAL HIGH (ref 38–126)
Anion gap: 7 (ref 5–15)
BUN: 24 mg/dL — ABNORMAL HIGH (ref 8–23)
CO2: 25 mmol/L (ref 22–32)
Calcium: 8.9 mg/dL (ref 8.9–10.3)
Chloride: 105 mmol/L (ref 98–111)
Creatinine: 1.45 mg/dL — ABNORMAL HIGH (ref 0.44–1.00)
GFR, Est AFR Am: 39 mL/min — ABNORMAL LOW (ref >60)
GFR, Estimated: 34 mL/min — ABNORMAL LOW (ref >60)
Glucose, Bld: 117 mg/dL — ABNORMAL HIGH (ref 70–99)
Potassium: 4.1 mmol/L (ref 3.5–5.1)
Sodium: 137 mmol/L (ref 135–145)
Total Bilirubin: 0.5 mg/dL (ref 0.3–1.2)
Total Protein: 7.7 g/dL (ref 6.5–8.1)

## 2018-12-11 LAB — CBC WITH DIFFERENTIAL (CANCER CENTER ONLY)
Abs Immature Granulocytes: 0.04 10*3/uL (ref 0.00–0.07)
Basophils Absolute: 0.1 10*3/uL (ref 0.0–0.1)
Basophils Relative: 1 %
Eosinophils Absolute: 0.6 10*3/uL — ABNORMAL HIGH (ref 0.0–0.5)
Eosinophils Relative: 7 %
HCT: 35.1 % — ABNORMAL LOW (ref 36.0–46.0)
Hemoglobin: 11.5 g/dL — ABNORMAL LOW (ref 12.0–15.0)
Immature Granulocytes: 0 %
Lymphocytes Relative: 17 %
Lymphs Abs: 1.6 10*3/uL (ref 0.7–4.0)
MCH: 29.5 pg (ref 26.0–34.0)
MCHC: 32.8 g/dL (ref 30.0–36.0)
MCV: 90 fL (ref 80.0–100.0)
Monocytes Absolute: 1.3 10*3/uL — ABNORMAL HIGH (ref 0.1–1.0)
Monocytes Relative: 14 %
Neutro Abs: 5.7 10*3/uL (ref 1.7–7.7)
Neutrophils Relative %: 61 %
Platelet Count: 335 10*3/uL (ref 150–400)
RBC: 3.9 MIL/uL (ref 3.87–5.11)
RDW: 20.7 % — ABNORMAL HIGH (ref 11.5–15.5)
WBC Count: 9.3 10*3/uL (ref 4.0–10.5)
nRBC: 0 % (ref 0.0–0.2)

## 2018-12-11 MED ORDER — ONDANSETRON HCL 4 MG PO TABS: 4.0000 mg | ORAL_TABLET | Freq: Three times a day (TID) | ORAL | 0 refills | Status: AC | PRN

## 2018-12-11 MED ORDER — SODIUM CHLORIDE 0.9 % IV SOLN
Freq: Once | INTRAVENOUS | Status: AC
  Administered 2018-12-11: 13:00:00 via INTRAVENOUS
  Filled 2018-12-11: qty 250

## 2018-12-11 MED ORDER — SODIUM CHLORIDE 0.9 % IV SOLN
14.2000 mg/kg | Freq: Once | INTRAVENOUS | Status: AC
  Administered 2018-12-11: 1300 mg via INTRAVENOUS
  Filled 2018-12-11: qty 48

## 2018-12-11 MED ORDER — HEPARIN SOD (PORK) LOCK FLUSH 100 UNIT/ML IV SOLN
500.0000 [IU] | Freq: Once | INTRAVENOUS | Status: AC | PRN
  Administered 2018-12-11: 14:00:00 500 [IU]
  Filled 2018-12-11: qty 5

## 2018-12-11 MED ORDER — SODIUM CHLORIDE 0.9% FLUSH
10.0000 mL | INTRAVENOUS | Status: DC | PRN
  Administered 2018-12-11: 10 mL
  Filled 2018-12-11: qty 10

## 2018-12-11 NOTE — Progress Notes (Signed)
Hematology and Oncology Follow Up Visit  Melanie [name] [id number] 12-May-1935 84 y.o. 12/11/2018   Principle Diagnosis:  Metastatic colon cancer -- progressive mets --  NO actionable mutation/LOW TMB/ MSI stable/ KRAS (+)/HER2-  Past Therapy:             FOLFOX q 14 days s/p cycle 3 - DC'd secondary to non-tolerance RFA surgery - January 2019  Current Therapy:   Xeloda 1500 mg po BID (14/7) -- start on 11/20/2018 Avastin 7.5 mg/kg IV q 3 week -- started on 06//23/2020   Interim History:  Melanie [name] is here today for follow-up and treatment. She has mild fatigue at times but states over all she is doing well.  She verbalized that she is taking the Xeloda as prescribed and will start cycle 2 tomorrow, today is day 7 off.  She had some nausea without vomiting after cycle 1 and would like something to take for this if it happens again. We will order her some zofran.  No episodes of bleeding. No bruising or petechiae.  She has had no fever, chills, cough, rash, dizziness, SOB, chest pain, palpitations, abdominal pain or changes in bowel or bladder habits.  No swelling, tenderness, numbness or tingling in her extremities.  She has some dark spots on the tips of several fingers on both hands. She will continue to watch this and let us know if it becomes worse.  She has maintained a very good appetite and is staying well hydrated. Her weight is stable.   ECOG Performance Status: 1 - Symptomatic but completely ambulatory  Medications:  Allergies as of 12/11/2018   No Known Allergies     Medication List       Accurate as of December 11, 2018 11:28 AM. If you have any questions, ask your nurse or doctor.        amlodipine-atorvastatin 10-10 MG tablet Commonly known as: CADUET Take 1 tablet by mouth daily.   aspirin 81 MG chewable tablet Chew by mouth daily.   CALTRATE 600+D PO Take 1 tablet by mouth at bedtime.   capecitabine 500 MG tablet Commonly known as: XELODA Take 3  tablets (1,500 mg total) by mouth 2 (two) times daily after a meal. Take for 14 days on and 7 days off.   ferrous sulfate 325 (65 FE) MG tablet Take 325 mg by mouth daily with breakfast.   levothyroxine 50 MCG tablet Commonly known as: SYNTHROID Take 1 tablet (50 mcg total) by mouth daily before breakfast.   lidocaine-prilocaine cream Commonly known as: EMLA APPLY 1 APPLICATION TOPICALLY AS NEEDED.   metoprolol tartrate 25 MG tablet Commonly known as: LOPRESSOR Take 50 mg by mouth 2 (two) times daily.   multivitamin with minerals Tabs tablet Take 1 tablet by mouth daily.   potassium chloride SA 20 MEQ tablet Commonly known as: K-DUR Take 1 tablet (20 mEq total) by mouth daily.   VITAMIN B-12 PO Take 1 tablet by mouth daily.       Allergies: No Known Allergies  Past Medical History, Surgical history, Social history, and Family History were reviewed and updated.  Review of Systems: All other 10 point review of systems is negative.   Physical Exam:  vitals were not taken for this visit.   Wt Readings from Last 3 Encounters:  11/20/18 200 lb (90.7 kg)  10/23/18 202 lb (91.6 kg)  10/02/18 205 lb (93 kg)    Ocular: Sclerae unicteric, pupils equal, round and reactive to light Ear-nose-throat: Oropharynx  clear, dentition fair Lymphatic: No cervical or supraclavicular adenopathy Lungs no rales or rhonchi, good excursion bilaterally Heart regular rate and rhythm, no murmur appreciated Abd soft, nontender, positive bowel sounds, no liver or spleen tip palpated on exam, no fluid wave  MSK no focal spinal tenderness, no joint edema Neuro: non-focal, well-oriented, appropriate affect Breasts: Deferred   Lab Results  Component Value Date   WBC 9.3 12/11/2018   HGB 11.5 (L) 12/11/2018   HCT 35.1 (L) 12/11/2018   MCV 90.0 12/11/2018   PLT 335 12/11/2018   Lab Results  Component Value Date   FERRITIN 626 (H) 11/20/2018   IRON 62 11/20/2018   TIBC 219 (L) 11/20/2018    UIBC 157 11/20/2018   IRONPCTSAT 28 11/20/2018   Lab Results  Component Value Date   RETICCTPCT 1.4 09/04/2018   RBC 3.90 12/11/2018   No results found for: KPAFRELGTCHN, LAMBDASER, KAPLAMBRATIO No results found for: IGGSERUM, IGA, IGMSERUM No results found for: Kathrynn Ducking, MSPIKE, SPEI   Chemistry      Component Value Date/Time   NA 134 (L) 11/20/2018 0940   NA 144 04/26/2017 0830   NA 137 09/30/2016 0940   K 4.0 11/20/2018 0940   K 4.1 04/26/2017 0830   K 4.7 09/30/2016 0940   CL 101 11/20/2018 0940   CL 108 04/26/2017 0830   CO2 24 11/20/2018 0940   CO2 27 04/26/2017 0830   CO2 18 (L) 09/30/2016 0940   BUN 24 (H) 11/20/2018 0940   BUN 25 (H) 04/26/2017 0830   BUN 21.5 09/30/2016 0940   CREATININE 1.57 (H) 11/20/2018 0940   CREATININE 1.5 (H) 04/26/2017 0830   CREATININE 1.8 (H) 09/30/2016 0940      Component Value Date/Time   CALCIUM 10.3 11/20/2018 0940   CALCIUM 9.9 04/26/2017 0830   CALCIUM 10.4 09/30/2016 0940   ALKPHOS 393 (H) 11/20/2018 0940   ALKPHOS 124 (H) 04/26/2017 0830   ALKPHOS 147 09/30/2016 0940   AST 51 (H) 11/20/2018 0940   AST 27 09/30/2016 0940   ALT 20 11/20/2018 0940   ALT 16 04/26/2017 0830   ALT 11 09/30/2016 0940   BILITOT 0.5 11/20/2018 0940   BILITOT 0.34 09/30/2016 0940       Impression and Plan: Melanie [name] is a very pleasant 84 yo African American female with adenocarcinoma of the descending colon that has recurred and is now metastatic. She started Xeloda and Avastin in June and has tolerated her first cycle well with the exception of some mild nausea.  I sent a prescription for Zofran to her pharmacy.  CEA in June was down to 83.87. Today's result is pending.  We will proceed with cycle 2 today as planned.  We will see her back in another 3 weeks.  She will contact our office with any questions or concerns. We can certainly see her sooner if needed.   Laverna Peace, NP  7/14/202011:28 AM

## 2018-12-11 NOTE — Telephone Encounter (Signed)
Return as needed per 7/14 los

## 2018-12-11 NOTE — Patient Instructions (Signed)
[facility] Discharge Instructions for Patients Receiving Chemotherapy  Today you received the following chemotherapy agents Avastin   To help prevent nausea and vomiting after your treatment, we encourage you to take your nausea medication as directed.    If you develop nausea and vomiting that is not controlled by your nausea medication, call the clinic.   BELOW ARE SYMPTOMS THAT SHOULD BE REPORTED IMMEDIATELY:  *FEVER GREATER THAN 100.5 F  *CHILLS WITH OR WITHOUT FEVER  NAUSEA AND VOMITING THAT IS NOT CONTROLLED WITH YOUR NAUSEA MEDICATION  *UNUSUAL SHORTNESS OF BREATH  *UNUSUAL BRUISING OR BLEEDING  TENDERNESS IN MOUTH AND THROAT WITH OR WITHOUT PRESENCE OF ULCERS  *URINARY PROBLEMS  *BOWEL PROBLEMS  UNUSUAL RASH Items with * indicate a potential emergency and should be followed up as soon as possible.  Feel free to call the clinic you have any questions or concerns. The clinic phone number is [phone].  Please show the CHEMO ALERT CARD at check-in to the Emergency Department and triage nurse.   

## 2018-12-12 ENCOUNTER — Telehealth: Payer: Self-pay | Admitting: *Deleted

## 2018-12-12 LAB — IRON AND TIBC
Iron: 83 ug/dL (ref 41–142)
Saturation Ratios: 30 % (ref 21–57)
TIBC: 274 ug/dL (ref 236–444)
UIBC: 191 ug/dL (ref 120–384)

## 2018-12-12 LAB — FERRITIN: Ferritin: 1054 ng/mL — ABNORMAL HIGH (ref 11–307)

## 2018-12-12 LAB — CEA (IN HOUSE-CHCC): CEA (CHCC-In House): 66.45 ng/mL — ABNORMAL HIGH (ref 0.00–5.00)

## 2018-12-12 NOTE — Telephone Encounter (Addendum)
Patient is aware of results  ----- Message from [name], MD sent at 12/12/2018  9:17 AM EDT ----- Call - the CEA tumor level is coming down!!  Melanie [name]

## 2018-12-27 MED FILL — XELODA 500 MG TABLET: 500 | 21 days supply | Qty: 84 | Fill #2

## 2019-01-01 ENCOUNTER — Inpatient Hospital Stay: Payer: Medicare Other

## 2019-01-01 ENCOUNTER — Inpatient Hospital Stay: Payer: Medicare Other | Attending: Hematology & Oncology | Admitting: Hematology & Oncology

## 2019-01-01 ENCOUNTER — Encounter: Payer: Self-pay | Admitting: Hematology & Oncology

## 2019-01-01 ENCOUNTER — Other Ambulatory Visit: Payer: Self-pay

## 2019-01-01 VITALS — BP 163/72 | HR 67 | Temp 98.3°F | Resp 17

## 2019-01-01 VITALS — BP 116/64 | HR 68 | Resp 20

## 2019-01-01 DIAGNOSIS — R110 Nausea: Secondary | ICD-10-CM

## 2019-01-01 DIAGNOSIS — C189 Malignant neoplasm of colon, unspecified: Secondary | ICD-10-CM

## 2019-01-01 DIAGNOSIS — M7989 Other specified soft tissue disorders: Secondary | ICD-10-CM | POA: Insufficient documentation

## 2019-01-01 DIAGNOSIS — R030 Elevated blood-pressure reading, without diagnosis of hypertension: Secondary | ICD-10-CM | POA: Insufficient documentation

## 2019-01-01 DIAGNOSIS — D500 Iron deficiency anemia secondary to blood loss (chronic): Secondary | ICD-10-CM

## 2019-01-01 DIAGNOSIS — Z79899 Other long term (current) drug therapy: Secondary | ICD-10-CM | POA: Insufficient documentation

## 2019-01-01 DIAGNOSIS — C787 Secondary malignant neoplasm of liver and intrahepatic bile duct: Secondary | ICD-10-CM | POA: Insufficient documentation

## 2019-01-01 DIAGNOSIS — C186 Malignant neoplasm of descending colon: Secondary | ICD-10-CM | POA: Diagnosis present

## 2019-01-01 DIAGNOSIS — Z5112 Encounter for antineoplastic immunotherapy: Secondary | ICD-10-CM | POA: Diagnosis present

## 2019-01-01 LAB — CMP (CANCER CENTER ONLY)
ALT: 5 U/L (ref 0–44)
AST: 22 U/L (ref 15–41)
Albumin: 3.5 g/dL (ref 3.5–5.0)
Alkaline Phosphatase: 156 U/L — ABNORMAL HIGH (ref 38–126)
Anion gap: 8 (ref 5–15)
BUN: 20 mg/dL (ref 8–23)
CO2: 24 mmol/L (ref 22–32)
Calcium: 9 mg/dL (ref 8.9–10.3)
Chloride: 105 mmol/L (ref 98–111)
Creatinine: 1.44 mg/dL — ABNORMAL HIGH (ref 0.44–1.00)
GFR, Est AFR Am: 39 mL/min — ABNORMAL LOW (ref >60)
GFR, Estimated: 34 mL/min — ABNORMAL LOW (ref >60)
Glucose, Bld: 142 mg/dL — ABNORMAL HIGH (ref 70–99)
Potassium: 4.1 mmol/L (ref 3.5–5.1)
Sodium: 137 mmol/L (ref 135–145)
Total Bilirubin: 0.3 mg/dL (ref 0.3–1.2)
Total Protein: 7.4 g/dL (ref 6.5–8.1)

## 2019-01-01 LAB — CBC WITH DIFFERENTIAL (CANCER CENTER ONLY)
Abs Immature Granulocytes: 0.1 10*3/uL — ABNORMAL HIGH (ref 0.00–0.07)
Basophils Absolute: 0.1 10*3/uL (ref 0.0–0.1)
Basophils Relative: 1 %
Eosinophils Absolute: 0.5 10*3/uL (ref 0.0–0.5)
Eosinophils Relative: 5 %
HCT: 35.7 % — ABNORMAL LOW (ref 36.0–46.0)
Hemoglobin: 11.8 g/dL — ABNORMAL LOW (ref 12.0–15.0)
Immature Granulocytes: 1 %
Lymphocytes Relative: 18 %
Lymphs Abs: 1.7 10*3/uL (ref 0.7–4.0)
MCH: 30.7 pg (ref 26.0–34.0)
MCHC: 33.1 g/dL (ref 30.0–36.0)
MCV: 93 fL (ref 80.0–100.0)
Monocytes Absolute: 1.2 10*3/uL — ABNORMAL HIGH (ref 0.1–1.0)
Monocytes Relative: 13 %
Neutro Abs: 5.8 10*3/uL (ref 1.7–7.7)
Neutrophils Relative %: 62 %
Platelet Count: 390 10*3/uL (ref 150–400)
RBC: 3.84 MIL/uL — ABNORMAL LOW (ref 3.87–5.11)
RDW: 23.1 % — ABNORMAL HIGH (ref 11.5–15.5)
WBC Count: 9.3 10*3/uL (ref 4.0–10.5)
nRBC: 0.2 % (ref 0.0–0.2)

## 2019-01-01 LAB — IRON AND TIBC
Iron: 116 ug/dL (ref 41–142)
Saturation Ratios: 39 % (ref 21–57)
TIBC: 296 ug/dL (ref 236–444)
UIBC: 181 ug/dL (ref 120–384)

## 2019-01-01 LAB — FERRITIN: Ferritin: 840 ng/mL — ABNORMAL HIGH (ref 11–307)

## 2019-01-01 MED ORDER — SODIUM CHLORIDE 0.9 % IV SOLN
Freq: Once | INTRAVENOUS | Status: AC
  Administered 2019-01-01: 11:00:00 via INTRAVENOUS
  Filled 2019-01-01: qty 250

## 2019-01-01 MED ORDER — SODIUM CHLORIDE 0.9 % IV SOLN
14.2000 mg/kg | Freq: Once | INTRAVENOUS | Status: AC
  Administered 2019-01-01: 1300 mg via INTRAVENOUS
  Filled 2019-01-01: qty 48

## 2019-01-01 MED ORDER — HEPARIN SOD (PORK) LOCK FLUSH 100 UNIT/ML IV SOLN
500.0000 [IU] | Freq: Once | INTRAVENOUS | Status: AC | PRN
  Administered 2019-01-01: 500 [IU]
  Filled 2019-01-01: qty 5

## 2019-01-01 MED ORDER — SODIUM CHLORIDE 0.9% FLUSH
10.0000 mL | INTRAVENOUS | Status: DC | PRN
  Administered 2019-01-01: 10 mL
  Filled 2019-01-01: qty 10

## 2019-01-01 NOTE — Patient Instructions (Signed)
Implanted Port Insertion, Care After This sheet gives you information about how to care for yourself after your procedure. Your health care provider may also give you more specific instructions. If you have problems or questions, contact your health care provider. What can I expect after the procedure? After the procedure, it is common to have:  Discomfort at the port insertion site.  Bruising on the skin over the port. This should improve over 3-4 days. Follow these instructions at home: Port care  After your port is placed, you will get a manufacturer's information card. The card has information about your port. Keep this card with you at all times.  Take care of the port as told by your health care provider. Ask your health care provider if you or a family member can get training for taking care of the port at home. A home health care nurse may also take care of the port.  Make sure to remember what type of port you have. Incision care      Follow instructions from your health care provider about how to take care of your port insertion site. Make sure you: ? Wash your hands with soap and water before and after you change your bandage (dressing). If soap and water are not available, use hand sanitizer. ? Change your dressing as told by your health care provider. ? Leave stitches (sutures), skin glue, or adhesive strips in place. These skin closures may need to stay in place for 2 weeks or longer. If adhesive strip edges start to loosen and curl up, you may trim the loose edges. Do not remove adhesive strips completely unless your health care provider tells you to do that.  Check your port insertion site every day for signs of infection. Check for: ? Redness, swelling, or pain. ? Fluid or blood. ? Warmth. ? Pus or a bad smell. Activity  Return to your normal activities as told by your health care provider. Ask your health care provider what activities are safe for you.  Do not  lift anything that is heavier than 10 lb (4.5 kg), or the limit that you are told, until your health care provider says that it is safe. General instructions  Take over-the-counter and prescription medicines only as told by your health care provider.  Do not take baths, swim, or use a hot tub until your health care provider approves. Ask your health care provider if you may take showers. You may only be allowed to take sponge baths.  Do not drive for 24 hours if you were given a sedative during your procedure.  Wear a medical alert bracelet in case of an emergency. This will tell any health care providers that you have a port.  Keep all follow-up visits as told by your health care provider. This is important. Contact a health care provider if:  You cannot flush your port with saline as directed, or you cannot draw blood from the port.  You have a fever or chills.  You have redness, swelling, or pain around your port insertion site.  You have fluid or blood coming from your port insertion site.  Your port insertion site feels warm to the touch.  You have pus or a bad smell coming from the port insertion site. Get help right away if:  You have chest pain or shortness of breath.  You have bleeding from your port that you cannot control. Summary  Take care of the port as told by your health   care provider. Keep the manufacturer's information card with you at all times.  Change your dressing as told by your health care provider.  Contact a health care provider if you have a fever or chills or if you have redness, swelling, or pain around your port insertion site.  Keep all follow-up visits as told by your health care provider. This information is not intended to replace advice given to you by your health care provider. Make sure you discuss any questions you have with your health care provider. Document Released: 03/06/2013 Document Revised: 12/12/2017 Document Reviewed: 12/12/2017  Elsevier Patient Education  2020 Elsevier Inc[employer]  

## 2019-01-01 NOTE — Patient Instructions (Signed)
Bevacizumab injection What is this medicine? BEVACIZUMAB (be va SIZ yoo mab) is a monoclonal antibody. It is used to treat many types of cancer. This medicine may be used for other purposes; ask your health care provider or pharmacist if you have questions. COMMON BRAND NAME(S): Avastin, MVASI, Zirabev What should I tell my health care provider before I take this medicine? They need to know if you have any of these conditions:  diabetes  heart disease  high blood pressure  history of coughing up blood  prior anthracycline chemotherapy (e.g., doxorubicin, daunorubicin, epirubicin)  recent or ongoing radiation therapy  recent or planning to have surgery  stroke  an unusual or allergic reaction to bevacizumab, hamster proteins, mouse proteins, other medicines, foods, dyes, or preservatives  pregnant or trying to get pregnant  breast-feeding How should I use this medicine? This medicine is for infusion into a vein. It is given by a health care professional in a hospital or clinic setting. Talk to your pediatrician regarding the use of this medicine in children. Special care may be needed. Overdosage: If you think you have taken too much of this medicine contact a poison control center or emergency room at once. NOTE: This medicine is only for you. Do not share this medicine with others. What if I miss a dose? It is important not to miss your dose. Call your doctor or health care professional if you are unable to keep an appointment. What may interact with this medicine? Interactions are not expected. This list may not describe all possible interactions. Give your health care provider a list of all the medicines, herbs, non-prescription drugs, or dietary supplements you use. Also tell them if you smoke, drink alcohol, or use illegal drugs. Some items may interact with your medicine. What should I watch for while using this medicine? Your condition will be monitored carefully while  you are receiving this medicine. You will need important blood work and urine testing done while you are taking this medicine. This medicine may increase your risk to bruise or bleed. Call your doctor or health care professional if you notice any unusual bleeding. This medicine should be started at least 28 days following major surgery and the site of the surgery should be totally healed. Check with your doctor before scheduling dental work or surgery while you are receiving this treatment. Talk to your doctor if you have recently had surgery or if you have a wound that has not healed. Do not become pregnant while taking this medicine or for 6 months after stopping it. Women should inform their doctor if they wish to become pregnant or think they might be pregnant. There is a potential for serious side effects to an unborn child. Talk to your health care professional or pharmacist for more information. Do not breast-feed an infant while taking this medicine and for 6 months after the last dose. This medicine has caused ovarian failure in some women. This medicine may interfere with the ability to have a child. You should talk to your doctor or health care professional if you are concerned about your fertility. What side effects may I notice from receiving this medicine? Side effects that you should report to your doctor or health care professional as soon as possible:  allergic reactions like skin rash, itching or hives, swelling of the face, lips, or tongue  chest pain or chest tightness  chills  coughing up blood  high fever  seizures  severe constipation  signs and symptoms   of bleeding such as bloody or black, tarry stools; red or dark-brown urine; spitting up blood or brown material that looks like coffee grounds; red spots on the skin; unusual bruising or bleeding from the eye, gums, or nose  signs and symptoms of a blood clot such as breathing problems; chest pain; severe, sudden  headache; pain, swelling, warmth in the leg  signs and symptoms of a stroke like changes in vision; confusion; trouble speaking or understanding; severe headaches; sudden numbness or weakness of the face, arm or leg; trouble walking; dizziness; loss of balance or coordination  stomach pain  sweating  swelling of legs or ankles  vomiting  weight gain Side effects that usually do not require medical attention (report to your doctor or health care professional if they continue or are bothersome):  back pain  changes in taste  decreased appetite  dry skin  nausea  tiredness This list may not describe all possible side effects. Call your doctor for medical advice about side effects. You may report side effects to FDA at 1-800-FDA-1088. Where should I keep my medicine? This drug is given in a hospital or clinic and will not be stored at home. NOTE: This sheet is a summary. It may not cover all possible information. If you have questions about this medicine, talk to your doctor, pharmacist, or health care provider.  2020 Elsevier/Gold Standard (2016-05-13 14:33:29)  
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
## 2019-01-16 NOTE — Progress Notes (Signed)
Okay to change Avastin to [facility] with next treatment per Dr. Marin Olp.

## 2019-01-22 ENCOUNTER — Inpatient Hospital Stay: Payer: Medicare Other

## 2019-01-22 ENCOUNTER — Other Ambulatory Visit: Payer: Self-pay

## 2019-01-22 ENCOUNTER — Inpatient Hospital Stay (HOSPITAL_BASED_OUTPATIENT_CLINIC_OR_DEPARTMENT_OTHER): Payer: Medicare Other | Admitting: Hematology & Oncology

## 2019-01-22 VITALS — BP 160/81 | HR 70 | Temp 97.5°F | Resp 19 | Wt 207.5 lb

## 2019-01-22 DIAGNOSIS — C186 Malignant neoplasm of descending colon: Secondary | ICD-10-CM

## 2019-01-22 DIAGNOSIS — C787 Secondary malignant neoplasm of liver and intrahepatic bile duct: Secondary | ICD-10-CM

## 2019-01-22 DIAGNOSIS — Z5112 Encounter for antineoplastic immunotherapy: Secondary | ICD-10-CM | POA: Diagnosis not present

## 2019-01-22 DIAGNOSIS — C189 Malignant neoplasm of colon, unspecified: Secondary | ICD-10-CM

## 2019-01-22 LAB — CBC WITH DIFFERENTIAL (CANCER CENTER ONLY)
Abs Immature Granulocytes: 0.08 10*3/uL — ABNORMAL HIGH (ref 0.00–0.07)
Basophils Absolute: 0.1 10*3/uL (ref 0.0–0.1)
Basophils Relative: 1 %
Eosinophils Absolute: 0.6 10*3/uL — ABNORMAL HIGH (ref 0.0–0.5)
Eosinophils Relative: 6 %
HCT: 34.1 % — ABNORMAL LOW (ref 36.0–46.0)
Hemoglobin: 11.5 g/dL — ABNORMAL LOW (ref 12.0–15.0)
Immature Granulocytes: 1 %
Lymphocytes Relative: 19 %
Lymphs Abs: 1.8 10*3/uL (ref 0.7–4.0)
MCH: 31.9 pg (ref 26.0–34.0)
MCHC: 33.7 g/dL (ref 30.0–36.0)
MCV: 94.7 fL (ref 80.0–100.0)
Monocytes Absolute: 1.7 10*3/uL — ABNORMAL HIGH (ref 0.1–1.0)
Monocytes Relative: 18 %
Neutro Abs: 5.3 10*3/uL (ref 1.7–7.7)
Neutrophils Relative %: 55 %
Platelet Count: 307 10*3/uL (ref 150–400)
RBC: 3.6 MIL/uL — ABNORMAL LOW (ref 3.87–5.11)
RDW: 25.2 % — ABNORMAL HIGH (ref 11.5–15.5)
WBC Count: 9.5 10*3/uL (ref 4.0–10.5)
nRBC: 0.4 % — ABNORMAL HIGH (ref 0.0–0.2)

## 2019-01-22 LAB — CMP (CANCER CENTER ONLY)
ALT: 7 U/L (ref 0–44)
AST: 25 U/L (ref 15–41)
Albumin: 3.6 g/dL (ref 3.5–5.0)
Alkaline Phosphatase: 145 U/L — ABNORMAL HIGH (ref 38–126)
Anion gap: 7 (ref 5–15)
BUN: 23 mg/dL (ref 8–23)
CO2: 23 mmol/L (ref 22–32)
Calcium: 9 mg/dL (ref 8.9–10.3)
Chloride: 109 mmol/L (ref 98–111)
Creatinine: 1.42 mg/dL — ABNORMAL HIGH (ref 0.44–1.00)
GFR, Est AFR Am: 40 mL/min — ABNORMAL LOW (ref >60)
GFR, Estimated: 34 mL/min — ABNORMAL LOW (ref >60)
Glucose, Bld: 86 mg/dL (ref 70–99)
Potassium: 4 mmol/L (ref 3.5–5.1)
Sodium: 139 mmol/L (ref 135–145)
Total Bilirubin: 0.4 mg/dL (ref 0.3–1.2)
Total Protein: 7.7 g/dL (ref 6.5–8.1)

## 2019-01-22 MED ORDER — SODIUM CHLORIDE 0.9 % IV SOLN
15.2000 mg/kg | INTRAVENOUS | Status: DC
  Administered 2019-01-22: 1400 mg via INTRAVENOUS
  Filled 2019-01-22: qty 48

## 2019-01-22 MED ORDER — SODIUM CHLORIDE 0.9% FLUSH
10.0000 mL | INTRAVENOUS | Status: DC | PRN
  Administered 2019-01-22: 10 mL
  Filled 2019-01-22: qty 10

## 2019-01-22 MED ORDER — SODIUM CHLORIDE 0.9 % IV SOLN: 15.0000 mg/kg | INTRAVENOUS | Status: DC

## 2019-01-22 MED ORDER — SODIUM CHLORIDE 0.9 % IV SOLN
Freq: Once | INTRAVENOUS | Status: AC
  Administered 2019-01-22: 12:00:00 via INTRAVENOUS
  Filled 2019-01-22: qty 250

## 2019-01-22 MED ORDER — HEPARIN SOD (PORK) LOCK FLUSH 100 UNIT/ML IV SOLN
500.0000 [IU] | Freq: Once | INTRAVENOUS | Status: AC | PRN
  Administered 2019-01-22: 500 [IU]
  Filled 2019-01-22: qty 5

## 2019-01-22 MED ORDER — SODIUM CHLORIDE 0.9 % IV SOLN: 15.0000 mg/kg | Freq: Once | INTRAVENOUS | Status: DC

## 2019-01-22 MED ORDER — MELOXICAM 7.5 MG PO TABS: 7.5000 mg | ORAL_TABLET | Freq: Every day | ORAL | 3 refills | Status: DC

## 2019-01-22 MED FILL — XELODA 500 MG TABLET: 500 | 21 days supply | Qty: 84 | Fill #3

## 2019-01-22 NOTE — Patient Instructions (Signed)
Bevacizumab injection What is this medicine? BEVACIZUMAB (be va SIZ yoo mab) is a monoclonal antibody. It is used to treat many types of cancer. This medicine may be used for other purposes; ask your health care provider or pharmacist if you have questions. COMMON BRAND NAME(S): Avastin, MVASI, Zirabev What should I tell my health care provider before I take this medicine? They need to know if you have any of these conditions:  diabetes  heart disease  high blood pressure  history of coughing up blood  prior anthracycline chemotherapy (e.g., doxorubicin, daunorubicin, epirubicin)  recent or ongoing radiation therapy  recent or planning to have surgery  stroke  an unusual or allergic reaction to bevacizumab, hamster proteins, mouse proteins, other medicines, foods, dyes, or preservatives  pregnant or trying to get pregnant  breast-feeding How should I use this medicine? This medicine is for infusion into a vein. It is given by a health care professional in a hospital or clinic setting. Talk to your pediatrician regarding the use of this medicine in children. Special care may be needed. Overdosage: If you think you have taken too much of this medicine contact a poison control center or emergency room at once. NOTE: This medicine is only for you. Do not share this medicine with others. What if I miss a dose? It is important not to miss your dose. Call your doctor or health care professional if you are unable to keep an appointment. What may interact with this medicine? Interactions are not expected. This list may not describe all possible interactions. Give your health care provider a list of all the medicines, herbs, non-prescription drugs, or dietary supplements you use. Also tell them if you smoke, drink alcohol, or use illegal drugs. Some items may interact with your medicine. What should I watch for while using this medicine? Your condition will be monitored carefully while  you are receiving this medicine. You will need important blood work and urine testing done while you are taking this medicine. This medicine may increase your risk to bruise or bleed. Call your doctor or health care professional if you notice any unusual bleeding. This medicine should be started at least 28 days following major surgery and the site of the surgery should be totally healed. Check with your doctor before scheduling dental work or surgery while you are receiving this treatment. Talk to your doctor if you have recently had surgery or if you have a wound that has not healed. Do not become pregnant while taking this medicine or for 6 months after stopping it. Women should inform their doctor if they wish to become pregnant or think they might be pregnant. There is a potential for serious side effects to an unborn child. Talk to your health care professional or pharmacist for more information. Do not breast-feed an infant while taking this medicine and for 6 months after the last dose. This medicine has caused ovarian failure in some women. This medicine may interfere with the ability to have a child. You should talk to your doctor or health care professional if you are concerned about your fertility. What side effects may I notice from receiving this medicine? Side effects that you should report to your doctor or health care professional as soon as possible:  allergic reactions like skin rash, itching or hives, swelling of the face, lips, or tongue  chest pain or chest tightness  chills  coughing up blood  high fever  seizures  severe constipation  signs and symptoms   of bleeding such as bloody or black, tarry stools; red or dark-brown urine; spitting up blood or brown material that looks like coffee grounds; red spots on the skin; unusual bruising or bleeding from the eye, gums, or nose  signs and symptoms of a blood clot such as breathing problems; chest pain; severe, sudden  headache; pain, swelling, warmth in the leg  signs and symptoms of a stroke like changes in vision; confusion; trouble speaking or understanding; severe headaches; sudden numbness or weakness of the face, arm or leg; trouble walking; dizziness; loss of balance or coordination  stomach pain  sweating  swelling of legs or ankles  vomiting  weight gain Side effects that usually do not require medical attention (report to your doctor or health care professional if they continue or are bothersome):  back pain  changes in taste  decreased appetite  dry skin  nausea  tiredness This list may not describe all possible side effects. Call your doctor for medical advice about side effects. You may report side effects to FDA at 1-800-FDA-1088. Where should I keep my medicine? This drug is given in a hospital or clinic and will not be stored at home. NOTE: This sheet is a summary. It may not cover all possible information. If you have questions about this medicine, talk to your doctor, pharmacist, or health care provider.  2020 Elsevier/Gold Standard (2016-05-13 14:33:29)  

## 2019-01-22 NOTE — Patient Instructions (Signed)
Pembrolizumab injection What is this medicine? PEMBROLIZUMAB (pem broe liz ue mab) is a monoclonal antibody. It is used to treat bladder cancer, cervical cancer, endometrial cancer, esophageal cancer, head and neck cancer, hepatocellular cancer, Hodgkin lymphoma, kidney cancer, lymphoma, melanoma, Merkel cell carcinoma, lung cancer, stomach cancer, urothelial cancer, and cancers that have a certain genetic condition. This medicine may be used for other purposes; ask your health care provider or pharmacist if you have questions. COMMON BRAND NAME(S): Keytruda What should I tell my health care provider before I take this medicine? They need to know if you have any of these conditions:  diabetes  immune system problems  inflammatory bowel disease  liver disease  lung or breathing disease  lupus  received or scheduled to receive an organ transplant or a stem-cell transplant that uses donor stem cells  an unusual or allergic reaction to pembrolizumab, other medicines, foods, dyes, or preservatives  pregnant or trying to get pregnant  breast-feeding How should I use this medicine? This medicine is for infusion into a vein. It is given by a health care professional in a hospital or clinic setting. A special MedGuide will be given to you before each treatment. Be sure to read this information carefully each time. Talk to your pediatrician regarding the use of this medicine in children. While this drug may be prescribed for selected conditions, precautions do apply. Overdosage: If you think you have taken too much of this medicine contact a poison control center or emergency room at once. NOTE: This medicine is only for you. Do not share this medicine with others. What if I miss a dose? It is important not to miss your dose. Call your doctor or health care professional if you are unable to keep an appointment. What may interact with this medicine? Interactions have not been studied. Give  your health care provider a list of all the medicines, herbs, non-prescription drugs, or dietary supplements you use. Also tell them if you smoke, drink alcohol, or use illegal drugs. Some items may interact with your medicine. This list may not describe all possible interactions. Give your health care provider a list of all the medicines, herbs, non-prescription drugs, or dietary supplements you use. Also tell them if you smoke, drink alcohol, or use illegal drugs. Some items may interact with your medicine. What should I watch for while using this medicine? Your condition will be monitored carefully while you are receiving this medicine. You may need blood work done while you are taking this medicine. Do not become pregnant while taking this medicine or for 4 months after stopping it. Women should inform their doctor if they wish to become pregnant or think they might be pregnant. There is a potential for serious side effects to an unborn child. Talk to your health care professional or pharmacist for more information. Do not breast-feed an infant while taking this medicine or for 4 months after the last dose. What side effects may I notice from receiving this medicine? Side effects that you should report to your doctor or health care professional as soon as possible:  allergic reactions like skin rash, itching or hives, swelling of the face, lips, or tongue  bloody or black, tarry  breathing problems  changes in vision  chest pain  chills  confusion  constipation  cough  diarrhea  dizziness or feeling faint or lightheaded  fast or irregular heartbeat  fever  flushing  hair loss  joint pain  low blood counts - this   medicine may decrease the number of white blood cells, red blood cells and platelets. You may be at increased risk for infections and bleeding.  muscle pain  muscle weakness  persistent headache  redness, blistering, peeling or loosening of the skin,  including inside the mouth  signs and symptoms of high blood sugar such as dizziness; dry mouth; dry skin; fruity breath; nausea; stomach pain; increased hunger or thirst; increased urination  signs and symptoms of kidney injury like trouble passing urine or change in the amount of urine  signs and symptoms of liver injury like dark urine, light-colored stools, loss of appetite, nausea, right upper belly pain, yellowing of the eyes or skin  sweating  swollen lymph nodes  weight loss Side effects that usually do not require medical attention (report to your doctor or health care professional if they continue or are bothersome):  decreased appetite  muscle pain  tiredness This list may not describe all possible side effects. Call your doctor for medical advice about side effects. You may report side effects to FDA at 1-800-FDA-1088. Where should I keep my medicine? This drug is given in a hospital or clinic and will not be stored at home. NOTE: This sheet is a summary. It may not cover all possible information. If you have questions about this medicine, talk to your doctor, pharmacist, or health care provider.  2020 Elsevier/Gold Standard (2018-06-12 13:46:58)  

## 2019-01-22 NOTE — Progress Notes (Signed)
Hematology and Oncology Follow Up Visit  Melanie [name] [id number] 1935-05-24 84 y.o. 01/22/2019   Principle Diagnosis:  Metastatic colon cancer -- progressive mets --  NO actionable mutation/LOW TMB/ MSI stable/ KRAS (+)/HER2-  Past Therapy:             FOLFOX q 14 days s/p cycle 3 - DC'd secondary to non-tolerance RFA surgery - January 2019  Current Therapy:   Xeloda 1500 mg po BID (14/7) -- s/p c#2 - start on 11/20/2018 Avastin 7.5 mg/kg IV q 3 week -- S/p c#2 -started on 06//23/2020   Interim History:  Ms. Melanie [name] is here today for follow-up and treatment.  Her main problem right now appears to be joint swelling in her hands.  I am not sure if this is from the Xeloda.  I actually told her to hold on the Xeloda for another week.  She is post to start on Wednesday.  I told her to hold off until September 2.  There is no pain with her fingers.  Some of the digits does look swollen.  She does have some decreased range of motion.  I will give her a dose of Toradol.  I will see how the Toradol works.  We may have to get her on some type of anti-inflammatory as an oral protocol.  Her CEA has come down quite nicely.  We last checked it in July it was down to 66.  She has had no abdominal pain.  She has had no diarrhea.  Today is her birthday.  I just feel bad that she has to be here for her birthday.  Overall, I would have to say that her performance status is ECOG 1.  She is having her blood pressure under better control.  Has had no nausea or vomiting.  She has had no leg swelling.  There is been no rashes.  Medications:  Allergies as of 01/22/2019   No Known Allergies     Medication List       Accurate as of January 22, 2019 11:15 AM. If you have any questions, ask your nurse or doctor.        amlodipine-atorvastatin 10-10 MG tablet Commonly known as: CADUET Take 1 tablet by mouth daily.   aspirin 81 MG chewable tablet Chew by mouth daily.   CALTRATE 600+D PO  Take 1 tablet by mouth at bedtime.   capecitabine 500 MG tablet Commonly known as: XELODA Take 3 tablets (1,500 mg total) by mouth 2 (two) times daily after a meal. Take for 14 days on and 7 days off.   ferrous sulfate 325 (65 FE) MG tablet Take 325 mg by mouth daily with breakfast.   levothyroxine 50 MCG tablet Commonly known as: SYNTHROID Take 1 tablet (50 mcg total) by mouth daily before breakfast.   lidocaine-prilocaine cream Commonly known as: EMLA APPLY 1 APPLICATION TOPICALLY AS NEEDED.   metoprolol tartrate 25 MG tablet Commonly known as: LOPRESSOR Take 50 mg by mouth 2 (two) times daily.   multivitamin with minerals Tabs tablet Take 1 tablet by mouth daily.   ondansetron 4 MG tablet Commonly known as: Zofran Take 1 tablet (4 mg total) by mouth every 8 (eight) hours as needed for nausea or vomiting.   potassium chloride SA 20 MEQ tablet Commonly known as: K-DUR Take 1 tablet (20 mEq total) by mouth daily.   VITAMIN B-12 PO Take 1 tablet by mouth daily.       Allergies: No Known Allergies  Past Medical History, Surgical history, Social history, and Family History were reviewed and updated.  Review of Systems: Review of Systems  Constitutional: Negative.   HENT: Negative.   Eyes: Negative.   Respiratory: Negative.   Cardiovascular: Negative.   Gastrointestinal: Negative.   Genitourinary: Negative.   Musculoskeletal: Negative.   Skin: Negative.   Neurological: Negative.   Endo/Heme/Allergies: Negative.   Psychiatric/Behavioral: Negative.      Physical Exam:  weight is 207 lb 8 oz (94.1 kg). Her temporal temperature is 97.5 F (36.4 C) (abnormal). Her blood pressure is 160/81 (abnormal) and her pulse is 70. Her respiration is 19 and oxygen saturation is 96%.   Wt Readings from Last 3 Encounters:  01/22/19 207 lb 8 oz (94.1 kg)  12/11/18 204 lb (92.5 kg)  11/20/18 200 lb (90.7 kg)    Physical Exam Vitals signs reviewed.  HENT:     Head:  Normocephalic and atraumatic.  Eyes:     Pupils: Pupils are equal, round, and reactive to light.  Neck:     Musculoskeletal: Normal range of motion.  Cardiovascular:     Rate and Rhythm: Normal rate and regular rhythm.     Heart sounds: Normal heart sounds.  Pulmonary:     Effort: Pulmonary effort is normal.     Breath sounds: Normal breath sounds.  Abdominal:     General: Bowel sounds are normal.     Palpations: Abdomen is soft.  Musculoskeletal: Normal range of motion.        General: No tenderness or deformity.  Lymphadenopathy:     Cervical: No cervical adenopathy.  Skin:    General: Skin is warm and dry.     Findings: No erythema or rash.  Neurological:     Mental Status: She is alert and oriented to person, place, and time.  Psychiatric:        Behavior: Behavior normal.        Thought Content: Thought content normal.        Judgment: Judgment normal.      Lab Results  Component Value Date   WBC 9.5 01/22/2019   HGB 11.5 (L) 01/22/2019   HCT 34.1 (L) 01/22/2019   MCV 94.7 01/22/2019   PLT 307 01/22/2019   Lab Results  Component Value Date   FERRITIN 840 (H) 01/01/2019   IRON 116 01/01/2019   TIBC 296 01/01/2019   UIBC 181 01/01/2019   IRONPCTSAT 39 01/01/2019   Lab Results  Component Value Date   RETICCTPCT 1.4 09/04/2018   RBC 3.60 (L) 01/22/2019   No results found for: KPAFRELGTCHN, LAMBDASER, KAPLAMBRATIO No results found for: IGGSERUM, IGA, IGMSERUM No results found for: Odetta Pink, SPEI   Chemistry      Component Value Date/Time   NA 139 01/22/2019 1040   NA 144 04/26/2017 0830   NA 137 09/30/2016 0940   K 4.0 01/22/2019 1040   K 4.1 04/26/2017 0830   K 4.7 09/30/2016 0940   CL 109 01/22/2019 1040   CL 108 04/26/2017 0830   CO2 23 01/22/2019 1040   CO2 27 04/26/2017 0830   CO2 18 (L) 09/30/2016 0940   BUN 23 01/22/2019 1040   BUN 25 (H) 04/26/2017 0830   BUN 21.5 09/30/2016 0940    CREATININE 1.42 (H) 01/22/2019 1040   CREATININE 1.5 (H) 04/26/2017 0830   CREATININE 1.8 (H) 09/30/2016 0940      Component Value Date/Time   CALCIUM 9.0 01/22/2019  1040   CALCIUM 9.9 04/26/2017 0830   CALCIUM 10.4 09/30/2016 0940   ALKPHOS 145 (H) 01/22/2019 1040   ALKPHOS 124 (H) 04/26/2017 0830   ALKPHOS 147 09/30/2016 0940   AST 25 01/22/2019 1040   AST 27 09/30/2016 0940   ALT 7 01/22/2019 1040   ALT 16 04/26/2017 0830   ALT 11 09/30/2016 0940   BILITOT 0.4 01/22/2019 1040   BILITOT 0.34 09/30/2016 0940       Impression and Plan: Ms. Licklider is a very pleasant 84 yo African American female with adenocarcinoma of the descending colon that has recurred and is now metastatic.   She started Xeloda and Avastin in June and has tolerated her treatment well with the exception of some mild nausea.  Hopefully, we will see that the swelling improves.  Hopefully the Toradol will help.  I may try her on some Mobic.  I think this would be fairly well tolerated.  Since her CEA is coming down, I am not sure if we had to really do scans on her right now.  I think the CEA certainly tells [place] how everything is going.  I would like to get her back in another 4 weeks.    [name], MD 8/25/202011:15 AM

## 2019-01-23 LAB — CEA (IN HOUSE-CHCC): CEA (CHCC-In House): 80.31 ng/mL — ABNORMAL HIGH (ref 0.00–5.00)

## 2019-01-24 MED FILL — XELODA 500 MG TABLET: 500 | 21 days supply | Qty: 84 | Fill #3

## 2019-02-12 ENCOUNTER — Ambulatory Visit: Payer: Medicare Other | Admitting: Hematology & Oncology

## 2019-02-12 ENCOUNTER — Other Ambulatory Visit: Payer: Medicare Other

## 2019-02-12 ENCOUNTER — Ambulatory Visit: Payer: Medicare Other

## 2019-02-18 ENCOUNTER — Other Ambulatory Visit: Payer: Self-pay | Admitting: *Deleted

## 2019-02-18 DIAGNOSIS — C186 Malignant neoplasm of descending colon: Secondary | ICD-10-CM

## 2019-02-18 DIAGNOSIS — C189 Malignant neoplasm of colon, unspecified: Secondary | ICD-10-CM

## 2019-02-18 DIAGNOSIS — D500 Iron deficiency anemia secondary to blood loss (chronic): Secondary | ICD-10-CM

## 2019-02-18 DIAGNOSIS — C787 Secondary malignant neoplasm of liver and intrahepatic bile duct: Secondary | ICD-10-CM

## 2019-02-18 MED FILL — XELODA 500 MG TABLET: 500 | 21 days supply | Qty: 84 | Fill #4

## 2019-02-19 ENCOUNTER — Encounter: Payer: Self-pay | Admitting: Hematology & Oncology

## 2019-02-19 ENCOUNTER — Inpatient Hospital Stay: Payer: Medicare Other

## 2019-02-19 ENCOUNTER — Other Ambulatory Visit: Payer: Self-pay

## 2019-02-19 ENCOUNTER — Inpatient Hospital Stay: Payer: Medicare Other | Attending: Hematology & Oncology | Admitting: Hematology & Oncology

## 2019-02-19 VITALS — BP 148/65 | HR 58 | Temp 97.3°F | Wt 205.0 lb

## 2019-02-19 DIAGNOSIS — C189 Malignant neoplasm of colon, unspecified: Secondary | ICD-10-CM

## 2019-02-19 DIAGNOSIS — D500 Iron deficiency anemia secondary to blood loss (chronic): Secondary | ICD-10-CM

## 2019-02-19 DIAGNOSIS — Z5112 Encounter for antineoplastic immunotherapy: Secondary | ICD-10-CM | POA: Insufficient documentation

## 2019-02-19 DIAGNOSIS — C787 Secondary malignant neoplasm of liver and intrahepatic bile duct: Secondary | ICD-10-CM | POA: Diagnosis present

## 2019-02-19 DIAGNOSIS — C186 Malignant neoplasm of descending colon: Secondary | ICD-10-CM | POA: Insufficient documentation

## 2019-02-19 DIAGNOSIS — R110 Nausea: Secondary | ICD-10-CM | POA: Insufficient documentation

## 2019-02-19 LAB — CMP (CANCER CENTER ONLY)
ALT: 6 U/L (ref 0–44)
AST: 24 U/L (ref 15–41)
Albumin: 3.9 g/dL (ref 3.5–5.0)
Alkaline Phosphatase: 152 U/L — ABNORMAL HIGH (ref 38–126)
Anion gap: 6 (ref 5–15)
BUN: 27 mg/dL — ABNORMAL HIGH (ref 8–23)
CO2: 24 mmol/L (ref 22–32)
Calcium: 9.8 mg/dL (ref 8.9–10.3)
Chloride: 107 mmol/L (ref 98–111)
Creatinine: 1.63 mg/dL — ABNORMAL HIGH (ref 0.44–1.00)
GFR, Est AFR Am: 34 mL/min — ABNORMAL LOW (ref >60)
GFR, Estimated: 29 mL/min — ABNORMAL LOW (ref >60)
Glucose, Bld: 124 mg/dL — ABNORMAL HIGH (ref 70–99)
Potassium: 3.9 mmol/L (ref 3.5–5.1)
Sodium: 137 mmol/L (ref 135–145)
Total Bilirubin: 0.5 mg/dL (ref 0.3–1.2)
Total Protein: 8 g/dL (ref 6.5–8.1)

## 2019-02-19 LAB — CBC WITH DIFFERENTIAL (CANCER CENTER ONLY)
Abs Immature Granulocytes: 0.18 10*3/uL — ABNORMAL HIGH (ref 0.00–0.07)
Basophils Absolute: 0.1 10*3/uL (ref 0.0–0.1)
Basophils Relative: 1 %
Eosinophils Absolute: 0.5 10*3/uL (ref 0.0–0.5)
Eosinophils Relative: 5 %
HCT: 35 % — ABNORMAL LOW (ref 36.0–46.0)
Hemoglobin: 11.9 g/dL — ABNORMAL LOW (ref 12.0–15.0)
Immature Granulocytes: 2 %
Lymphocytes Relative: 16 %
Lymphs Abs: 1.6 10*3/uL (ref 0.7–4.0)
MCH: 33.2 pg (ref 26.0–34.0)
MCHC: 34 g/dL (ref 30.0–36.0)
MCV: 97.8 fL (ref 80.0–100.0)
Monocytes Absolute: 1.6 10*3/uL — ABNORMAL HIGH (ref 0.1–1.0)
Monocytes Relative: 16 %
Neutro Abs: 6.2 10*3/uL (ref 1.7–7.7)
Neutrophils Relative %: 60 %
Platelet Count: 296 10*3/uL (ref 150–400)
RBC: 3.58 MIL/uL — ABNORMAL LOW (ref 3.87–5.11)
RDW: 22.6 % — ABNORMAL HIGH (ref 11.5–15.5)
WBC Count: 10.1 10*3/uL (ref 4.0–10.5)
nRBC: 0.4 % — ABNORMAL HIGH (ref 0.0–0.2)

## 2019-02-19 LAB — TOTAL PROTEIN, URINE DIPSTICK

## 2019-02-19 MED ORDER — SODIUM CHLORIDE 0.9 % IV SOLN
15.2000 mg/kg | INTRAVENOUS | Status: DC
  Administered 2019-02-19: 1400 mg via INTRAVENOUS
  Filled 2019-02-19: qty 8

## 2019-02-19 MED ORDER — SODIUM CHLORIDE 0.9 % IV SOLN
Freq: Once | INTRAVENOUS | Status: AC
  Administered 2019-02-19: 13:00:00 via INTRAVENOUS
  Filled 2019-02-19: qty 250

## 2019-02-19 MED ORDER — HEPARIN SOD (PORK) LOCK FLUSH 100 UNIT/ML IV SOLN
500.0000 [IU] | Freq: Once | INTRAVENOUS | Status: AC | PRN
  Administered 2019-02-19: 500 [IU]
  Filled 2019-02-19: qty 5

## 2019-02-19 NOTE — Patient Instructions (Signed)
[facility] Discharge Instructions for Patients Receiving Chemotherapy  Today you received the following chemotherapy agents Avastin   To help prevent nausea and vomiting after your treatment, we encourage you to take your nausea medication as directed.    If you develop nausea and vomiting that is not controlled by your nausea medication, call the clinic.   BELOW ARE SYMPTOMS THAT SHOULD BE REPORTED IMMEDIATELY:  *FEVER GREATER THAN 100.5 F  *CHILLS WITH OR WITHOUT FEVER  NAUSEA AND VOMITING THAT IS NOT CONTROLLED WITH YOUR NAUSEA MEDICATION  *UNUSUAL SHORTNESS OF BREATH  *UNUSUAL BRUISING OR BLEEDING  TENDERNESS IN MOUTH AND THROAT WITH OR WITHOUT PRESENCE OF ULCERS  *URINARY PROBLEMS  *BOWEL PROBLEMS  UNUSUAL RASH Items with * indicate a potential emergency and should be followed up as soon as possible.  Feel free to call the clinic you have any questions or concerns. The clinic phone number is [phone].  Please show the CHEMO ALERT CARD at check-in to the Emergency Department and triage nurse.   

## 2019-02-19 NOTE — Patient Instructions (Signed)

## 2019-02-19 NOTE — Progress Notes (Signed)
Ok to treat with labs and creatinine today per MD Marin Olp

## 2019-02-19 NOTE — Progress Notes (Signed)
Hematology and Oncology Follow Up Visit  Melanie [name] [id number] November 06, 1934 84 y.o. 02/19/2019   Principle Diagnosis:  Metastatic colon cancer -- progressive mets --  NO actionable mutation/LOW TMB/ MSI stable/ KRAS (+)/HER2-  Past Therapy:             FOLFOX q 14 days s/p cycle 3 - DC'd secondary to non-tolerance RFA surgery - January 2019  Current Therapy:   Xeloda 1500 mg po BID (14/7) -- s/p c#4- start on 11/20/2018 Avastin 7.5 mg/kg IV q 3 week -- S/p c#4 -started on 06//23/2020   Interim History:  Melanie [name] is here today for follow-up and treatment.  I think that we might be having an issue.  Unfortunately, we last checked her CEA level in late August, it went from 66 up to 80.  I am quite disappointed in this.  She is taking the Xeloda without any problems.  She will start her fifth cycle on Thursday.  If we find that her CEA level is higher, then we will probably have to get scans on her and see exactly what is going on.  She feels well.  She has had no abdominal pain.  She has had no change in bowel or bladder habits.  She has had no issues with bleeding.  There is been no leg swelling.  Her blood pressure has been doing quite well.  She did go back home to [facility], [place] recently.  She had a wonderful time.  She was with her family which she enjoyed.  Her appetite has been quite good.  I do not think she is really lost any weight.  Overall, I would have to say that her performance status is ECOG 1.   Medications:  Allergies as of 02/19/2019   No Known Allergies     Medication List       Accurate as of February 19, 2019 12:52 PM. If you have any questions, ask your nurse or doctor.        amlodipine-atorvastatin 10-10 MG tablet Commonly known as: CADUET Take 1 tablet by mouth daily.   aspirin 81 MG chewable tablet Chew by mouth daily.   CALTRATE 600+D PO Take 1 tablet by mouth at bedtime.   capecitabine 500 MG tablet Commonly  known as: XELODA Take 3 tablets (1,500 mg total) by mouth 2 (two) times daily after a meal. Take for 14 days on and 7 days off.   ferrous sulfate 325 (65 FE) MG tablet Take 325 mg by mouth daily with breakfast.   levothyroxine 50 MCG tablet Commonly known as: SYNTHROID Take 1 tablet (50 mcg total) by mouth daily before breakfast.   lidocaine-prilocaine cream Commonly known as: EMLA APPLY 1 APPLICATION TOPICALLY AS NEEDED.   meloxicam 7.5 MG tablet Commonly known as: Mobic Take 1 tablet (7.5 mg total) by mouth daily. You MUST take with food!!   metoprolol tartrate 25 MG tablet Commonly known as: LOPRESSOR Take 50 mg by mouth 2 (two) times daily.   multivitamin with minerals Tabs tablet Take 1 tablet by mouth daily.   ondansetron 4 MG tablet Commonly known as: Zofran Take 1 tablet (4 mg total) by mouth every 8 (eight) hours as needed for nausea or vomiting.   potassium chloride SA 20 MEQ tablet Commonly known as: K-DUR Take 1 tablet (20 mEq total) by mouth daily.   VITAMIN B-12 PO Take 1 tablet by mouth daily.       Allergies: No Known Allergies  Past  Medical History, Surgical history, Social history, and Family History were reviewed and updated.  Review of Systems: Review of Systems  Constitutional: Negative.   HENT: Negative.   Eyes: Negative.   Respiratory: Negative.   Cardiovascular: Negative.   Gastrointestinal: Negative.   Genitourinary: Negative.   Musculoskeletal: Negative.   Skin: Negative.   Neurological: Negative.   Endo/Heme/Allergies: Negative.   Psychiatric/Behavioral: Negative.      Physical Exam:  weight is 205 lb (93 kg). Her oral temperature is 97.3 F (36.3 C) (abnormal). Her blood pressure is 148/65 (abnormal) and her pulse is 58 (abnormal). Her oxygen saturation is 98%.   Wt Readings from Last 3 Encounters:  02/19/19 205 lb (93 kg)  01/22/19 207 lb 8 oz (94.1 kg)  12/11/18 204 lb (92.5 kg)    Physical Exam Vitals signs  reviewed.  HENT:     Head: Normocephalic and atraumatic.  Eyes:     Pupils: Pupils are equal, round, and reactive to light.  Neck:     Musculoskeletal: Normal range of motion.  Cardiovascular:     Rate and Rhythm: Normal rate and regular rhythm.     Heart sounds: Normal heart sounds.  Pulmonary:     Effort: Pulmonary effort is normal.     Breath sounds: Normal breath sounds.  Abdominal:     General: Bowel sounds are normal.     Palpations: Abdomen is soft.  Musculoskeletal: Normal range of motion.        General: No tenderness or deformity.  Lymphadenopathy:     Cervical: No cervical adenopathy.  Skin:    General: Skin is warm and dry.     Findings: No erythema or rash.  Neurological:     Mental Status: She is alert and oriented to person, place, and time.  Psychiatric:        Behavior: Behavior normal.        Thought Content: Thought content normal.        Judgment: Judgment normal.      Lab Results  Component Value Date   WBC 10.1 02/19/2019   HGB 11.9 (L) 02/19/2019   HCT 35.0 (L) 02/19/2019   MCV 97.8 02/19/2019   PLT 296 02/19/2019   Lab Results  Component Value Date   FERRITIN 840 (H) 01/01/2019   IRON 116 01/01/2019   TIBC 296 01/01/2019   UIBC 181 01/01/2019   IRONPCTSAT 39 01/01/2019   Lab Results  Component Value Date   RETICCTPCT 1.4 09/04/2018   RBC 3.58 (L) 02/19/2019   No results found for: KPAFRELGTCHN, LAMBDASER, KAPLAMBRATIO No results found for: IGGSERUM, IGA, IGMSERUM No results found for: Odetta Pink, SPEI   Chemistry      Component Value Date/Time   NA 137 02/19/2019 1132   NA 144 04/26/2017 0830   NA 137 09/30/2016 0940   K 3.9 02/19/2019 1132   K 4.1 04/26/2017 0830   K 4.7 09/30/2016 0940   CL 107 02/19/2019 1132   CL 108 04/26/2017 0830   CO2 24 02/19/2019 1132   CO2 27 04/26/2017 0830   CO2 18 (L) 09/30/2016 0940   BUN 27 (H) 02/19/2019 1132   BUN 25 (H) 04/26/2017  0830   BUN 21.5 09/30/2016 0940   CREATININE 1.63 (H) 02/19/2019 1132   CREATININE 1.5 (H) 04/26/2017 0830   CREATININE 1.8 (H) 09/30/2016 0940      Component Value Date/Time   CALCIUM 9.8 02/19/2019 1132   CALCIUM 9.9  04/26/2017 0830   CALCIUM 10.4 09/30/2016 0940   ALKPHOS 152 (H) 02/19/2019 1132   ALKPHOS 124 (H) 04/26/2017 0830   ALKPHOS 147 09/30/2016 0940   AST 24 02/19/2019 1132   AST 27 09/30/2016 0940   ALT 6 02/19/2019 1132   ALT 16 04/26/2017 0830   ALT 11 09/30/2016 0940   BILITOT 0.5 02/19/2019 1132   BILITOT 0.34 09/30/2016 0940       Impression and Plan: Melanie [name] is a very pleasant 84 yo African American female with adenocarcinoma of the descending colon that has recurred and is now metastatic.   She started Xeloda and Avastin in June and has tolerated her treatment well with the exception of some mild nausea.  Again, I am quite worried about her CEA level.  We will see what the level is.  Again if it is elevated, we will half to get her set up with scans.  If we do find that she is progressing, we will be in a tough way.  I guess our next option might be Stivarga.  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  I will plan to see her back in another 4 weeks.    [name], MD 9/22/202012:52 PM

## 2019-02-20 LAB — CEA (IN HOUSE-CHCC): CEA (CHCC-In House): 106.26 ng/mL — ABNORMAL HIGH (ref 0.00–5.00)

## 2019-02-20 LAB — IRON AND TIBC
Iron: 81 ug/dL (ref 41–142)
Saturation Ratios: 29 % (ref 21–57)
TIBC: 279 ug/dL (ref 236–444)
UIBC: 197 ug/dL (ref 120–384)

## 2019-02-20 LAB — FERRITIN: Ferritin: 578 ng/mL — ABNORMAL HIGH (ref 11–307)

## 2019-02-22 ENCOUNTER — Telehealth: Payer: Self-pay | Admitting: *Deleted

## 2019-02-22 NOTE — Telephone Encounter (Signed)
Received a call from patients sister [name] stating that patient has had a lot of indigestion and belching in the past few days.  Patient has been uncomfortable at times.  Has taken Ondansetron as needed and has helped.  Reviewed with Dr. Marin Olp who states to add Pepto Bismol along with the Zofran and see if this doesn't help.  Sister appreciative of this call.

## 2019-03-05 ENCOUNTER — Ambulatory Visit: Payer: Medicare Other

## 2019-03-05 ENCOUNTER — Other Ambulatory Visit: Payer: Medicare Other

## 2019-03-05 ENCOUNTER — Ambulatory Visit: Payer: Medicare Other | Admitting: Hematology & Oncology

## 2019-03-07 ENCOUNTER — Other Ambulatory Visit: Payer: Self-pay | Admitting: Hematology & Oncology

## 2019-03-07 ENCOUNTER — Other Ambulatory Visit: Payer: Self-pay

## 2019-03-07 DIAGNOSIS — C186 Malignant neoplasm of descending colon: Secondary | ICD-10-CM

## 2019-03-11 ENCOUNTER — Other Ambulatory Visit: Payer: Self-pay | Admitting: Hematology & Oncology

## 2019-03-11 DIAGNOSIS — C186 Malignant neoplasm of descending colon: Secondary | ICD-10-CM

## 2019-03-12 ENCOUNTER — Ambulatory Visit: Payer: Medicare Other

## 2019-03-12 ENCOUNTER — Ambulatory Visit: Payer: Medicare Other | Admitting: Hematology & Oncology

## 2019-03-12 ENCOUNTER — Other Ambulatory Visit: Payer: Medicare Other

## 2019-03-12 MED FILL — XELODA 500 MG TABLET: 500 | 21 days supply | Qty: 84 | Fill #0

## 2019-03-16 ENCOUNTER — Other Ambulatory Visit: Payer: Self-pay | Admitting: Hematology & Oncology

## 2019-03-16 DIAGNOSIS — C189 Malignant neoplasm of colon, unspecified: Secondary | ICD-10-CM

## 2019-03-16 DIAGNOSIS — Z7189 Other specified counseling: Secondary | ICD-10-CM

## 2019-03-16 DIAGNOSIS — C787 Secondary malignant neoplasm of liver and intrahepatic bile duct: Secondary | ICD-10-CM

## 2019-03-18 ENCOUNTER — Telehealth: Payer: Self-pay | Admitting: Hematology & Oncology

## 2019-03-18 NOTE — Telephone Encounter (Signed)
Appts for 10/20 have been rescheduled per 10/19 sch msg and patient has been notified

## 2019-03-19 ENCOUNTER — Ambulatory Visit: Payer: Medicare Other

## 2019-03-19 ENCOUNTER — Other Ambulatory Visit: Payer: Medicare Other

## 2019-03-19 ENCOUNTER — Ambulatory Visit: Payer: Medicare Other | Admitting: Family

## 2019-03-26 ENCOUNTER — Inpatient Hospital Stay (HOSPITAL_BASED_OUTPATIENT_CLINIC_OR_DEPARTMENT_OTHER): Payer: Medicare Other | Admitting: Hematology & Oncology

## 2019-03-26 ENCOUNTER — Ambulatory Visit (HOSPITAL_BASED_OUTPATIENT_CLINIC_OR_DEPARTMENT_OTHER)
Admission: RE | Admit: 2019-03-26 | Discharge: 2019-03-26 | Disposition: A | Discharge status: Home / Self Care | Payer: Medicare Other | Source: Ambulatory Visit | Attending: Hematology & Oncology | Admitting: Hematology & Oncology

## 2019-03-26 ENCOUNTER — Inpatient Hospital Stay: Payer: Medicare Other | Attending: Hematology & Oncology

## 2019-03-26 ENCOUNTER — Telehealth: Payer: Self-pay | Admitting: Pharmacist

## 2019-03-26 ENCOUNTER — Other Ambulatory Visit: Payer: Medicare Other

## 2019-03-26 ENCOUNTER — Inpatient Hospital Stay: Payer: Medicare Other

## 2019-03-26 ENCOUNTER — Other Ambulatory Visit: Payer: Self-pay

## 2019-03-26 ENCOUNTER — Ambulatory Visit: Payer: Medicare Other | Admitting: Hematology & Oncology

## 2019-03-26 ENCOUNTER — Ambulatory Visit: Payer: Medicare Other

## 2019-03-26 ENCOUNTER — Encounter: Payer: Self-pay | Admitting: Hematology & Oncology

## 2019-03-26 VITALS — BP 182/88 | HR 66 | Temp 97.1°F | Resp 20 | Wt 202.0 lb

## 2019-03-26 DIAGNOSIS — R918 Other nonspecific abnormal finding of lung field: Secondary | ICD-10-CM | POA: Diagnosis not present

## 2019-03-26 DIAGNOSIS — C189 Malignant neoplasm of colon, unspecified: Secondary | ICD-10-CM | POA: Diagnosis present

## 2019-03-26 DIAGNOSIS — C787 Secondary malignant neoplasm of liver and intrahepatic bile duct: Secondary | ICD-10-CM

## 2019-03-26 DIAGNOSIS — C186 Malignant neoplasm of descending colon: Secondary | ICD-10-CM | POA: Insufficient documentation

## 2019-03-26 DIAGNOSIS — Z79899 Other long term (current) drug therapy: Secondary | ICD-10-CM | POA: Diagnosis not present

## 2019-03-26 DIAGNOSIS — R590 Localized enlarged lymph nodes: Secondary | ICD-10-CM | POA: Insufficient documentation

## 2019-03-26 DIAGNOSIS — J984 Other disorders of lung: Secondary | ICD-10-CM | POA: Diagnosis not present

## 2019-03-26 LAB — CMP (CANCER CENTER ONLY)
ALT: 5 U/L (ref 0–44)
AST: 22 U/L (ref 15–41)
Albumin: 4 g/dL (ref 3.5–5.0)
Alkaline Phosphatase: 128 U/L — ABNORMAL HIGH (ref 38–126)
Anion gap: 8 (ref 5–15)
BUN: 18 mg/dL (ref 8–23)
CO2: 25 mmol/L (ref 22–32)
Calcium: 9.7 mg/dL (ref 8.9–10.3)
Chloride: 103 mmol/L (ref 98–111)
Creatinine: 1.46 mg/dL — ABNORMAL HIGH (ref 0.44–1.00)
GFR, Est AFR Am: 38 mL/min — ABNORMAL LOW (ref >60)
GFR, Estimated: 33 mL/min — ABNORMAL LOW (ref >60)
Glucose, Bld: 114 mg/dL — ABNORMAL HIGH (ref 70–99)
Potassium: 3.8 mmol/L (ref 3.5–5.1)
Sodium: 136 mmol/L (ref 135–145)
Total Bilirubin: 0.5 mg/dL (ref 0.3–1.2)
Total Protein: 7.6 g/dL (ref 6.5–8.1)

## 2019-03-26 LAB — CBC WITH DIFFERENTIAL (CANCER CENTER ONLY)
Abs Immature Granulocytes: 0.02 10*3/uL (ref 0.00–0.07)
Basophils Absolute: 0.1 10*3/uL (ref 0.0–0.1)
Basophils Relative: 1 %
Eosinophils Absolute: 0.4 10*3/uL (ref 0.0–0.5)
Eosinophils Relative: 5 %
HCT: 32.3 % — ABNORMAL LOW (ref 36.0–46.0)
Hemoglobin: 11.1 g/dL — ABNORMAL LOW (ref 12.0–15.0)
Immature Granulocytes: 0 %
Lymphocytes Relative: 17 %
Lymphs Abs: 1.4 10*3/uL (ref 0.7–4.0)
MCH: 35.8 pg — ABNORMAL HIGH (ref 26.0–34.0)
MCHC: 34.4 g/dL (ref 30.0–36.0)
MCV: 104.2 fL — ABNORMAL HIGH (ref 80.0–100.0)
Monocytes Absolute: 1.2 10*3/uL — ABNORMAL HIGH (ref 0.1–1.0)
Monocytes Relative: 15 %
Neutro Abs: 5.2 10*3/uL (ref 1.7–7.7)
Neutrophils Relative %: 62 %
Platelet Count: 327 10*3/uL (ref 150–400)
RBC: 3.1 MIL/uL — ABNORMAL LOW (ref 3.87–5.11)
RDW: 19.8 % — ABNORMAL HIGH (ref 11.5–15.5)
WBC Count: 8.3 10*3/uL (ref 4.0–10.5)
nRBC: 0.4 % — ABNORMAL HIGH (ref 0.0–0.2)

## 2019-03-26 LAB — CEA (IN HOUSE-CHCC): CEA (CHCC-In House): 152.95 ng/mL — ABNORMAL HIGH (ref 0.00–5.00)

## 2019-03-26 MED ORDER — REGORAFENIB 40 MG PO TABS: ORAL_TABLET | ORAL | 4 refills | Status: DC

## 2019-03-26 MED ORDER — IOHEXOL 300 MG/ML  SOLN
100.0000 mL | Freq: Once | INTRAMUSCULAR | Status: AC | PRN
  Administered 2019-03-26: 80 mL via INTRAVENOUS

## 2019-03-26 NOTE — Progress Notes (Signed)
Hematology and Oncology Follow Up Visit  Melanie [name] [id number] 06-08-1934 84 y.o. 03/26/2019   Principle Diagnosis:  Metastatic colon cancer -- progressive mets --  NO actionable mutation/LOW TMB/ MSI stable/ KRAS (+)/HER2-  Past Therapy:             FOLFOX q 14 days s/p cycle 3 - DC'd secondary to non-tolerance RFA surgery - January 2019  Current Therapy:   Xeloda 1500 mg po BID (14/7) -- s/p c#4- start on 11/20/2018  -- d/c on 02/28/2019 Avastin 7.5 mg/kg IV q 3 week -- S/p c#4 -started on 06//23/2020 -- d/c on 02/28/2019 Stivarga 80 mg po q day (14 on/7 off) -- start on 04/14/2019   Interim History:  Melanie [name] is here today for follow-up and treatment.  Unfortunately, her cancer clearly is progressing.  Her last CEA level was 106.  Because of this, we did another set of scans on her.  We did the CT scans today.  She clearly has pulmonary disease now.  She has new and numerous and a large pulmonary nodules.  What is in her liver appears to be about the same.  She has a lesion in the right lobe of the liver measuring 8.3 x 7.5 cm.  There is some enlarged mesenteric lymph nodes.  Our options are really not that great at this point.  She just has a very poor tolerance to IV chemotherapy.  She had an incredibly hard time with FOLFOX when we  tried to treat her in the adjuvant setting.  I thought that one option we might be able to consider is Stivarga.  This might be better tolerated.  The only problem with Melanie [name] is that there is some hair loss.  I would treat her with a low-dose.  I will give her 80 mg daily for only 14 days on and 7 days off.  This I could see how she could tolerate.  Again, that we are dealing with quality of life.  This is the most important aspect of her care.  This is what she wants to focus on.  She has no abdominal pain.  She is eating okay.  She is having no change in bowel or bladder habits.  She is not bleeding.  She is having no cough or shortness of  breath.  There is no leg swelling.  She has no headaches.  Overall, I would say her performance status is ECOG 1.  Her blood pressure has been on the high side.  She says it is high today because she is nervous.   Overall, I would have to say that her performance status is ECOG 1.   Medications:  Allergies as of 03/26/2019   No Known Allergies     Medication List       Accurate as of March 26, 2019 11:31 AM. If you have any questions, ask your nurse or doctor.        amlodipine-atorvastatin 10-10 MG tablet Commonly known as: CADUET Take 1 tablet by mouth daily.   aspirin 81 MG chewable tablet Chew by mouth daily.   CALTRATE 600+D PO Take 1 tablet by mouth at bedtime.   ferrous sulfate 325 (65 FE) MG tablet Take 325 mg by mouth daily with breakfast.   levothyroxine 50 MCG tablet Commonly known as: SYNTHROID Take 1 tablet (50 mcg total) by mouth daily before breakfast.   lidocaine-prilocaine cream Commonly known as: EMLA APPLY 1 APPLICATION TOPICALLY AS NEEDED.   meloxicam 7.5 MG  tablet Commonly known as: Mobic Take 1 tablet (7.5 mg total) by mouth daily. You MUST take with food!!   metoprolol tartrate 25 MG tablet Commonly known as: LOPRESSOR Take 50 mg by mouth 2 (two) times daily.   multivitamin with minerals Tabs tablet Take 1 tablet by mouth daily.   ondansetron 4 MG tablet Commonly known as: Zofran Take 1 tablet (4 mg total) by mouth every 8 (eight) hours as needed for nausea or vomiting.   potassium chloride SA 20 MEQ tablet Commonly known as: KLOR-CON Take 1 tablet (20 mEq total) by mouth daily.   VITAMIN B-12 PO Take 1 tablet by mouth daily.   Xeloda 500 MG tablet Generic drug: capecitabine TAKE 3 TABLETS (1,500 MG TOTAL) BY MOUTH 2 (TWO) TIMES DAILY AFTER A MEAL. TAKE FOR 14 DAYS ON AND 7 DAYS OFF.       Allergies: No Known Allergies  Past Medical History, Surgical history, Social history, and Family History were reviewed and updated.   Review of Systems: Review of Systems  Constitutional: Negative.   HENT: Negative.   Eyes: Negative.   Respiratory: Negative.   Cardiovascular: Negative.   Gastrointestinal: Negative.   Genitourinary: Negative.   Musculoskeletal: Negative.   Skin: Negative.   Neurological: Negative.   Endo/Heme/Allergies: Negative.   Psychiatric/Behavioral: Negative.      Physical Exam:  weight is 202 lb (91.6 kg). Her oral temperature is 97.1 F (36.2 C) (abnormal). Her blood pressure is 182/88 (abnormal) and her pulse is 66. Her respiration is 20 and oxygen saturation is 100%.   Wt Readings from Last 3 Encounters:  03/26/19 202 lb (91.6 kg)  02/19/19 205 lb (93 kg)  01/22/19 207 lb 8 oz (94.1 kg)    Physical Exam Vitals signs reviewed.  HENT:     Head: Normocephalic and atraumatic.  Eyes:     Pupils: Pupils are equal, round, and reactive to light.  Neck:     Musculoskeletal: Normal range of motion.  Cardiovascular:     Rate and Rhythm: Normal rate and regular rhythm.     Heart sounds: Normal heart sounds.  Pulmonary:     Effort: Pulmonary effort is normal.     Breath sounds: Normal breath sounds.  Abdominal:     General: Bowel sounds are normal.     Palpations: Abdomen is soft.  Musculoskeletal: Normal range of motion.        General: No tenderness or deformity.  Lymphadenopathy:     Cervical: No cervical adenopathy.  Skin:    General: Skin is warm and dry.     Findings: No erythema or rash.  Neurological:     Mental Status: She is alert and oriented to person, place, and time.  Psychiatric:        Behavior: Behavior normal.        Thought Content: Thought content normal.        Judgment: Judgment normal.      Lab Results  Component Value Date   WBC 8.3 03/26/2019   HGB 11.1 (L) 03/26/2019   HCT 32.3 (L) 03/26/2019   MCV 104.2 (H) 03/26/2019   PLT 327 03/26/2019   Lab Results  Component Value Date   FERRITIN 578 (H) 02/19/2019   IRON 81 02/19/2019   TIBC  279 02/19/2019   UIBC 197 02/19/2019   IRONPCTSAT 29 02/19/2019   Lab Results  Component Value Date   RETICCTPCT 1.4 09/04/2018   RBC 3.10 (L) 03/26/2019   No results found  for: KPAFRELGTCHN, LAMBDASER, KAPLAMBRATIO No results found for: IGGSERUM, IGA, IGMSERUM No results found for: Kathrynn Ducking, MSPIKE, SPEI   Chemistry      Component Value Date/Time   NA 136 03/26/2019 0830   NA 144 04/26/2017 0830   NA 137 09/30/2016 0940   K 3.8 03/26/2019 0830   K 4.1 04/26/2017 0830   K 4.7 09/30/2016 0940   CL 103 03/26/2019 0830   CL 108 04/26/2017 0830   CO2 25 03/26/2019 0830   CO2 27 04/26/2017 0830   CO2 18 (L) 09/30/2016 0940   BUN 18 03/26/2019 0830   BUN 25 (H) 04/26/2017 0830   BUN 21.5 09/30/2016 0940   CREATININE 1.46 (H) 03/26/2019 0830   CREATININE 1.5 (H) 04/26/2017 0830   CREATININE 1.8 (H) 09/30/2016 0940      Component Value Date/Time   CALCIUM 9.7 03/26/2019 0830   CALCIUM 9.9 04/26/2017 0830   CALCIUM 10.4 09/30/2016 0940   ALKPHOS 128 (H) 03/26/2019 0830   ALKPHOS 124 (H) 04/26/2017 0830   ALKPHOS 147 09/30/2016 0940   AST 22 03/26/2019 0830   AST 27 09/30/2016 0940   ALT 5 03/26/2019 0830   ALT 16 04/26/2017 0830   ALT 11 09/30/2016 0940   BILITOT 0.5 03/26/2019 0830   BILITOT 0.34 09/30/2016 0940       Impression and Plan: Melanie [name] is a very pleasant 84 yo African American female with adenocarcinoma of the descending colon that has recurred and is now metastatic.   I really wish that we would have gotten little bit more response out of the Xeloda/Avastin combination.  I forgot to mention that she does have some peeling on her feet.  I am sure this is from the Xeloda.  I also forgot to mention that her sister was listening to our appointment on her cell phone.  I spent about 40 minutes with Melanie [name] and her sister listening on the cell phone.  I gave her some information about the [place].  I  told her that we probably do not need to start the [place] for a couple weeks.  I know with the holidays coming up, we really need to make sure that she does not have side effects so she will be able to enjoy the holidays with her family.  I just feel bad for Melanie [name].  I know she is trying hard.  She still has a good performance status.  She is still active and enjoying her life.  Again, we will have to focus on her quality of life.  I told her that our oral chemotherapy pharmacist will call her and hopefully we will be able to figure out how we can get her the [place] for a minimal cost.  I would like to see her back probably in a month just so that we can see that she is doing okay.     [name], MD 10/27/202011:31 AM

## 2019-03-26 NOTE — Patient Instructions (Signed)
Implanted Port Insertion, Care After This sheet gives you information about how to care for yourself after your procedure. Your health care provider may also give you more specific instructions. If you have problems or questions, contact your health care provider. What can I expect after the procedure? After the procedure, it is common to have:  Discomfort at the port insertion site.  Bruising on the skin over the port. This should improve over 3-4 days. Follow these instructions at home: Port care  After your port is placed, you will get a manufacturer's information card. The card has information about your port. Keep this card with you at all times.  Take care of the port as told by your health care provider. Ask your health care provider if you or a family member can get training for taking care of the port at home. A home health care nurse may also take care of the port.  Make sure to remember what type of port you have. Incision care      Follow instructions from your health care provider about how to take care of your port insertion site. Make sure you: ? Wash your hands with soap and water before and after you change your bandage (dressing). If soap and water are not available, use hand sanitizer. ? Change your dressing as told by your health care provider. ? Leave stitches (sutures), skin glue, or adhesive strips in place. These skin closures may need to stay in place for 2 weeks or longer. If adhesive strip edges start to loosen and curl up, you may trim the loose edges. Do not remove adhesive strips completely unless your health care provider tells you to do that.  Check your port insertion site every day for signs of infection. Check for: ? Redness, swelling, or pain. ? Fluid or blood. ? Warmth. ? Pus or a bad smell. Activity  Return to your normal activities as told by your health care provider. Ask your health care provider what activities are safe for you.  Do not  lift anything that is heavier than 10 lb (4.5 kg), or the limit that you are told, until your health care provider says that it is safe. General instructions  Take over-the-counter and prescription medicines only as told by your health care provider.  Do not take baths, swim, or use a hot tub until your health care provider approves. Ask your health care provider if you may take showers. You may only be allowed to take sponge baths.  Do not drive for 24 hours if you were given a sedative during your procedure.  Wear a medical alert bracelet in case of an emergency. This will tell any health care providers that you have a port.  Keep all follow-up visits as told by your health care provider. This is important. Contact a health care provider if:  You cannot flush your port with saline as directed, or you cannot draw blood from the port.  You have a fever or chills.  You have redness, swelling, or pain around your port insertion site.  You have fluid or blood coming from your port insertion site.  Your port insertion site feels warm to the touch.  You have pus or a bad smell coming from the port insertion site. Get help right away if:  You have chest pain or shortness of breath.  You have bleeding from your port that you cannot control. Summary  Take care of the port as told by your health   care provider. Keep the manufacturer's information card with you at all times.  Change your dressing as told by your health care provider.  Contact a health care provider if you have a fever or chills or if you have redness, swelling, or pain around your port insertion site.  Keep all follow-up visits as told by your health care provider. This information is not intended to replace advice given to you by your health care provider. Make sure you discuss any questions you have with your health care provider. Document Released: 03/06/2013 Document Revised: 12/12/2017 Document Reviewed: 12/12/2017  Elsevier Patient Education  2020 Elsevier Inc[employer]  

## 2019-03-26 NOTE — Telephone Encounter (Signed)
Oral Oncology Pharmacist Encounter  Received new prescription for Stivarga (regorafenib) for the treatment of metastatic colon cancer, planned duration until disease progression or unacceptable toxicity.  Patient's most recent CT scans from 03/26/19 showed numerous new and enlarged pulmonary nodules consistent with worsened pulmonary metastatic disease as well as worsened metastatic disease in the sigmoid mesentery. Patient was most recently being treated with Xeloda and Avastin.   MD documented wanting to start patient on reduced dose of Stivarga 80 mg, take 2 tablets (40 mg) by mouth daily for 14 days on and 7 days off, repeated every 21 days. Provider has lowered dose and frequency of Stivarga due to concern for patient tolerability. Normal frequency of Stivarga administration is on days 1 to 21 of a 28 day treatment cycle.  CMP and CBC from 03/26/19 assessed:  Scr elevated at 1.46 (CrCl ~32 mL/min) - no dose adjustments necessary at this time.  Hgb 11.1, pltc and WBC stable - OK for treatment initiation.     Noted patient's last BP on 03/26/19 was 182/88 mmHg. Stivarga has risk of increasing BP. Noted patient has Caduet (amlodipine-atorvastatin) and metoprolol tartrate on medication list. Patient states BP is only elevated at MD office - recommend actively monitoring BP during treatment with [place].   Current medication list in Epic reviewed, DDIs with Stivarga identified:  Category C DDI between [place] and metoprolol tartrate - Stivarga may increase bradycardic effects of beta-blockers. Will counsel patient on monitoring for s/sx of bradycardia (fatigue, dizziness)  Prescription has been e-scribed to the [facility] for benefits analysis and approval.  Oral Oncology Clinic will continue to follow for insurance authorization, copayment issues, initial counseling and start date.  Leron Croak, PharmD PGY2 Hematology/Oncology Pharmacy Resident 03/26/2019 4:04  PM Oral Oncology Clinic [phone]

## 2019-03-27 ENCOUNTER — Telehealth: Payer: Self-pay | Admitting: Pharmacist

## 2019-03-27 NOTE — Telephone Encounter (Signed)
Oral Oncology Pharmacist Encounter   Received notification from [employer] that prior authorization for [place] is required.   PA submitted on CMM Key [id number] Status is pending   Oral Oncology Clinic will continue to follow.   Darl Pikes, PharmD, BCPS, [facility] Hematology/Oncology Clinical Pharmacist [facility] [place] [facility] [phone]  03/27/2019 9:23 AM

## 2019-04-02 ENCOUNTER — Ambulatory Visit: Payer: Medicare Other | Admitting: Hematology & Oncology

## 2019-04-02 ENCOUNTER — Other Ambulatory Visit: Payer: Medicare Other

## 2019-04-02 ENCOUNTER — Ambulatory Visit: Payer: Medicare Other

## 2019-04-04 NOTE — Telephone Encounter (Signed)
Oral Oncology Pharmacist Encounter   Prior Authorization for [name] has been denied.     Appeal letter was faxed to [place] today 04/04/2019. Sent in as an urgent appeal.  Oral Oncology Clinic will continue to follow.  Darl Pikes, PharmD, BCPS. BCOP Hematology/Oncology Clinical Pharmacist [facility] Oral Chemotherapy [facility] [phone]  04/04/2019 10:23 AM

## 2019-04-11 ENCOUNTER — Telehealth: Payer: Self-pay | Admitting: Pharmacist

## 2019-04-11 NOTE — Telephone Encounter (Signed)
Oral Chemotherapy Pharmacist Encounter  Completed application for Stivarga in an effort to reduce patient's out of pocket expense for Stivarga to $0.    Application completed and faxed to the REACH program Psychologist, occupational patient assistance).   REACH patient assistance phone number for follow up is [phone].   This encounter will be updated until final determination.   Darl Pikes, PharmD, BCPS, [facility] Hematology/Oncology Clinical Pharmacist [facility] [place] [facility] [phone]  04/11/2019 12:01 PM

## 2019-04-15 ENCOUNTER — Telehealth: Payer: Self-pay | Admitting: Pharmacy Technician

## 2019-04-15 NOTE — Telephone Encounter (Signed)
Oral Oncology Patient Advocate Encounter   Was successful in securing patient a $4000 grant from [employer] (PAF) to provide copayment coverage for Stivarga.  This will keep the out of pocket expense at $0.     I have spoken with the patient.    The billing information is as follows and has been shared with [place].   RxBin: [id number] PCN:  [id number] Member ID: [id number] Group ID: [id number] Dates of Eligibility: 04/15/2019 through 04/14/2020  Fund:  Metastatic Colorectal [name] Phone [phone] Fax [phone] 04/15/2019 2:39 PM

## 2019-04-16 ENCOUNTER — Ambulatory Visit: Payer: Medicare Other | Admitting: Hematology & Oncology

## 2019-04-16 ENCOUNTER — Ambulatory Visit: Payer: Medicare Other

## 2019-04-16 ENCOUNTER — Other Ambulatory Visit: Payer: Medicare Other

## 2019-04-19 ENCOUNTER — Telehealth: Payer: Self-pay | Admitting: Pharmacist

## 2019-04-19 DIAGNOSIS — C189 Malignant neoplasm of colon, unspecified: Secondary | ICD-10-CM

## 2019-04-19 MED FILL — STIVARGA 40 MG TABLET: 40 | 21 days supply | Qty: 28 | Fill #0

## 2019-04-19 NOTE — Telephone Encounter (Signed)
Oral Chemotherapy Pharmacist Encounter  [name] will be shipped by [facility] today 04/19/19 for delivery either 04/20/2019 or 04/22/2019. Due to insurance restrictions the second fill will have to be filled at [facility].   Patient Education I spoke with patient's caregiver/sister Westley Chandler for overview of new oral chemotherapy medication: Stivarga (regorafenib) for the treatment of metastatic colon cancer, planned duration no relevant lab abnormalities.   Counseled Ms. Mayo on administration, dosing, side effects, monitoring, drug-food interactions, safe handling, storage, and disposal. Patient will take 2 tablets daily for 14 days on and 7 days off.  Side effects include but not limited to: hand foot syndrome, diarrhea, increased BP, decreased wbc, fatigue.    Reviewed with patient importance of keeping a medication schedule and plan for any missed doses.  Ms. Brett Albino voiced understanding and appreciation. All questions answered. Medication handout and calendar placed in the mail.  Provided patient with Oral [place] [facility] phone number. Patient knows to call the office with questions or concerns. Oral Chemotherapy Navigation Clinic will continue to follow.  Darl Pikes, PharmD, BCPS, [facility] Hematology/Oncology Clinical Pharmacist [facility] [place] [facility] [phone]  04/19/2019 10:36 AM

## 2019-04-19 NOTE — Telephone Encounter (Signed)
Oral Chemotherapy Pharmacist Encounter   Appeal approved, will proceed with filling Stivarga.  Darl Pikes, PharmD, BCPS, BCOP Hematology/Oncology Clinical Pharmacist [facility] [place] [facility] [phone]

## 2019-04-22 MED ORDER — REGORAFENIB 40 MG PO TABS: ORAL_TABLET | ORAL | 3 refills | Status: DC

## 2019-04-22 NOTE — Telephone Encounter (Signed)
Oral Chemotherapy Pharmacist Encounter   REACH program assistance no longer needed.  Darl Pikes, PharmD, BCPS, [facility] Hematology/Oncology Clinical Pharmacist [facility] [place] [facility] [phone]  04/22/2019 11:01 AM

## 2019-04-22 NOTE — Telephone Encounter (Signed)
Oral Chemotherapy Pharmacist Encounter  Due to insurance restrictions the medication could not be filled at [facility] past the first fill. Remaining refills have been redirected to [facility].  Supportive information was faxed to [place]. We will continue to follow medication access.   Darl Pikes, PharmD, BCPS, [facility] Hematology/Oncology Clinical Pharmacist [facility] [place] [facility] [phone]  04/22/2019 10:11 AM

## 2019-04-23 ENCOUNTER — Inpatient Hospital Stay: Payer: Medicare Other | Attending: Hematology & Oncology

## 2019-04-23 ENCOUNTER — Other Ambulatory Visit: Payer: Self-pay

## 2019-04-23 ENCOUNTER — Inpatient Hospital Stay (HOSPITAL_BASED_OUTPATIENT_CLINIC_OR_DEPARTMENT_OTHER): Payer: Medicare Other | Admitting: Hematology & Oncology

## 2019-04-23 ENCOUNTER — Inpatient Hospital Stay: Payer: Medicare Other

## 2019-04-23 VITALS — BP 127/77 | HR 66 | Temp 97.3°F | Resp 19 | Wt 195.0 lb

## 2019-04-23 DIAGNOSIS — C189 Malignant neoplasm of colon, unspecified: Secondary | ICD-10-CM

## 2019-04-23 DIAGNOSIS — E032 Hypothyroidism due to medicaments and other exogenous substances: Secondary | ICD-10-CM | POA: Diagnosis not present

## 2019-04-23 DIAGNOSIS — C787 Secondary malignant neoplasm of liver and intrahepatic bile duct: Secondary | ICD-10-CM

## 2019-04-23 DIAGNOSIS — C186 Malignant neoplasm of descending colon: Secondary | ICD-10-CM | POA: Insufficient documentation

## 2019-04-23 DIAGNOSIS — Z79899 Other long term (current) drug therapy: Secondary | ICD-10-CM | POA: Insufficient documentation

## 2019-04-23 DIAGNOSIS — D500 Iron deficiency anemia secondary to blood loss (chronic): Secondary | ICD-10-CM

## 2019-04-23 LAB — CBC WITH DIFFERENTIAL (CANCER CENTER ONLY)
Abs Immature Granulocytes: 0.09 10*3/uL — ABNORMAL HIGH (ref 0.00–0.07)
Basophils Absolute: 0.1 10*3/uL (ref 0.0–0.1)
Basophils Relative: 1 %
Eosinophils Absolute: 0.4 10*3/uL (ref 0.0–0.5)
Eosinophils Relative: 4 %
HCT: 35.4 % — ABNORMAL LOW (ref 36.0–46.0)
Hemoglobin: 11.9 g/dL — ABNORMAL LOW (ref 12.0–15.0)
Immature Granulocytes: 1 %
Lymphocytes Relative: 16 %
Lymphs Abs: 1.5 10*3/uL (ref 0.7–4.0)
MCH: 32.9 pg (ref 26.0–34.0)
MCHC: 33.6 g/dL (ref 30.0–36.0)
MCV: 97.8 fL (ref 80.0–100.0)
Monocytes Absolute: 1.2 10*3/uL — ABNORMAL HIGH (ref 0.1–1.0)
Monocytes Relative: 13 %
Neutro Abs: 6.1 10*3/uL (ref 1.7–7.7)
Neutrophils Relative %: 65 %
Platelet Count: 425 10*3/uL — ABNORMAL HIGH (ref 150–400)
RBC: 3.62 MIL/uL — ABNORMAL LOW (ref 3.87–5.11)
RDW: 16.9 % — ABNORMAL HIGH (ref 11.5–15.5)
WBC Count: 9.3 10*3/uL (ref 4.0–10.5)
nRBC: 0 % (ref 0.0–0.2)

## 2019-04-23 LAB — CMP (CANCER CENTER ONLY)
ALT: 5 U/L (ref 0–44)
AST: 19 U/L (ref 15–41)
Albumin: 3.7 g/dL (ref 3.5–5.0)
Alkaline Phosphatase: 149 U/L — ABNORMAL HIGH (ref 38–126)
Anion gap: 9 (ref 5–15)
BUN: 19 mg/dL (ref 8–23)
CO2: 24 mmol/L (ref 22–32)
Calcium: 10 mg/dL (ref 8.9–10.3)
Chloride: 104 mmol/L (ref 98–111)
Creatinine: 1.49 mg/dL — ABNORMAL HIGH (ref 0.44–1.00)
GFR, Est AFR Am: 38 mL/min — ABNORMAL LOW (ref >60)
GFR, Estimated: 32 mL/min — ABNORMAL LOW (ref >60)
Glucose, Bld: 142 mg/dL — ABNORMAL HIGH (ref 70–99)
Potassium: 4 mmol/L (ref 3.5–5.1)
Sodium: 137 mmol/L (ref 135–145)
Total Bilirubin: 0.4 mg/dL (ref 0.3–1.2)
Total Protein: 8.2 g/dL — ABNORMAL HIGH (ref 6.5–8.1)

## 2019-04-23 LAB — TSH: TSH: 10.433 u[IU]/mL — ABNORMAL HIGH (ref 0.308–3.960)

## 2019-04-23 LAB — CEA (IN HOUSE-CHCC): CEA (CHCC-In House): 103.36 ng/mL — ABNORMAL HIGH (ref 0.00–5.00)

## 2019-04-23 MED ORDER — SODIUM CHLORIDE 0.9 % IV SOLN
Freq: Once | INTRAVENOUS | Status: DC
  Filled 2019-04-23: qty 250

## 2019-04-23 MED ORDER — SODIUM CHLORIDE 0.9% FLUSH
10.0000 mL | Freq: Once | INTRAVENOUS | Status: AC | PRN
  Administered 2019-04-23: 10 mL
  Filled 2019-04-23: qty 10

## 2019-04-23 MED ORDER — HEPARIN SOD (PORK) LOCK FLUSH 100 UNIT/ML IV SOLN
500.0000 [IU] | Freq: Once | INTRAVENOUS | Status: AC
  Administered 2019-04-23: 500 [IU] via INTRAVENOUS
  Filled 2019-04-23: qty 5

## 2019-04-23 NOTE — Progress Notes (Signed)
Hematology and Oncology Follow Up Visit  Melanie [name] [id number] 09-04-1934 84 y.o. 04/23/2019   Principle Diagnosis:  Metastatic colon cancer -- progressive mets --  NO actionable mutation/LOW TMB/ MSI stable/ KRAS (+)/HER2-  Past Therapy:             FOLFOX q 14 days s/p cycle 3 - DC'd secondary to non-tolerance RFA surgery - January 2019  Current Therapy:   Xeloda 1500 mg po BID (14/7) -- s/p c#4- start on 11/20/2018  -- d/c on 02/28/2019 Avastin 7.5 mg/kg IV q 3 week -- S/p c#4 -started on 06//23/2020 -- d/c on 02/28/2019 Stivarga 80 mg po q day (14 on/7 off) -- start on 04/24/2019   Interim History:  Melanie [name] is here today for follow-up.  She has not yet started the [place].  She will start this tomorrow.  Again, we did reduce the dose of the Stivarga to see if she can tolerate this.  Her last CEA level that was drawn about 3 or 4 weeks ago was 153.  She and her sister will be spending Thanksgiving to gather.  She wants to go back home for Christmas.  I do not think this should be much of an issue from my point of view.  She has had no problems with bleeding.  There is no change in bowel or bladder habits.  She has had no cough.  There has been no mouth sores.  She has had no leg swelling.  She has had no tingling in the hands or feet.  Overall, her performance status is ECOG 1.    Medications:  Allergies as of 04/23/2019   No Known Allergies     Medication List       Accurate as of April 23, 2019  9:34 AM. If you have any questions, ask your nurse or doctor.        amlodipine-atorvastatin 10-10 MG tablet Commonly known as: CADUET Take 1 tablet by mouth daily.   aspirin 81 MG chewable tablet Chew by mouth daily.   CALTRATE 600+D PO Take 1 tablet by mouth at bedtime.   ferrous sulfate 325 (65 FE) MG tablet Take 325 mg by mouth daily with breakfast.   levothyroxine 50 MCG tablet Commonly known as: SYNTHROID Take 1 tablet (50 mcg total) by mouth  daily before breakfast.   lidocaine-prilocaine cream Commonly known as: EMLA APPLY 1 APPLICATION TOPICALLY AS NEEDED.   meloxicam 7.5 MG tablet Commonly known as: Mobic Take 1 tablet (7.5 mg total) by mouth daily. You MUST take with food!!   metoprolol tartrate 25 MG tablet Commonly known as: LOPRESSOR Take 50 mg by mouth 2 (two) times daily.   multivitamin with minerals Tabs tablet Take 1 tablet by mouth daily.   naproxen 500 MG EC tablet Commonly known as: EC NAPROSYN Take 500 mg by mouth 2 (two) times daily with a meal.   ondansetron 4 MG tablet Commonly known as: Zofran Take 1 tablet (4 mg total) by mouth every 8 (eight) hours as needed for nausea or vomiting.   potassium chloride SA 20 MEQ tablet Commonly known as: KLOR-CON Take 1 tablet (20 mEq total) by mouth daily.   regorafenib 40 MG tablet Commonly known as: STIVARGA Take 2 tablets daily for 14 days on and 7 days off.   VITAMIN B-12 PO Take 1 tablet by mouth daily.   Xeloda 500 MG tablet Generic drug: capecitabine TAKE 3 TABLETS (1,500 MG TOTAL) BY MOUTH 2 (TWO) TIMES DAILY AFTER  A MEAL. TAKE FOR 14 DAYS ON AND 7 DAYS OFF.       Allergies: No Known Allergies  Past Medical History, Surgical history, Social history, and Family History were reviewed and updated.  Review of Systems: Review of Systems  Constitutional: Negative.   HENT: Negative.   Eyes: Negative.   Respiratory: Negative.   Cardiovascular: Negative.   Gastrointestinal: Negative.   Genitourinary: Negative.   Musculoskeletal: Negative.   Skin: Negative.   Neurological: Negative.   Endo/Heme/Allergies: Negative.   Psychiatric/Behavioral: Negative.      Physical Exam:  weight is 195 lb (88.5 kg). Her temporal temperature is 97.3 F (36.3 C) (abnormal). Her blood pressure is 127/77 and her pulse is 66. Her respiration is 19 and oxygen saturation is 100%.   Wt Readings from Last 3 Encounters:  04/23/19 195 lb (88.5 kg)  03/26/19 202  lb (91.6 kg)  02/19/19 205 lb (93 kg)    Physical Exam Vitals signs reviewed.  HENT:     Head: Normocephalic and atraumatic.  Eyes:     Pupils: Pupils are equal, round, and reactive to light.  Neck:     Musculoskeletal: Normal range of motion.  Cardiovascular:     Rate and Rhythm: Normal rate and regular rhythm.     Heart sounds: Normal heart sounds.  Pulmonary:     Effort: Pulmonary effort is normal.     Breath sounds: Normal breath sounds.  Abdominal:     General: Bowel sounds are normal.     Palpations: Abdomen is soft.  Musculoskeletal: Normal range of motion.        General: No tenderness or deformity.  Lymphadenopathy:     Cervical: No cervical adenopathy.  Skin:    General: Skin is warm and dry.     Findings: No erythema or rash.  Neurological:     Mental Status: She is alert and oriented to person, place, and time.  Psychiatric:        Behavior: Behavior normal.        Thought Content: Thought content normal.        Judgment: Judgment normal.      Lab Results  Component Value Date   WBC 9.3 04/23/2019   HGB 11.9 (L) 04/23/2019   HCT 35.4 (L) 04/23/2019   MCV 97.8 04/23/2019   PLT 425 (H) 04/23/2019   Lab Results  Component Value Date   FERRITIN 578 (H) 02/19/2019   IRON 81 02/19/2019   TIBC 279 02/19/2019   UIBC 197 02/19/2019   IRONPCTSAT 29 02/19/2019   Lab Results  Component Value Date   RETICCTPCT 1.4 09/04/2018   RBC 3.62 (L) 04/23/2019   No results found for: KPAFRELGTCHN, LAMBDASER, KAPLAMBRATIO No results found for: IGGSERUM, IGA, IGMSERUM No results found for: Odetta Pink, SPEI   Chemistry      Component Value Date/Time   NA 136 03/26/2019 0830   NA 144 04/26/2017 0830   NA 137 09/30/2016 0940   K 3.8 03/26/2019 0830   K 4.1 04/26/2017 0830   K 4.7 09/30/2016 0940   CL 103 03/26/2019 0830   CL 108 04/26/2017 0830   CO2 25 03/26/2019 0830   CO2 27 04/26/2017 0830   CO2 18  (L) 09/30/2016 0940   BUN 18 03/26/2019 0830   BUN 25 (H) 04/26/2017 0830   BUN 21.5 09/30/2016 0940   CREATININE 1.46 (H) 03/26/2019 0830   CREATININE 1.5 (H) 04/26/2017 0830  CREATININE 1.8 (H) 09/30/2016 0940      Component Value Date/Time   CALCIUM 9.7 03/26/2019 0830   CALCIUM 9.9 04/26/2017 0830   CALCIUM 10.4 09/30/2016 0940   ALKPHOS 128 (H) 03/26/2019 0830   ALKPHOS 124 (H) 04/26/2017 0830   ALKPHOS 147 09/30/2016 0940   AST 22 03/26/2019 0830   AST 27 09/30/2016 0940   ALT 5 03/26/2019 0830   ALT 16 04/26/2017 0830   ALT 11 09/30/2016 0940   BILITOT 0.5 03/26/2019 0830   BILITOT 0.34 09/30/2016 0940       Impression and Plan: Melanie [name] is a very pleasant 84 yo African American female with adenocarcinoma of the descending colon that has recurred and is now metastatic.   Hopefully, she will not have any problems with the Stivarga.  We are given her dose reduction of Stivarga.  I still will not plan for any type of scans for least 2 or 3 months.  I think that the CEA level will give [place] an idea as to how well she is responding.  I would like to get her back to see [place] in another 3 weeks or so.    [name], MD 11/24/20209:34 AM

## 2019-04-23 NOTE — Patient Instructions (Signed)

## 2019-04-24 ENCOUNTER — Telehealth: Payer: Self-pay | Admitting: *Deleted

## 2019-04-24 NOTE — Telephone Encounter (Signed)
Per MD-Lab results faxed to pt PCP [facility] [phone]

## 2019-05-10 ENCOUNTER — Telehealth: Payer: Self-pay | Admitting: *Deleted

## 2019-05-10 NOTE — Telephone Encounter (Signed)
Returned [employer] (sister,) phone call, regarding patient. She stated,"I talked to her this morning, she is staying with my other sister in [place], she is very, very, short of breath, has mucus coming out of her nose, trouble standing up, decreased appetite, and has no energy. I can go and get her and bring her back so Dr. Marin Olp can see her." I instructed her to call the sister in [place] and take the patient to an Urgent Care or an ER, whichever is closer. She needs to be seen and assessed, especially going into the weekend. She verbalized understanding.

## 2019-05-14 ENCOUNTER — Inpatient Hospital Stay: Payer: Medicare Other

## 2019-05-14 ENCOUNTER — Inpatient Hospital Stay: Payer: Medicare Other | Attending: Hematology & Oncology

## 2019-05-14 ENCOUNTER — Inpatient Hospital Stay (HOSPITAL_BASED_OUTPATIENT_CLINIC_OR_DEPARTMENT_OTHER): Payer: Medicare Other | Admitting: Family

## 2019-05-14 ENCOUNTER — Other Ambulatory Visit: Payer: Self-pay

## 2019-05-14 ENCOUNTER — Ambulatory Visit (HOSPITAL_BASED_OUTPATIENT_CLINIC_OR_DEPARTMENT_OTHER)
Admission: RE | Admit: 2019-05-14 | Discharge: 2019-05-14 | Disposition: A | Discharge status: Home / Self Care | Payer: Medicare Other | Source: Ambulatory Visit | Attending: Family | Admitting: Family

## 2019-05-14 ENCOUNTER — Other Ambulatory Visit: Payer: Self-pay | Admitting: *Deleted

## 2019-05-14 VITALS — BP 121/68 | HR 62 | Temp 98.2°F | Resp 17 | Wt 183.2 lb

## 2019-05-14 DIAGNOSIS — E032 Hypothyroidism due to medicaments and other exogenous substances: Secondary | ICD-10-CM

## 2019-05-14 DIAGNOSIS — R634 Abnormal weight loss: Secondary | ICD-10-CM

## 2019-05-14 DIAGNOSIS — C787 Secondary malignant neoplasm of liver and intrahepatic bile duct: Secondary | ICD-10-CM | POA: Insufficient documentation

## 2019-05-14 DIAGNOSIS — C189 Malignant neoplasm of colon, unspecified: Secondary | ICD-10-CM

## 2019-05-14 DIAGNOSIS — R110 Nausea: Secondary | ICD-10-CM | POA: Insufficient documentation

## 2019-05-14 DIAGNOSIS — K921 Melena: Secondary | ICD-10-CM

## 2019-05-14 DIAGNOSIS — R0602 Shortness of breath: Secondary | ICD-10-CM | POA: Insufficient documentation

## 2019-05-14 DIAGNOSIS — D500 Iron deficiency anemia secondary to blood loss (chronic): Secondary | ICD-10-CM

## 2019-05-14 DIAGNOSIS — R142 Eructation: Secondary | ICD-10-CM

## 2019-05-14 DIAGNOSIS — Z79899 Other long term (current) drug therapy: Secondary | ICD-10-CM | POA: Insufficient documentation

## 2019-05-14 DIAGNOSIS — D649 Anemia, unspecified: Secondary | ICD-10-CM | POA: Diagnosis present

## 2019-05-14 DIAGNOSIS — R531 Weakness: Secondary | ICD-10-CM | POA: Diagnosis not present

## 2019-05-14 DIAGNOSIS — C186 Malignant neoplasm of descending colon: Secondary | ICD-10-CM | POA: Diagnosis present

## 2019-05-14 DIAGNOSIS — R5383 Other fatigue: Secondary | ICD-10-CM | POA: Insufficient documentation

## 2019-05-14 LAB — CBC WITH DIFFERENTIAL (CANCER CENTER ONLY)
Abs Immature Granulocytes: 0.32 10*3/uL — ABNORMAL HIGH (ref 0.00–0.07)
Basophils Absolute: 0.1 10*3/uL (ref 0.0–0.1)
Basophils Relative: 1 %
Eosinophils Absolute: 0.3 10*3/uL (ref 0.0–0.5)
Eosinophils Relative: 2 %
HCT: 23.6 % — ABNORMAL LOW (ref 36.0–46.0)
Hemoglobin: 7.7 g/dL — ABNORMAL LOW (ref 12.0–15.0)
Immature Granulocytes: 2 %
Lymphocytes Relative: 10 %
Lymphs Abs: 1.9 10*3/uL (ref 0.7–4.0)
MCH: 33.8 pg (ref 26.0–34.0)
MCHC: 32.6 g/dL (ref 30.0–36.0)
MCV: 103.5 fL — ABNORMAL HIGH (ref 80.0–100.0)
Monocytes Absolute: 2 10*3/uL — ABNORMAL HIGH (ref 0.1–1.0)
Monocytes Relative: 11 %
Neutro Abs: 14.2 10*3/uL — ABNORMAL HIGH (ref 1.7–7.7)
Neutrophils Relative %: 74 %
Platelet Count: 440 10*3/uL — ABNORMAL HIGH (ref 150–400)
RBC: 2.28 MIL/uL — ABNORMAL LOW (ref 3.87–5.11)
RDW: 21.2 % — ABNORMAL HIGH (ref 11.5–15.5)
WBC Count: 18.9 10*3/uL — ABNORMAL HIGH (ref 4.0–10.5)
nRBC: 1.8 % — ABNORMAL HIGH (ref 0.0–0.2)

## 2019-05-14 LAB — CMP (CANCER CENTER ONLY)
ALT: 6 U/L (ref 0–44)
AST: 19 U/L (ref 15–41)
Albumin: 3.5 g/dL (ref 3.5–5.0)
Alkaline Phosphatase: 167 U/L — ABNORMAL HIGH (ref 38–126)
Anion gap: 8 (ref 5–15)
BUN: 26 mg/dL — ABNORMAL HIGH (ref 8–23)
CO2: 23 mmol/L (ref 22–32)
Calcium: 9.3 mg/dL (ref 8.9–10.3)
Chloride: 105 mmol/L (ref 98–111)
Creatinine: 1.43 mg/dL — ABNORMAL HIGH (ref 0.44–1.00)
GFR, Est AFR Am: 39 mL/min — ABNORMAL LOW (ref >60)
GFR, Estimated: 34 mL/min — ABNORMAL LOW (ref >60)
Glucose, Bld: 118 mg/dL — ABNORMAL HIGH (ref 70–99)
Potassium: 4 mmol/L (ref 3.5–5.1)
Sodium: 136 mmol/L (ref 135–145)
Total Bilirubin: 0.4 mg/dL (ref 0.3–1.2)
Total Protein: 7.2 g/dL (ref 6.5–8.1)

## 2019-05-14 MED ORDER — SODIUM CHLORIDE 0.9 % IV SOLN
510.0000 mg | Freq: Once | INTRAVENOUS | Status: AC
  Administered 2019-05-14: 510 mg via INTRAVENOUS
  Filled 2019-05-14: qty 510

## 2019-05-14 MED ORDER — SODIUM CHLORIDE 0.9 % IV SOLN
Freq: Once | INTRAVENOUS | Status: AC
  Filled 2019-05-14: qty 250

## 2019-05-14 MED ORDER — HEPARIN SOD (PORK) LOCK FLUSH 100 UNIT/ML IV SOLN
500.0000 [IU] | Freq: Once | INTRAVENOUS | Status: AC
  Administered 2019-05-14: 15:00:00 500 [IU] via INTRAVENOUS
  Filled 2019-05-14: qty 5

## 2019-05-14 MED ORDER — SODIUM CHLORIDE 0.9% FLUSH
10.0000 mL | INTRAVENOUS | Status: DC | PRN
  Administered 2019-05-14: 10 mL via INTRAVENOUS
  Filled 2019-05-14: qty 10
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
## 2019-05-14 NOTE — Patient Instructions (Signed)

## 2019-05-14 NOTE — Patient Instructions (Signed)

## 2019-05-15 ENCOUNTER — Telehealth: Payer: Self-pay | Admitting: Family

## 2019-05-15 ENCOUNTER — Inpatient Hospital Stay: Payer: Medicare Other

## 2019-05-15 ENCOUNTER — Other Ambulatory Visit: Payer: Self-pay | Admitting: Family

## 2019-05-15 ENCOUNTER — Inpatient Hospital Stay: Payer: Medicare Other | Admitting: Family

## 2019-05-15 ENCOUNTER — Telehealth: Payer: Self-pay | Admitting: *Deleted

## 2019-05-15 DIAGNOSIS — D500 Iron deficiency anemia secondary to blood loss (chronic): Secondary | ICD-10-CM

## 2019-05-15 DIAGNOSIS — C186 Malignant neoplasm of descending colon: Secondary | ICD-10-CM

## 2019-05-15 DIAGNOSIS — E032 Hypothyroidism due to medicaments and other exogenous substances: Secondary | ICD-10-CM

## 2019-05-15 LAB — FERRITIN: Ferritin: 252 ng/mL (ref 11–307)

## 2019-05-15 LAB — IRON AND TIBC
Iron: 93 ug/dL (ref 41–142)
Saturation Ratios: 36 % (ref 21–57)
TIBC: 255 ug/dL (ref 236–444)
UIBC: 162 ug/dL (ref 120–384)

## 2019-05-15 LAB — PREPARE RBC (CROSSMATCH)

## 2019-05-15 LAB — CEA (IN HOUSE-CHCC): CEA (CHCC-In House): 77.78 ng/mL — ABNORMAL HIGH (ref 0.00–5.00)

## 2019-05-15 LAB — ABO/RH: ABO/RH(D): O POS

## 2019-05-15 LAB — TSH: TSH: 11.109 u[IU]/mL — ABNORMAL HIGH (ref 0.308–3.960)

## 2019-05-15 MED ORDER — LEVOTHYROXINE SODIUM 75 MCG PO TABS: 75.0000 ug | ORAL_TABLET | Freq: Every day | ORAL | 2 refills | Status: DC

## 2019-05-15 NOTE — Telephone Encounter (Signed)
As noted below by Laverna Peace, NP, I informed [name], sister, that there was no bowel obstruction. Her stool was positive for blood, so, she has made a referral to GI. She will get blood tomorrow. [name] verbalized understanding.

## 2019-05-15 NOTE — Telephone Encounter (Signed)
I was able to speak with Ms. Ewell and her daughter and tlet them know that her TSH was elevated and we had increased her Synthroid to 75 mcg PO daily. They verbalized understanding and will contact the pharmacy to see when they can pick up.

## 2019-05-15 NOTE — Telephone Encounter (Signed)
No los 12/15 

## 2019-05-15 NOTE — Telephone Encounter (Signed)
-----   Message from Eliezer Bottom, NP sent at 05/15/2019 10:28 AM EST ----- No obstruction!!!! Thank goodness!!!! Her stool was positive for blood so I have referred her to GI across the hall. Laurey Arrow says she needs a colonoscopy. We will see what they say. She gets blood tomorrow and I sent a scheduling message for a follow-up in 1 weeks with [facility].    Sarah   ----- Message ----- From: Buel Ream, Rad Results In Sent: 05/15/2019  10:10 AM EST To: Eliezer Bottom, NP

## 2019-05-16 ENCOUNTER — Other Ambulatory Visit: Payer: Self-pay | Admitting: Hematology & Oncology

## 2019-05-16 ENCOUNTER — Other Ambulatory Visit: Payer: Self-pay

## 2019-05-16 ENCOUNTER — Inpatient Hospital Stay: Payer: Medicare Other

## 2019-05-16 DIAGNOSIS — D649 Anemia, unspecified: Secondary | ICD-10-CM

## 2019-05-16 DIAGNOSIS — C189 Malignant neoplasm of colon, unspecified: Secondary | ICD-10-CM

## 2019-05-16 DIAGNOSIS — C186 Malignant neoplasm of descending colon: Secondary | ICD-10-CM | POA: Diagnosis not present

## 2019-05-16 MED ORDER — DIPHENHYDRAMINE HCL 25 MG PO CAPS
ORAL_CAPSULE | ORAL | Status: AC
  Filled 2019-05-16: qty 1

## 2019-05-16 MED ORDER — DIPHENHYDRAMINE HCL 25 MG PO CAPS
25.0000 mg | ORAL_CAPSULE | Freq: Once | ORAL | Status: AC
  Administered 2019-05-16: 10:00:00 25 mg via ORAL

## 2019-05-16 MED ORDER — ACETAMINOPHEN 325 MG PO TABS
ORAL_TABLET | ORAL | Status: AC
  Filled 2019-05-16: qty 2

## 2019-05-16 MED ORDER — ACETAMINOPHEN 325 MG PO TABS
650.0000 mg | ORAL_TABLET | Freq: Once | ORAL | Status: AC
  Administered 2019-05-16: 650 mg via ORAL

## 2019-05-16 NOTE — Patient Instructions (Signed)
Blood Transfusion, Adult, Care After This sheet gives you information about how to care for yourself after your procedure. Your doctor may also give you more specific instructions. If you have problems or questions, contact your doctor. Follow these instructions at home:   Take over-the-counter and prescription medicines only as told by your doctor.  Go back to your normal activities as told by your doctor.  Follow instructions from your doctor about how to take care of the area where an IV tube was put into your vein (insertion site). Make sure you: ? Wash your hands with soap and water before you change your bandage (dressing). If there is no soap and water, use hand sanitizer. ? Change your bandage as told by your doctor.  Check your IV insertion site every day for signs of infection. Check for: ? More redness, swelling, or pain. ? More fluid or blood. ? Warmth. ? Pus or a bad smell. Contact a doctor if:  You have more redness, swelling, or pain around the IV insertion site.  You have more fluid or blood coming from the IV insertion site.  Your IV insertion site feels warm to the touch.  You have pus or a bad smell coming from the IV insertion site.  Your pee (urine) turns pink, red, or brown.  You feel weak after doing your normal activities. Get help right away if:  You have signs of a serious allergic or body defense (immune) system reaction, including: ? Itchiness. ? Hives. ? Trouble breathing. ? Anxiety. ? Pain in your chest or lower back. ? Fever, flushing, and chills. ? Fast pulse. ? Rash. ? Watery poop (diarrhea). ? Throwing up (vomiting). ? Dark pee. ? Serious headache. ? Dizziness. ? Stiff neck. ? Yellow color in your face or the white parts of your eyes (jaundice). Summary  After a blood transfusion, return to your normal activities as told by your doctor.  Every day, check for signs of infection where the IV tube was put into your vein.  Some  signs of infection are warm skin, more redness and pain, more fluid or blood, and pus or a bad smell where the needle went in.  Contact your doctor if you feel weak or have any unusual symptoms. This information is not intended to replace advice given to you by your health care provider. Make sure you discuss any questions you have with your health care provider. Document Released: 06/06/2014 Document Revised: 09/20/2017 Document Reviewed: 01/08/2016 Elsevier Patient Education  2020 [employer]  

## 2019-05-17 ENCOUNTER — Ambulatory Visit (INDEPENDENT_AMBULATORY_CARE_PROVIDER_SITE_OTHER): Payer: Medicare Other | Admitting: Gastroenterology

## 2019-05-17 ENCOUNTER — Encounter: Payer: Self-pay | Admitting: Gastroenterology

## 2019-05-17 VITALS — BP 138/82 | HR 62 | Temp 97.4°F | Ht 63.0 in | Wt 194.2 lb

## 2019-05-17 DIAGNOSIS — K5904 Chronic idiopathic constipation: Secondary | ICD-10-CM

## 2019-05-17 DIAGNOSIS — C787 Secondary malignant neoplasm of liver and intrahepatic bile duct: Secondary | ICD-10-CM

## 2019-05-17 DIAGNOSIS — D638 Anemia in other chronic diseases classified elsewhere: Secondary | ICD-10-CM

## 2019-05-17 DIAGNOSIS — C189 Malignant neoplasm of colon, unspecified: Secondary | ICD-10-CM

## 2019-05-17 LAB — BPAM RBC
Blood Product Expiration Date: 202101142359
Blood Product Expiration Date: 202101142359
ISSUE DATE / TIME: 202012170741
ISSUE DATE / TIME: 202012170741
Unit Type and Rh: 5100
Unit Type and Rh: 5100

## 2019-05-17 LAB — TYPE AND SCREEN
ABO/RH(D): O POS
Antibody Screen: NEGATIVE
Unit division: 0
Unit division: 0

## 2019-05-17 NOTE — Patient Instructions (Signed)
Dr Silverio Decamp recommends that you complete a bowel purge (to clean out your bowels). Please do the following: Purchase a bottle of Miralax over the counter as well as a box of 5 mg dulcolax tablets. Take 4 dulcolax tablets. Wait 1 hour. You will then drink 6-8 capfuls of Miralax mixed in an adequate amount of water/juice/gatorade (you may choose which of these liquids to drink) over the next 2-3 hours. You should expect results within 1 to 6 hours after completing the bowel purge.  Take Miralax 1 capful daily  Start benefiber 1 tablespoon twice daily  I appreciate the  opportunity to care for you  Thank You   Harl Bowie , MD

## 2019-05-17 NOTE — Progress Notes (Signed)
Melanie [name]    KI:[id number]    08-Feb-1935  Primary Care Physician:System, Pcp Not In  Referring Physician: Eliezer Bottom, NP [address] [place],  [place] [address]   Chief complaint: Anemia, constipation HPI:  84 year old female with recurrent metastatic here for evaluation of anemia. Complains of worsening constipation, has not had bowel movement in the past 3 days.  She is currently not on any laxatives.  She has generalized abdominal discomfort but denies any pain, vomiting, or rectal bleeding.  No melena.  She has lost 20 to 25 pounds in the past couple months.  Her appetite is decreased. She is currently undergoing chemotherapy  She is s/p left colon resection and reversal of ileostomy, has progressive recurrent metastatic disease Hemoglobin decreased to 7.7 from 11.9 based on labs May 14, 2019.  White count elevated at 18.9 and platelets 440 Abdominal x-ray May 14, 2019 - for bowel obstruction   Outpatient Encounter Medications as of 05/17/2019  Medication Sig  . amlodipine-atorvastatin (CADUET) 10-10 MG tablet Take 1 tablet by mouth daily.  Marland Kitchen aspirin 81 MG chewable tablet Chew by mouth daily.  . Calcium Carbonate-Vitamin D (CALTRATE 600+D PO) Take 1 tablet by mouth at bedtime.  . Cyanocobalamin (VITAMIN B-12 PO) Take 1 tablet by mouth daily.  . ferrous sulfate 325 (65 FE) MG tablet Take 325 mg by mouth daily with breakfast.  . levothyroxine (SYNTHROID) 75 MCG tablet Take 1 tablet (75 mcg total) by mouth daily before breakfast.  . lidocaine-prilocaine (EMLA) cream APPLY TO AFFECTED AREA AS DIRECTED AS NEEDED  . meloxicam (MOBIC) 7.5 MG tablet Take 1 tablet (7.5 mg total) by mouth daily. You MUST take with food!!  . metoprolol tartrate (LOPRESSOR) 25 MG tablet Take 50 mg by mouth 2 (two) times daily.   . Multiple Vitamin (MULTIVITAMIN WITH MINERALS) TABS tablet Take 1 tablet by mouth daily.  . naproxen (EC NAPROSYN) 500 MG EC  tablet Take 500 mg by mouth 2 (two) times daily with a meal.  . ondansetron (ZOFRAN) 4 MG tablet Take 1 tablet (4 mg total) by mouth every 8 (eight) hours as needed for nausea or vomiting.  . potassium chloride SA (K-DUR,KLOR-CON) 20 MEQ tablet Take 1 tablet (20 mEq total) by mouth daily.  . regorafenib (STIVARGA) 40 MG tablet Take 2 tablets daily for 14 days on and 7 days off.  . XELODA 500 MG tablet TAKE 3 TABLETS (1,500 MG TOTAL) BY MOUTH 2 (TWO) TIMES DAILY AFTER A MEAL. TAKE FOR 14 DAYS ON AND 7 DAYS OFF.   No facility-administered encounter medications on file as of 05/17/2019.    Allergies as of 05/17/2019  . (No Known Allergies)    Past Medical History:  Diagnosis Date  . Arthritis   . Chronic thyroiditis   . Colon cancer ([place])    colon  . Colostomy in place [facility])    history of, no longer in place  . Essential hypertension   . Goals of care, counseling/discussion 09/30/2016  . Goiter   . Hypothyroidism     Past Surgical History:  Procedure Laterality Date  . COLON SURGERY  07/2016   colostomy, colorectal cancer, s/p chemotherapy  . COLONOSCOPY    . COLOSTOMY REVERSAL    . IR ANGIOGRAM SELECTIVE EACH ADDITIONAL VESSEL  10/02/2018  . IR ANGIOGRAM SELECTIVE EACH ADDITIONAL VESSEL  10/02/2018  . IR ANGIOGRAM SELECTIVE EACH ADDITIONAL VESSEL  10/02/2018  .  IR ANGIOGRAM VISCERAL SELECTIVE  10/02/2018  . IR ANGIOGRAM VISCERAL SELECTIVE  10/02/2018  . IR ANGIOGRAM VISCERAL SELECTIVE  10/02/2018  . IR RADIOLOGIST EVAL & MGMT  04/27/2017  . IR RADIOLOGIST EVAL & MGMT  07/13/2017  . IR RADIOLOGIST EVAL & MGMT  11/08/2017  . IR RADIOLOGIST EVAL & MGMT  01/02/2018  . IR RADIOLOGIST EVAL & MGMT  05/08/2018  . IR US GUIDE VASC ACCESS RIGHT  10/02/2018  . NEPHRECTOMY  07/2016  . PORTA CATH INSERTION    . RADIOFREQUENCY ABLATION N/A 06/14/2017   Procedure: CT MICROWAVE THERMAL ABLATION;  Surgeon: Aletta Edouard, MD;  Location: [facility];  Service: Anesthesiology;  Laterality: N/A;  . SPLENECTOMY   07/2016    Family History  Problem Relation Age of Onset  . Colon cancer Mother 75       Died at 5 years old  . Heart disease Mother   . Colon cancer Brother 56  . Peripheral vascular disease Father   . Breast cancer Sister   . Diabetes Sister   . Diabetes Brother   . Diabetes Brother   . Esophageal cancer Neg Hx   . Stomach cancer Neg Hx   . Pancreatic cancer Neg Hx     Social History   Socioeconomic History  . Marital status: Widowed    Spouse name: Not on file  . Number of children: 0  . Years of education: 93  . Highest education level: Not on file  Occupational History  . Occupation: retired Therapist, sports carrier  Tobacco Use  . Smoking status: Former Smoker    Packs/day: 1.00    Years: 35.00    Pack years: 35.00    Quit date: 1993    Years since quitting: 27.9  . Smokeless tobacco: Never Used  Substance and Sexual Activity  . Alcohol use: No  . Drug use: No  . Sexual activity: Not on file  Other Topics Concern  . Not on file  Social History Narrative   Lives alone   Retired   Caffeine- 1 cup daily   No children   High school education   Social Determinants of Health   Financial Resource Strain:   . Difficulty of Paying Living Expenses: Not on file  Food Insecurity:   . Worried About Charity fundraiser in the Last Year: Not on file  . Ran Out of Food in the Last Year: Not on file  Transportation Needs:   . Lack of Transportation (Medical): Not on file  . Lack of Transportation (Non-Medical): Not on file  Physical Activity:   . Days of Exercise per Week: Not on file  . Minutes of Exercise per Session: Not on file  Stress:   . Feeling of Stress : Not on file  Social Connections:   . Frequency of Communication with Friends and Family: Not on file  . Frequency of Social Gatherings with Friends and Family: Not on file  . Attends Religious Services: Not on file  . Active Member of Clubs or Organizations: Not on file  . Attends Archivist  Meetings: Not on file  . Marital Status: Not on file  Intimate Partner Violence:   . Fear of Current or Ex-Partner: Not on file  . Emotionally Abused: Not on file  . Physically Abused: Not on file  . Sexually Abused: Not on file      Review of systems: Review of Systems  Constitutional: Negative for fever and chills.  HENT: Negative.  Eyes: Negative for blurred vision.  Respiratory: Negative for cough, shortness of breath and wheezing.   Cardiovascular: Negative for chest pain and palpitations.  Gastrointestinal: as per HPI Genitourinary: Negative for dysuria, urgency, frequency and hematuria.  Musculoskeletal: Negative for myalgias, back pain and joint pain.  Skin: Negative for itching and rash.  Neurological: Negative for dizziness, tremors, focal weakness, seizures and loss of consciousness.  Endo/Heme/Allergies: Positive for seasonal allergies.  Psychiatric/Behavioral: Negative for depression, suicidal ideas and hallucinations.  All other systems reviewed and are negative.   Physical Exam: Vitals:   05/17/19 1003  BP: 138/82  Pulse: 62  Temp: (!) 97.4 F (36.3 C)  SpO2: 98%   Body mass index is 34.41 kg/m. Gen:      No acute distress HEENT:  EOMI, sclera anicteric Neck:     No masses; no thyromegaly Lungs:    Clear to auscultation bilaterally; normal respiratory effort CV:         Regular rate and rhythm; no murmurs Abd:      + bowel sounds; soft, non-tender; no palpable masses, no distension Ext:    No edema; adequate peripheral perfusion Skin:      Warm and dry; no rash Neuro: alert and oriented x 3 Psych: normal mood and affect  Data Reviewed:  Reviewed labs, radiology imaging, old records and pertinent past GI work up   Assessment and Plan/Recommendations:  84 year old very pleasant African-American female with history of descending colon cancer s/p resection with diverting ileostomy and reversal of ileostomy in August 2018.  S/p splenectomy,  nephrectomy, CKD, hypothyroidism with recurrent metastatic progressive colon cancer with prominent mediastinal and mesenteric lymph nodes, numerous pulmonary nodules and necrotic liver mass based on CT October 2020  Patient denies any overt GI bleeding, no melena or rectal bleeding MCV elevated at 103, normal iron saturation and ferritin.  No evidence of iron deficiency. Anemia is multifactorial, does not appear GI blood loss is playing a major role here based on her presentation. Will hold off diagnostic endoscopic evaluation unless is needed for therapeutic intervention in the setting of active GI hemorrhage Continue to monitor H&H, supportive care per oncology  Constipation: Evidence of increased stool burden on abdominal x-ray, will do bowel purge with MiraLAX After completion of bowel purge start Benefiber 1 tablespoon twice daily and MiraLAX 1 capful daily Increase fluid intake  Return in 2 to 3 months or sooner if needed  Greater than 50% of the time used for counseling as well as treatment plan and follow-up. She and her sister had multiple questions which were answered to their satisfaction  K. Denzil Magnuson , MD    CC: [place], Holli Humbles, NP

## 2019-05-21 ENCOUNTER — Other Ambulatory Visit: Payer: Self-pay | Admitting: Hematology & Oncology

## 2019-05-30 ENCOUNTER — Other Ambulatory Visit: Payer: Medicare Other

## 2019-05-30 ENCOUNTER — Ambulatory Visit: Payer: Medicare Other | Admitting: Hematology & Oncology

## 2019-06-05 ENCOUNTER — Inpatient Hospital Stay: Payer: Medicare Other

## 2019-06-05 ENCOUNTER — Inpatient Hospital Stay: Payer: Medicare Other | Attending: Hematology & Oncology

## 2019-06-05 ENCOUNTER — Other Ambulatory Visit: Payer: Self-pay

## 2019-06-05 ENCOUNTER — Inpatient Hospital Stay (HOSPITAL_BASED_OUTPATIENT_CLINIC_OR_DEPARTMENT_OTHER): Payer: Medicare Other | Admitting: Hematology & Oncology

## 2019-06-05 ENCOUNTER — Encounter: Payer: Self-pay | Admitting: Hematology & Oncology

## 2019-06-05 VITALS — BP 142/69 | HR 66 | Temp 97.1°F | Resp 17 | Wt 185.0 lb

## 2019-06-05 DIAGNOSIS — C186 Malignant neoplasm of descending colon: Secondary | ICD-10-CM

## 2019-06-05 DIAGNOSIS — K921 Melena: Secondary | ICD-10-CM | POA: Diagnosis not present

## 2019-06-05 DIAGNOSIS — C787 Secondary malignant neoplasm of liver and intrahepatic bile duct: Secondary | ICD-10-CM | POA: Insufficient documentation

## 2019-06-05 DIAGNOSIS — D649 Anemia, unspecified: Secondary | ICD-10-CM | POA: Insufficient documentation

## 2019-06-05 DIAGNOSIS — M7989 Other specified soft tissue disorders: Secondary | ICD-10-CM | POA: Diagnosis not present

## 2019-06-05 DIAGNOSIS — D500 Iron deficiency anemia secondary to blood loss (chronic): Secondary | ICD-10-CM

## 2019-06-05 DIAGNOSIS — L539 Erythematous condition, unspecified: Secondary | ICD-10-CM | POA: Insufficient documentation

## 2019-06-05 DIAGNOSIS — C189 Malignant neoplasm of colon, unspecified: Secondary | ICD-10-CM

## 2019-06-05 DIAGNOSIS — E032 Hypothyroidism due to medicaments and other exogenous substances: Secondary | ICD-10-CM

## 2019-06-05 DIAGNOSIS — Z79899 Other long term (current) drug therapy: Secondary | ICD-10-CM | POA: Insufficient documentation

## 2019-06-05 DIAGNOSIS — M2550 Pain in unspecified joint: Secondary | ICD-10-CM | POA: Diagnosis not present

## 2019-06-05 LAB — CMP (CANCER CENTER ONLY)
ALT: 10 U/L (ref 0–44)
AST: 38 U/L (ref 15–41)
Albumin: 3.4 g/dL — ABNORMAL LOW (ref 3.5–5.0)
Alkaline Phosphatase: 240 U/L — ABNORMAL HIGH (ref 38–126)
Anion gap: 7 (ref 5–15)
BUN: 16 mg/dL (ref 8–23)
CO2: 24 mmol/L (ref 22–32)
Calcium: 9.6 mg/dL (ref 8.9–10.3)
Chloride: 106 mmol/L (ref 98–111)
Creatinine: 1.32 mg/dL — ABNORMAL HIGH (ref 0.44–1.00)
GFR, Est AFR Am: 43 mL/min — ABNORMAL LOW (ref >60)
GFR, Estimated: 37 mL/min — ABNORMAL LOW (ref >60)
Glucose, Bld: 101 mg/dL — ABNORMAL HIGH (ref 70–99)
Potassium: 3.8 mmol/L (ref 3.5–5.1)
Sodium: 137 mmol/L (ref 135–145)
Total Bilirubin: 0.7 mg/dL (ref 0.3–1.2)
Total Protein: 7.2 g/dL (ref 6.5–8.1)

## 2019-06-05 LAB — CBC WITH DIFFERENTIAL (CANCER CENTER ONLY)
Abs Immature Granulocytes: 0.05 10*3/uL (ref 0.00–0.07)
Basophils Absolute: 0.1 10*3/uL (ref 0.0–0.1)
Basophils Relative: 1 %
Eosinophils Absolute: 0.4 10*3/uL (ref 0.0–0.5)
Eosinophils Relative: 4 %
HCT: 37 % (ref 36.0–46.0)
Hemoglobin: 11.8 g/dL — ABNORMAL LOW (ref 12.0–15.0)
Immature Granulocytes: 0 %
Lymphocytes Relative: 12 %
Lymphs Abs: 1.4 10*3/uL (ref 0.7–4.0)
MCH: 29.9 pg (ref 26.0–34.0)
MCHC: 31.9 g/dL (ref 30.0–36.0)
MCV: 93.7 fL (ref 80.0–100.0)
Monocytes Absolute: 1.2 10*3/uL — ABNORMAL HIGH (ref 0.1–1.0)
Monocytes Relative: 10 %
Neutro Abs: 8.8 10*3/uL — ABNORMAL HIGH (ref 1.7–7.7)
Neutrophils Relative %: 73 %
Platelet Count: 275 10*3/uL (ref 150–400)
RBC: 3.95 MIL/uL (ref 3.87–5.11)
RDW: 17.4 % — ABNORMAL HIGH (ref 11.5–15.5)
WBC Count: 12 10*3/uL — ABNORMAL HIGH (ref 4.0–10.5)
nRBC: 0 % (ref 0.0–0.2)

## 2019-06-05 MED ORDER — SODIUM CHLORIDE 0.9% FLUSH
10.0000 mL | Freq: Once | INTRAVENOUS | Status: AC
  Administered 2019-06-05: 10 mL via INTRAVENOUS
  Filled 2019-06-05: qty 10

## 2019-06-05 MED ORDER — HEPARIN SOD (PORK) LOCK FLUSH 100 UNIT/ML IV SOLN
500.0000 [IU] | Freq: Once | INTRAVENOUS | Status: AC
  Administered 2019-06-05: 500 [IU] via INTRAVENOUS
  Filled 2019-06-05: qty 5

## 2019-06-05 NOTE — Patient Instructions (Signed)

## 2019-06-05 NOTE — Addendum Note (Signed)
Addended by: Shelda Altes on: 06/05/2019 04:05 PM   Modules accepted: Orders

## 2019-06-05 NOTE — Progress Notes (Signed)
Hematology and Oncology Follow Up Visit  Melanie [name] [id number] 1934/08/20 85 y.o. 06/05/2019   Principle Diagnosis:  Metastatic colon cancer -- progressive mets -- NO actionable mutation/LOW TMB/ MSI stable/ KRAS (+)/HER2-  Past Therapy: FOLFOX q 14 days s/p cycle 3 - DC'd secondary to non-tolerance RFA surgery - January 2019 Xeloda 1500 mg po BID (14/7) -- s/p c#4- start on 11/20/2018  -- d/c on 02/28/2019 Avastin 7.5 mg/kg IV q 3 week -- S/p c#4 -started on 06//23/2020 -- d/c on 02/28/2019  Current Therapy:   Stivarga 80 mg po q day (14 on/7 off) -- start on 04/24/2019   Interim History:  Melanie [name] is here today for follow-up.  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  I suspect this probably is from the Melanie [name].  Her right hand clearly is much more swollen than the left hand.  I told her to stop the Stivarga right now.  Going to have to see about holding the Stivarga for an extra week or so and then restarting the Stivarga at a lower dose.  Looks like the Melanie [name] is working.  Her CEA level was down to 77 when we saw her in December.  She did have heme positive stools and marked anemia when we saw her in December.  She was seen by gastroenterology.  They felt that she could just be watched for right now.  She is eating okay.  She did have a nice Christmas and New Year's holiday.  She has had no nausea or vomiting.  She has had no cough.  There is no fever.  As always, her sister listened on the phone.  Currently, her performance status is ECOG 2.    Medications:  Allergies as of 06/05/2019   No Known Allergies     Medication List       Accurate as of June 05, 2019  3:36 PM. If you have any questions, ask your nurse or doctor.        amlodipine-atorvastatin 10-10 MG tablet Commonly known as: CADUET Take 1 tablet by mouth daily.   aspirin 81 MG chewable  tablet Chew by mouth daily.   CALTRATE 600+D PO Take 1 tablet by mouth at bedtime.   ferrous sulfate 325 (65 FE) MG tablet Take 325 mg by mouth daily with breakfast.   levothyroxine 75 MCG tablet Commonly known as: SYNTHROID Take 1 tablet (75 mcg total) by mouth daily before breakfast.   lidocaine-prilocaine cream Commonly known as: EMLA APPLY TO AFFECTED AREA AS DIRECTED AS NEEDED   meloxicam 7.5 MG tablet Commonly known as: Mobic Take 1 tablet (7.5 mg total) by mouth daily. You MUST take with food!!   metoprolol tartrate 25 MG tablet Commonly known as: LOPRESSOR Take 50 mg by mouth 2 (two) times daily.   multivitamin with minerals Tabs tablet Take 1 tablet by mouth daily.   naproxen 500 MG EC tablet Commonly known as: EC NAPROSYN Take 500 mg by mouth 2 (two) times daily with a meal.   ondansetron 4 MG tablet Commonly known as: Zofran Take 1 tablet (4 mg total) by mouth every 8 (eight) hours as needed for nausea or vomiting.   potassium chloride SA 20 MEQ tablet Commonly known as: KLOR-CON Take 1 tablet (20 mEq total) by mouth daily.   regorafenib 40 MG tablet Commonly known as: STIVARGA Take 2 tablets daily for 14 days on and  7 days off.   VITAMIN B-12 PO Take 1 tablet by mouth daily.   Xeloda 500 MG tablet Generic drug: capecitabine TAKE 3 TABLETS (1,500 MG TOTAL) BY MOUTH 2 (TWO) TIMES DAILY AFTER A MEAL. TAKE FOR 14 DAYS ON AND 7 DAYS OFF.       Allergies: No Known Allergies  Past Medical History, Surgical history, Social history, and Family History were reviewed and updated.  Review of Systems: Review of Systems  Constitutional: Negative.   HENT: Negative.   Eyes: Negative.   Respiratory: Negative.   Cardiovascular: Negative.   Gastrointestinal: Negative.   Genitourinary: Negative.   Musculoskeletal: Positive for joint pain.  Skin: Negative.   Neurological: Negative.   Endo/Heme/Allergies: Negative.   Psychiatric/Behavioral: Negative.      Physical Exam:  weight is 185 lb (83.9 kg). Her temporal temperature is 97.1 F (36.2 C) (abnormal). Her blood pressure is 142/69 (abnormal) and her pulse is 66. Her respiration is 17 and oxygen saturation is 99%.   Wt Readings from Last 3 Encounters:  06/05/19 185 lb (83.9 kg)  05/17/19 194 lb 4 oz (88.1 kg)  05/14/19 183 lb 4 oz (83.1 kg)    Physical Exam Vitals reviewed.  HENT:     Head: Normocephalic and atraumatic.  Eyes:     Pupils: Pupils are equal, round, and reactive to light.  Cardiovascular:     Rate and Rhythm: Normal rate and regular rhythm.     Heart sounds: Normal heart sounds.  Pulmonary:     Effort: Pulmonary effort is normal.     Breath sounds: Normal breath sounds.  Abdominal:     General: Bowel sounds are normal.     Palpations: Abdomen is soft.     Comments: Her abdomen is soft.  She has decent bowel sounds.  She has well-healed laparotomy scar.  She has no fluid wave.  There is no guarding or rebound tenderness.  There is no liver or spleen tip.  Musculoskeletal:        General: No tenderness or deformity. Normal range of motion.     Cervical back: Normal range of motion.     Comments: She has marked swelling and some erythema in the right hand.  This is the dorsum of the right hand.  There is tenderness to palpation.  She has decreased range of motion of her fingers.  There is also decreased range of motion of the wrist.  She has some slight swelling of the left hand.  She has decreased range of motion of the fingers.  She has some tenderness to palpation on the dorsum of the left hand.  Lymphadenopathy:     Cervical: No cervical adenopathy.  Skin:    General: Skin is warm and dry.     Findings: No erythema or rash.  Neurological:     Mental Status: She is alert and oriented to person, place, and time.  Psychiatric:        Behavior: Behavior normal.        Thought Content: Thought content normal.        Judgment: Judgment normal.      Lab  Results  Component Value Date   WBC 12.0 (H) 06/05/2019   HGB 11.8 (L) 06/05/2019   HCT 37.0 06/05/2019   MCV 93.7 06/05/2019   PLT 275 06/05/2019   Lab Results  Component Value Date   FERRITIN 252 05/14/2019   IRON 93 05/14/2019   TIBC 255 05/14/2019   UIBC 162  05/14/2019   IRONPCTSAT 36 05/14/2019   Lab Results  Component Value Date   RETICCTPCT 1.4 09/04/2018   RBC 3.95 06/05/2019   No results found for: KPAFRELGTCHN, LAMBDASER, KAPLAMBRATIO No results found for: IGGSERUM, IGA, IGMSERUM No results found for: Kathrynn Ducking, MSPIKE, SPEI   Chemistry      Component Value Date/Time   NA 137 06/05/2019 1430   NA 144 04/26/2017 0830   NA 137 09/30/2016 0940   K 3.8 06/05/2019 1430   K 4.1 04/26/2017 0830   K 4.7 09/30/2016 0940   CL 106 06/05/2019 1430   CL 108 04/26/2017 0830   CO2 24 06/05/2019 1430   CO2 27 04/26/2017 0830   CO2 18 (L) 09/30/2016 0940   BUN 16 06/05/2019 1430   BUN 25 (H) 04/26/2017 0830   BUN 21.5 09/30/2016 0940   CREATININE 1.32 (H) 06/05/2019 1430   CREATININE 1.5 (H) 04/26/2017 0830   CREATININE 1.8 (H) 09/30/2016 0940      Component Value Date/Time   CALCIUM 9.6 06/05/2019 1430   CALCIUM 9.9 04/26/2017 0830   CALCIUM 10.4 09/30/2016 0940   ALKPHOS 240 (H) 06/05/2019 1430   ALKPHOS 124 (H) 04/26/2017 0830   ALKPHOS 147 09/30/2016 0940   AST 38 06/05/2019 1430   AST 27 09/30/2016 0940   ALT 10 06/05/2019 1430   ALT 16 04/26/2017 0830   ALT 11 09/30/2016 0940   BILITOT 0.7 06/05/2019 1430   BILITOT 0.34 09/30/2016 0940       Impression and Plan: Ms. Overall is a very pleasant 85 yo African American female with adenocarcinoma of the descending colon that has recurred and is now metastatic.   Again, we are clearly dealing with quality of life issues.  I just want to see her quality life better.  Maybe, by holding the Stivarga for an extra week she will have some improvement in the  inflammatory arthropathy.  She is on Mobic but not taking it.  I told her to make sure she takes it daily with food.  We really going to have to get some scans on her.  I think, that at least by her CEA, that she is responding.  I spent about 35 minutes with her today.  All the time spent face-to-face.  I helped with the arthropathy.  We placed an ice pack on her right hand.  I answered her sister's questions.  We will plan to get her back in a month.  We will get the scans when we see her back.  I gave her and her sisters the instructions for restarting the Stivarga.  I told him to restart the Stivarga on January 18 at 40 mg a day for 1 week and then go up to 80 mg a day for the next week.     Melanie Napoleon, MD 1/6/20213:36 PM

## 2019-06-06 ENCOUNTER — Telehealth: Payer: Self-pay | Admitting: Hematology & Oncology

## 2019-06-06 LAB — CEA (IN HOUSE-CHCC): CEA (CHCC-In House): 67.42 ng/mL — ABNORMAL HIGH (ref 0.00–5.00)

## 2019-06-06 LAB — TSH: TSH: 9.63 u[IU]/mL — ABNORMAL HIGH (ref 0.308–3.960)

## 2019-06-06 NOTE — Telephone Encounter (Signed)
Called and spoke with patients sister regarding appointments added.  She will stop by and pick up contrast prior to CT scans/  She will get appt info from My Chart per 1/6 los

## 2019-06-07 ENCOUNTER — Telehealth: Payer: Self-pay | Admitting: *Deleted

## 2019-06-07 NOTE — Telephone Encounter (Signed)
As noted below by Dr. Marin Olp, I informed the patient that the tumor level is down to 66. The pills are working. She verbalized understanding.

## 2019-06-07 NOTE — Telephone Encounter (Signed)
-----   Message from Melanie Napoleon, MD sent at 06/06/2019  4:04 PM EST ----- Call - the tumor level is down to 66!!!  The pills are still working. Melanie [name]

## 2019-06-12 ENCOUNTER — Other Ambulatory Visit: Payer: Self-pay | Admitting: Hematology & Oncology

## 2019-06-18 ENCOUNTER — Other Ambulatory Visit: Payer: Self-pay | Admitting: *Deleted

## 2019-06-18 MED ORDER — MELOXICAM 7.5 MG PO TABS: 7.5000 mg | ORAL_TABLET | Freq: Every day | ORAL | 3 refills | Status: DC

## 2019-07-08 ENCOUNTER — Other Ambulatory Visit: Payer: Self-pay | Admitting: *Deleted

## 2019-07-08 DIAGNOSIS — C186 Malignant neoplasm of descending colon: Secondary | ICD-10-CM

## 2019-07-08 DIAGNOSIS — D500 Iron deficiency anemia secondary to blood loss (chronic): Secondary | ICD-10-CM

## 2019-07-08 MED ORDER — LEVOTHYROXINE SODIUM 75 MCG PO TABS: 75.0000 ug | ORAL_TABLET | Freq: Every day | ORAL | 2 refills | Status: DC

## 2019-07-10 ENCOUNTER — Inpatient Hospital Stay: Payer: Medicare Other

## 2019-07-10 ENCOUNTER — Ambulatory Visit (HOSPITAL_BASED_OUTPATIENT_CLINIC_OR_DEPARTMENT_OTHER)
Admission: RE | Admit: 2019-07-10 | Discharge: 2019-07-10 | Disposition: A | Discharge status: Home / Self Care | Payer: Medicare Other | Source: Ambulatory Visit | Attending: Hematology & Oncology | Admitting: Hematology & Oncology

## 2019-07-10 ENCOUNTER — Inpatient Hospital Stay: Payer: Medicare Other | Attending: Hematology & Oncology

## 2019-07-10 ENCOUNTER — Other Ambulatory Visit: Payer: Self-pay

## 2019-07-10 ENCOUNTER — Telehealth: Payer: Self-pay | Admitting: Hematology & Oncology

## 2019-07-10 ENCOUNTER — Encounter: Payer: Self-pay | Admitting: Hematology & Oncology

## 2019-07-10 ENCOUNTER — Inpatient Hospital Stay (HOSPITAL_BASED_OUTPATIENT_CLINIC_OR_DEPARTMENT_OTHER): Payer: Medicare Other | Admitting: Hematology & Oncology

## 2019-07-10 ENCOUNTER — Encounter (HOSPITAL_BASED_OUTPATIENT_CLINIC_OR_DEPARTMENT_OTHER): Payer: Self-pay

## 2019-07-10 VITALS — BP 192/108 | HR 70 | Temp 97.5°F | Resp 20 | Wt 184.0 lb

## 2019-07-10 DIAGNOSIS — C186 Malignant neoplasm of descending colon: Secondary | ICD-10-CM | POA: Insufficient documentation

## 2019-07-10 DIAGNOSIS — C7800 Secondary malignant neoplasm of unspecified lung: Secondary | ICD-10-CM | POA: Diagnosis not present

## 2019-07-10 DIAGNOSIS — C787 Secondary malignant neoplasm of liver and intrahepatic bile duct: Secondary | ICD-10-CM | POA: Insufficient documentation

## 2019-07-10 DIAGNOSIS — C189 Malignant neoplasm of colon, unspecified: Secondary | ICD-10-CM

## 2019-07-10 DIAGNOSIS — L539 Erythematous condition, unspecified: Secondary | ICD-10-CM | POA: Diagnosis not present

## 2019-07-10 DIAGNOSIS — Z5111 Encounter for antineoplastic chemotherapy: Secondary | ICD-10-CM | POA: Insufficient documentation

## 2019-07-10 DIAGNOSIS — Z95828 Presence of other vascular implants and grafts: Secondary | ICD-10-CM

## 2019-07-10 DIAGNOSIS — Z79899 Other long term (current) drug therapy: Secondary | ICD-10-CM | POA: Insufficient documentation

## 2019-07-10 DIAGNOSIS — M2550 Pain in unspecified joint: Secondary | ICD-10-CM | POA: Insufficient documentation

## 2019-07-10 LAB — CMP (CANCER CENTER ONLY)
ALT: 26 U/L (ref 0–44)
AST: 87 U/L — ABNORMAL HIGH (ref 15–41)
Albumin: 3.4 g/dL — ABNORMAL LOW (ref 3.5–5.0)
Alkaline Phosphatase: 432 U/L — ABNORMAL HIGH (ref 38–126)
Anion gap: 9 (ref 5–15)
BUN: 17 mg/dL (ref 8–23)
CO2: 25 mmol/L (ref 22–32)
Calcium: 9.8 mg/dL (ref 8.9–10.3)
Chloride: 103 mmol/L (ref 98–111)
Creatinine: 1.23 mg/dL — ABNORMAL HIGH (ref 0.44–1.00)
GFR, Est AFR Am: 47 mL/min — ABNORMAL LOW (ref >60)
GFR, Estimated: 41 mL/min — ABNORMAL LOW (ref >60)
Glucose, Bld: 104 mg/dL — ABNORMAL HIGH (ref 70–99)
Potassium: 3.8 mmol/L (ref 3.5–5.1)
Sodium: 137 mmol/L (ref 135–145)
Total Bilirubin: 0.6 mg/dL (ref 0.3–1.2)
Total Protein: 8.3 g/dL — ABNORMAL HIGH (ref 6.5–8.1)

## 2019-07-10 LAB — CBC WITH DIFFERENTIAL (CANCER CENTER ONLY)
Abs Immature Granulocytes: 0.03 10*3/uL (ref 0.00–0.07)
Basophils Absolute: 0.1 10*3/uL (ref 0.0–0.1)
Basophils Relative: 1 %
Eosinophils Absolute: 0.8 10*3/uL — ABNORMAL HIGH (ref 0.0–0.5)
Eosinophils Relative: 8 %
HCT: 39.6 % (ref 36.0–46.0)
Hemoglobin: 13.1 g/dL (ref 12.0–15.0)
Immature Granulocytes: 0 %
Lymphocytes Relative: 13 %
Lymphs Abs: 1.4 10*3/uL (ref 0.7–4.0)
MCH: 28.9 pg (ref 26.0–34.0)
MCHC: 33.1 g/dL (ref 30.0–36.0)
MCV: 87.4 fL (ref 80.0–100.0)
Monocytes Absolute: 1.1 10*3/uL — ABNORMAL HIGH (ref 0.1–1.0)
Monocytes Relative: 11 %
Neutro Abs: 6.7 10*3/uL (ref 1.7–7.7)
Neutrophils Relative %: 67 %
Platelet Count: 275 10*3/uL (ref 150–400)
RBC: 4.53 MIL/uL (ref 3.87–5.11)
RDW: 17.1 % — ABNORMAL HIGH (ref 11.5–15.5)
WBC Count: 10 10*3/uL (ref 4.0–10.5)
nRBC: 0 % (ref 0.0–0.2)

## 2019-07-10 LAB — SAMPLE TO BLOOD BANK

## 2019-07-10 MED ORDER — IOHEXOL 300 MG/ML  SOLN
100.0000 mL | Freq: Once | INTRAMUSCULAR | Status: AC | PRN
  Administered 2019-07-10: 80 mL via INTRAVENOUS

## 2019-07-10 MED ORDER — SODIUM CHLORIDE 0.9% FLUSH
10.0000 mL | Freq: Once | INTRAVENOUS | Status: AC
  Administered 2019-07-10: 10 mL via INTRAVENOUS
  Filled 2019-07-10: qty 10

## 2019-07-10 MED ORDER — HEPARIN SOD (PORK) LOCK FLUSH 100 UNIT/ML IV SOLN
500.0000 [IU] | Freq: Once | INTRAVENOUS | Status: AC
  Administered 2019-07-10: 500 [IU] via INTRAVENOUS
  Filled 2019-07-10: qty 5

## 2019-07-10 NOTE — Telephone Encounter (Signed)
Called and LMVM for patient's sister regarding appointments added and I also mailed a calendar per 2/10 los

## 2019-07-10 NOTE — Progress Notes (Signed)
Hematology and Oncology Follow Up Visit  Melanie [name] [id number] May 07, 1935 85 y.o. 07/10/2019   Principle Diagnosis:  Metastatic colon cancer -- progressive mets -- NO actionable mutation/LOW TMB/ MSI stable/ KRAS (+)/HER2-  Past Therapy: FOLFOX q 14 days s/p cycle 3 - DC'd secondary to non-tolerance RFA surgery - January 2019 Xeloda 1500 mg po BID (14/7) -- s/p c#4- start on 11/20/2018  -- d/c on 02/28/2019 Avastin 7.5 mg/kg IV q 3 week -- S/p c#4 -started on 06//23/2020 -- d/c on 02/28/2019  Current Therapy:   Stivarga 80 mg po q day (14 on/7 off) -- start on 04/24/2019  -- d/c on 07/10/2019 due to progression  FOLFIRI -- start cycle #1 on 07/24/2019   Interim History:  Melanie [name] is here today for follow-up.  Unfortunately, we now have progressive disease again.  We had a CT scan done on her today.  The CT scan showed progressive pulmonary metastasis.  She has larger metastasis and more numerous.  She also has a larger primary liver metastasis.  Her last CEA level was actually down to 67.  This had been improving.  I think that we still have one final chance at try to help her.  I know that she had a horrible time with FOLFOX about 3 years ago.  I will try her on FOLFIRI.  This actually may help.  We will not be able to give her full dose of treatment by any means.  Her blood pressure is on the high side.  I probably would hold on a VEGF inhibitor for right now.  Her performance status is still pretty good.  She has a good performance status of ECOG 1.  Her appetite is all right.  She has had no nausea or vomiting.  She has had no cough.  She has had no change in bowel or bladder habits.  There has been no fever.  She has had no bleeding.  She still awaiting her COVID vaccine.  She actually may go back to [facility] to see if she can get her vaccine there.  She has had no headache.  Again, her overall performance status is ECOG 1.    Medications:    Allergies as of 07/10/2019   No Known Allergies     Medication List       Accurate as of July 10, 2019  1:20 PM. If you have any questions, ask your nurse or doctor.        STOP taking these medications   Xeloda 500 MG tablet Generic drug: capecitabine Stopped by: [name], MD     TAKE these medications   amlodipine-atorvastatin 10-10 MG tablet Commonly known as: CADUET Take 1 tablet by mouth daily.   aspirin 81 MG chewable tablet Chew by mouth daily.   CALTRATE 600+D PO Take 1 tablet by mouth at bedtime.   ferrous sulfate 325 (65 FE) MG tablet Take 325 mg by mouth daily with breakfast.   levothyroxine 75 MCG tablet Commonly known as: SYNTHROID Take 1 tablet (75 mcg total) by mouth daily before breakfast.   lidocaine-prilocaine cream Commonly known as: EMLA APPLY TO AFFECTED AREA AS DIRECTED AS NEEDED   meloxicam 7.5 MG tablet Commonly known as: Mobic Take 1 tablet (7.5 mg total) by mouth daily. You MUST take with food!!   metoprolol tartrate 25 MG tablet Commonly known as: LOPRESSOR Take 50 mg by mouth 2 (two) times daily.   multivitamin with minerals Tabs tablet Take 1 tablet  by mouth daily.   naproxen 500 MG EC tablet Commonly known as: EC NAPROSYN Take 500 mg by mouth 2 (two) times daily with a meal.   ondansetron 4 MG tablet Commonly known as: Zofran Take 1 tablet (4 mg total) by mouth every 8 (eight) hours as needed for nausea or vomiting.   potassium chloride SA 20 MEQ tablet Commonly known as: KLOR-CON Take 1 tablet (20 mEq total) by mouth daily.   regorafenib 40 MG tablet Commonly known as: STIVARGA Take 2 tablets daily for 14 days on and 7 days off.   VITAMIN B-12 PO Take 1 tablet by mouth daily.       Allergies: No Known Allergies  Past Medical History, Surgical history, Social history, and Family History were reviewed and updated.  Review of Systems: Review of Systems  Constitutional: Negative.   HENT: Negative.    Eyes: Negative.   Respiratory: Negative.   Cardiovascular: Negative.   Gastrointestinal: Negative.   Genitourinary: Negative.   Musculoskeletal: Positive for joint pain.  Skin: Negative.   Neurological: Negative.   Endo/Heme/Allergies: Negative.   Psychiatric/Behavioral: Negative.     Physical Exam:  weight is 184 lb 0.2 oz (83.5 kg). Her temporal temperature is 97.5 F (36.4 C) (abnormal). Her blood pressure is 192/108 (abnormal) and her pulse is 70. Her respiration is 20 and oxygen saturation is 95%.   Wt Readings from Last 3 Encounters:  07/10/19 184 lb 0.2 oz (83.5 kg)  06/05/19 185 lb (83.9 kg)  05/17/19 194 lb 4 oz (88.1 kg)    Physical Exam Vitals reviewed.  HENT:     Head: Normocephalic and atraumatic.  Eyes:     Pupils: Pupils are equal, round, and reactive to light.  Cardiovascular:     Rate and Rhythm: Normal rate and regular rhythm.     Heart sounds: Normal heart sounds.  Pulmonary:     Effort: Pulmonary effort is normal.     Breath sounds: Normal breath sounds.  Abdominal:     General: Bowel sounds are normal.     Palpations: Abdomen is soft.     Comments: Her abdomen is soft.  She has decent bowel sounds.  She has well-healed laparotomy scar.  She has no fluid wave.  There is no guarding or rebound tenderness.  There is no liver or spleen tip.  Musculoskeletal:        General: No tenderness or deformity. Normal range of motion.     Cervical back: Normal range of motion.     Comments: She has marked swelling and some erythema in the right hand.  This is the dorsum of the right hand.  There is tenderness to palpation.  She has decreased range of motion of her fingers.  There is also decreased range of motion of the wrist.  She has some slight swelling of the left hand.  She has decreased range of motion of the fingers.  She has some tenderness to palpation on the dorsum of the left hand.  Lymphadenopathy:     Cervical: No cervical adenopathy.  Skin:     General: Skin is warm and dry.     Findings: No erythema or rash.  Neurological:     Mental Status: She is alert and oriented to person, place, and time.  Psychiatric:        Behavior: Behavior normal.        Thought Content: Thought content normal.        Judgment: Judgment normal.  Lab Results  Component Value Date   WBC 10.0 07/10/2019   HGB 13.1 07/10/2019   HCT 39.6 07/10/2019   MCV 87.4 07/10/2019   PLT 275 07/10/2019   Lab Results  Component Value Date   FERRITIN 252 05/14/2019   IRON 93 05/14/2019   TIBC 255 05/14/2019   UIBC 162 05/14/2019   IRONPCTSAT 36 05/14/2019   Lab Results  Component Value Date   RETICCTPCT 1.4 09/04/2018   RBC 4.53 07/10/2019   No results found for: KPAFRELGTCHN, LAMBDASER, KAPLAMBRATIO No results found for: IGGSERUM, IGA, IGMSERUM No results found for: Kathrynn Ducking, MSPIKE, SPEI   Chemistry      Component Value Date/Time   NA 137 07/10/2019 1131   NA 144 04/26/2017 0830   NA 137 09/30/2016 0940   K 3.8 07/10/2019 1131   K 4.1 04/26/2017 0830   K 4.7 09/30/2016 0940   CL 103 07/10/2019 1131   CL 108 04/26/2017 0830   CO2 25 07/10/2019 1131   CO2 27 04/26/2017 0830   CO2 18 (L) 09/30/2016 0940   BUN 17 07/10/2019 1131   BUN 25 (H) 04/26/2017 0830   BUN 21.5 09/30/2016 0940   CREATININE 1.23 (H) 07/10/2019 1131   CREATININE 1.5 (H) 04/26/2017 0830   CREATININE 1.8 (H) 09/30/2016 0940      Component Value Date/Time   CALCIUM 9.8 07/10/2019 1131   CALCIUM 9.9 04/26/2017 0830   CALCIUM 10.4 09/30/2016 0940   ALKPHOS 432 (H) 07/10/2019 1131   ALKPHOS 124 (H) 04/26/2017 0830   ALKPHOS 147 09/30/2016 0940   AST 87 (H) 07/10/2019 1131   AST 27 09/30/2016 0940   ALT 26 07/10/2019 1131   ALT 16 04/26/2017 0830   ALT 11 09/30/2016 0940   BILITOT 0.6 07/10/2019 1131   BILITOT 0.34 09/30/2016 0940       Impression and Plan: Ms. Sheller is a very pleasant 85 yo African  American female with adenocarcinoma of the descending colon that has recurred and is now metastatic.   I know that we are dealing with quality of life issues.  She still has a good performance status.  She is contemplating 1 further treatment protocol.  We will try her on FOLFIRI.  I would dose her every 3 weeks and not 2 weeks.  We will also decrease the dose a little bit.  Hopefully, we will see a response.  I would like to hope that we will get a response with FOLFIRI.  I would give her 3 cycles of treatment.  I am pretty sure that the CEA level will tell us how things are going.  If she has any problems, we will definitely stop treatment on her and consider hospice.  I spent about 45 minutes with she and her sister, who is on the cell phone.  I explained everything.  I went over her CT scan.  I went over her labs.  We will have her come back in 2 weeks.  We will start treatment then if she is still willing.      [name], MD 2/10/20211:20 PM

## 2019-07-10 NOTE — Patient Instructions (Signed)

## 2019-07-10 NOTE — Progress Notes (Signed)
DISCONTINUE ON PATHWAY REGIMEN - Colorectal     A cycle is every 21 days:     Irinotecan      Capecitabine      Bevacizumab-xxxx   **Always confirm dose/schedule in your pharmacy ordering system**  REASON: Disease Progression PRIOR TREATMENT: [id number]: XELIRI + Bevacizumab TREATMENT RESPONSE: Partial Response (PR)  START OFF PATHWAY REGIMEN - Colorectal   [id number]:FOLFIRI + ziv-aflibercept (q14d)  **2 cycles per order sheet**:   A cycle is every 14 days:     Ziv-aflibercept      Irinotecan      Leucovorin      Fluorouracil      Fluorouracil   **Always confirm dose/schedule in your pharmacy ordering system**  Patient Characteristics: Distant Metastases, Nonsurgical Candidate, KRAS/NRAS Mutation Positive/Unknown (BRAF V600 Wild-Type/Unknown), Standard Cytotoxic Therapy, Third Line Standard Cytotoxic Therapy Tumor Location: Colon Therapeutic Status: Distant Metastases Microsatellite/Mismatch Repair Status: MSS/pMMR BRAF Mutation Status: Wild-Type (no mutation) KRAS/NRAS Mutation Status: Mutation Positive Standard Cytotoxic Line of Therapy: Third Building services engineer Cytotoxic Therapy Intent of Therapy: Non-Curative / Palliative Intent, Discussed with Patient

## 2019-07-11 LAB — FERRITIN: Ferritin: 488 ng/mL — ABNORMAL HIGH (ref 11–307)

## 2019-07-11 LAB — IRON AND TIBC
Iron: 62 ug/dL (ref 41–142)
Saturation Ratios: 28 % (ref 21–57)
TIBC: 226 ug/dL — ABNORMAL LOW (ref 236–444)
UIBC: 164 ug/dL (ref 120–384)

## 2019-07-11 LAB — CEA (IN HOUSE-CHCC): CEA (CHCC-In House): 48.84 ng/mL — ABNORMAL HIGH (ref 0.00–5.00)

## 2019-07-12 ENCOUNTER — Encounter: Payer: Self-pay | Admitting: Hematology & Oncology

## 2019-07-12 ENCOUNTER — Ambulatory Visit: Payer: Medicare Other | Attending: Internal Medicine

## 2019-07-12 DIAGNOSIS — Z23 Encounter for immunization: Secondary | ICD-10-CM | POA: Insufficient documentation

## 2019-07-12 NOTE — Progress Notes (Signed)
   Covid-19 Vaccination Clinic  Name:  Melanie [name]    MRN: [id number] DOB: Nov 22, 1934  07/12/2019  Melanie [name] was observed post Covid-19 immunization for 15 minutes without incidence. She was provided with Vaccine Information Sheet and instruction to access the V-Safe system.   Melanie [name] was instructed to call 911 with any severe reactions post vaccine: Marland Kitchen Difficulty breathing  . Swelling of your face and throat  . A fast heartbeat  . A bad rash all over your body  . Dizziness and weakness    Immunizations Administered    Name Date Dose VIS Date Route   Pfizer COVID-19 Vaccine 07/12/2019  2:54 PM 0.3 mL 05/10/2019 Intramuscular   Manufacturer: [place]   Lot: [id number]   [place]: [id number]

## 2019-07-22 ENCOUNTER — Encounter: Payer: Self-pay | Admitting: Hematology & Oncology

## 2019-07-24 ENCOUNTER — Encounter: Payer: Self-pay | Admitting: Family

## 2019-07-24 ENCOUNTER — Inpatient Hospital Stay: Payer: Medicare Other

## 2019-07-24 ENCOUNTER — Inpatient Hospital Stay (HOSPITAL_BASED_OUTPATIENT_CLINIC_OR_DEPARTMENT_OTHER): Payer: Medicare Other | Admitting: Family

## 2019-07-24 ENCOUNTER — Other Ambulatory Visit: Payer: Self-pay

## 2019-07-24 VITALS — BP 149/94 | HR 57 | Temp 97.3°F | Resp 20 | Ht 63.0 in | Wt 186.0 lb

## 2019-07-24 DIAGNOSIS — C189 Malignant neoplasm of colon, unspecified: Secondary | ICD-10-CM | POA: Diagnosis not present

## 2019-07-24 DIAGNOSIS — D500 Iron deficiency anemia secondary to blood loss (chronic): Secondary | ICD-10-CM

## 2019-07-24 DIAGNOSIS — C186 Malignant neoplasm of descending colon: Secondary | ICD-10-CM

## 2019-07-24 DIAGNOSIS — C787 Secondary malignant neoplasm of liver and intrahepatic bile duct: Secondary | ICD-10-CM | POA: Diagnosis not present

## 2019-07-24 DIAGNOSIS — Z5111 Encounter for antineoplastic chemotherapy: Secondary | ICD-10-CM | POA: Diagnosis not present

## 2019-07-24 LAB — CBC WITH DIFFERENTIAL (CANCER CENTER ONLY)
Abs Immature Granulocytes: 0.18 10*3/uL — ABNORMAL HIGH (ref 0.00–0.07)
Basophils Absolute: 0.1 10*3/uL (ref 0.0–0.1)
Basophils Relative: 1 %
Eosinophils Absolute: 0.5 10*3/uL (ref 0.0–0.5)
Eosinophils Relative: 4 %
HCT: 36.5 % (ref 36.0–46.0)
Hemoglobin: 12 g/dL (ref 12.0–15.0)
Immature Granulocytes: 2 %
Lymphocytes Relative: 12 %
Lymphs Abs: 1.3 10*3/uL (ref 0.7–4.0)
MCH: 28.1 pg (ref 26.0–34.0)
MCHC: 32.9 g/dL (ref 30.0–36.0)
MCV: 85.5 fL (ref 80.0–100.0)
Monocytes Absolute: 1.3 10*3/uL — ABNORMAL HIGH (ref 0.1–1.0)
Monocytes Relative: 11 %
Neutro Abs: 8.2 10*3/uL — ABNORMAL HIGH (ref 1.7–7.7)
Neutrophils Relative %: 70 %
Platelet Count: 285 10*3/uL (ref 150–400)
RBC: 4.27 MIL/uL (ref 3.87–5.11)
RDW: 17.1 % — ABNORMAL HIGH (ref 11.5–15.5)
WBC Count: 11.6 10*3/uL — ABNORMAL HIGH (ref 4.0–10.5)
nRBC: 0 % (ref 0.0–0.2)

## 2019-07-24 LAB — CMP (CANCER CENTER ONLY)
ALT: 23 U/L (ref 0–44)
AST: 70 U/L — ABNORMAL HIGH (ref 15–41)
Albumin: 2.9 g/dL — ABNORMAL LOW (ref 3.5–5.0)
Alkaline Phosphatase: 353 U/L — ABNORMAL HIGH (ref 38–126)
Anion gap: 6 (ref 5–15)
BUN: 22 mg/dL (ref 8–23)
CO2: 26 mmol/L (ref 22–32)
Calcium: 9.5 mg/dL (ref 8.9–10.3)
Chloride: 104 mmol/L (ref 98–111)
Creatinine: 1.24 mg/dL — ABNORMAL HIGH (ref 0.44–1.00)
GFR, Est AFR Am: 47 mL/min — ABNORMAL LOW (ref >60)
GFR, Estimated: 40 mL/min — ABNORMAL LOW (ref >60)
Glucose, Bld: 113 mg/dL — ABNORMAL HIGH (ref 70–99)
Potassium: 3.8 mmol/L (ref 3.5–5.1)
Sodium: 136 mmol/L (ref 135–145)
Total Bilirubin: 0.4 mg/dL (ref 0.3–1.2)
Total Protein: 7.8 g/dL (ref 6.5–8.1)

## 2019-07-24 LAB — CEA (IN HOUSE-CHCC): CEA (CHCC-In House): 41.47 ng/mL — ABNORMAL HIGH (ref 0.00–5.00)

## 2019-07-24 LAB — IRON AND TIBC
Iron: 62 ug/dL (ref 41–142)
Saturation Ratios: 30 % (ref 21–57)
TIBC: 206 ug/dL — ABNORMAL LOW (ref 236–444)
UIBC: 144 ug/dL (ref 120–384)

## 2019-07-24 LAB — FERRITIN: Ferritin: 428 ng/mL — ABNORMAL HIGH (ref 11–307)

## 2019-07-24 MED ORDER — FLUOROURACIL CHEMO INJECTION 2.5 GM/50ML
320.0000 mg/m2 | Freq: Once | INTRAVENOUS | Status: AC
  Administered 2019-07-24: 600 mg via INTRAVENOUS
  Filled 2019-07-24: qty 12

## 2019-07-24 MED ORDER — ONDANSETRON HCL 8 MG PO TABS: 8.0000 mg | ORAL_TABLET | Freq: Two times a day (BID) | ORAL | 1 refills | Status: DC | PRN

## 2019-07-24 MED ORDER — LOPERAMIDE HCL 2 MG PO TABS: 4.0000 mg | ORAL_TABLET | Freq: Three times a day (TID) | ORAL | 1 refills | Status: DC | PRN

## 2019-07-24 MED ORDER — SODIUM CHLORIDE 0.9 % IV SOLN
400.0000 mg/m2 | Freq: Once | INTRAVENOUS | Status: AC
  Administered 2019-07-24: 772 mg via INTRAVENOUS
  Filled 2019-07-24: qty 38.6

## 2019-07-24 MED ORDER — SODIUM CHLORIDE 0.9 % IV SOLN
Freq: Once | INTRAVENOUS | Status: AC
  Filled 2019-07-24: qty 250

## 2019-07-24 MED ORDER — DEXAMETHASONE SODIUM PHOSPHATE 10 MG/ML IJ SOLN
10.0000 mg | Freq: Once | INTRAMUSCULAR | Status: AC
  Administered 2019-07-24: 10 mg via INTRAVENOUS

## 2019-07-24 MED ORDER — PROCHLORPERAZINE MALEATE 10 MG PO TABS: 10.0000 mg | ORAL_TABLET | Freq: Four times a day (QID) | ORAL | 1 refills | Status: DC | PRN

## 2019-07-24 MED ORDER — SODIUM CHLORIDE 0.9 % IV SOLN
1920.0000 mg/m2 | INTRAVENOUS | Status: DC
  Administered 2019-07-24: 3700 mg via INTRAVENOUS
  Filled 2019-07-24: qty 74

## 2019-07-24 MED ORDER — LORAZEPAM 1 MG PO TABS: 1.0000 mg | ORAL_TABLET | Freq: Four times a day (QID) | ORAL | 0 refills | Status: DC | PRN

## 2019-07-24 MED ORDER — PALONOSETRON HCL INJECTION 0.25 MG/5ML
INTRAVENOUS | Status: AC
  Filled 2019-07-24: qty 5

## 2019-07-24 MED ORDER — DEXAMETHASONE 4 MG PO TABS: 8.0000 mg | ORAL_TABLET | Freq: Every day | ORAL | 5 refills | Status: DC

## 2019-07-24 MED ORDER — PALONOSETRON HCL INJECTION 0.25 MG/5ML
0.2500 mg | Freq: Once | INTRAVENOUS | Status: AC
  Administered 2019-07-24: 0.25 mg via INTRAVENOUS

## 2019-07-24 MED ORDER — ATROPINE SULFATE 1 MG/ML IJ SOLN: 0.5000 mg | Freq: Once | INTRAMUSCULAR | Status: DC | PRN

## 2019-07-24 MED ORDER — SODIUM CHLORIDE 0.9 % IV SOLN
135.0000 mg/m2 | Freq: Once | INTRAVENOUS | Status: AC
  Administered 2019-07-24: 260 mg via INTRAVENOUS
  Filled 2019-07-24: qty 13

## 2019-07-24 MED ORDER — SODIUM CHLORIDE 0.9% FLUSH
10.0000 mL | INTRAVENOUS | Status: DC | PRN
  Filled 2019-07-24: qty 10

## 2019-07-24 MED ORDER — DEXAMETHASONE SODIUM PHOSPHATE 10 MG/ML IJ SOLN
INTRAMUSCULAR | Status: AC
  Filled 2019-07-24: qty 1

## 2019-07-24 MED ORDER — HEPARIN SOD (PORK) LOCK FLUSH 100 UNIT/ML IV SOLN
500.0000 [IU] | Freq: Once | INTRAVENOUS | Status: DC | PRN
  Filled 2019-07-24: qty 5

## 2019-07-24 NOTE — Patient Instructions (Signed)
Melanie [name]  Today you received the following [name] agents Irinotecan, Leucovorin, 5FU  To help prevent nausea and vomiting after your treatment, we encourage you to take your nausea medication as prescribed by MD. **DO NOT TAKE ZOFRAN FOR 3 DAYS AFTER [name] TREATMENT**   If you develop nausea and vomiting that is not controlled by your nausea medication, call the clinic.   BELOW ARE SYMPTOMS THAT SHOULD BE REPORTED IMMEDIATELY:  *FEVER GREATER THAN 100.5 F  *CHILLS WITH OR WITHOUT FEVER  NAUSEA AND VOMITING THAT IS NOT CONTROLLED WITH YOUR NAUSEA MEDICATION  *UNUSUAL SHORTNESS OF BREATH  *UNUSUAL BRUISING OR BLEEDING  TENDERNESS IN MOUTH AND THROAT WITH OR WITHOUT PRESENCE OF ULCERS  *URINARY PROBLEMS  *BOWEL PROBLEMS  UNUSUAL RASH Items with * indicate a potential emergency and should be followed up as soon as possible.  Feel free to call the clinic should you have any questions or concerns. The clinic phone number is [phone].  Please show the Melanie [name] at check-in to the Emergency Department and triage nurse.  Irinotecan injection What is this medicine? IRINOTECAN (ir in oh TEE kan ) is a [name] drug. It is used to treat colon and rectal cancer. This medicine may be used for other purposes; ask your health care provider or pharmacist if you have questions. COMMON BRAND NAME(S): Camptosar What should I tell my health care provider before I take this medicine? They need to know if you have any of these conditions:  dehydration  diarrhea  infection (especially a virus infection such as chickenpox, cold sores, or herpes)  liver disease  low blood counts, like low white cell, platelet, or red cell counts  low levels of calcium, magnesium, or potassium in the blood  recent or ongoing radiation therapy  an unusual or allergic reaction to irinotecan, other  medicines, foods, dyes, or preservatives  pregnant or trying to get pregnant  breast-feeding How should I use this medicine? This drug is given as an infusion into a vein. It is administered in a hospital or clinic by a specially trained health care professional. Talk to your pediatrician regarding the use of this medicine in children. Special care may be needed. Overdosage: If you think you have taken too much of this medicine contact a poison control center or emergency room at once. NOTE: This medicine is only for you. Do not share this medicine with others. What if I miss a dose? It is important not to miss your dose. Call your doctor or health care professional if you are unable to keep an appointment. What may interact with this medicine? This medicine may interact with the following medications:  antiviral medicines for HIV or AIDS  certain antibiotics like rifampin or rifabutin  certain medicines for fungal infections like itraconazole, ketoconazole, posaconazole, and voriconazole  certain medicines for seizures like carbamazepine, phenobarbital, phenotoin  clarithromycin  gemfibrozil  nefazodone  St. John's Wort This list may not describe all possible interactions. Give your health care provider a list of all the medicines, herbs, non-prescription drugs, or dietary supplements you use. Also tell them if you smoke, drink alcohol, or use illegal drugs. Some items may interact with your medicine. What should I watch for while using this medicine? Your condition will be monitored carefully while you are receiving this medicine. You will need important blood work done while you are taking this medicine. This drug may make you feel generally unwell. This is  not uncommon, as [name] can affect healthy cells as well as cancer cells. Report any side effects. Continue your course of treatment even though you feel ill unless your doctor tells you to stop. In some cases, you may  be given additional medicines to help with side effects. Follow all directions for their use. You may get drowsy or dizzy. Do not drive, use machinery, or do anything that needs mental alertness until you know how this medicine affects you. Do not stand or sit up quickly, especially if you are an older patient. This reduces the risk of dizzy or fainting spells. Call your health care professional for advice if you get a fever, chills, or sore throat, or other symptoms of a cold or flu. Do not treat yourself. This medicine decreases your body's ability to fight infections. Try to avoid being around people who are sick. Avoid taking products that contain aspirin, acetaminophen, ibuprofen, naproxen, or ketoprofen unless instructed by your doctor. These medicines may hide a fever. This medicine may increase your risk to bruise or bleed. Call your doctor or health care professional if you notice any unusual bleeding. Be careful brushing and flossing your teeth or using a toothpick because you may get an infection or bleed more easily. If you have any dental work done, tell your dentist you are receiving this medicine. Do not become pregnant while taking this medicine or for 6 months after stopping it. Women should inform their health care professional if they wish to become pregnant or think they might be pregnant. Men should not father a child while taking this medicine and for 3 months after stopping it. There is potential for serious side effects to an unborn child. Talk to your health care professional for more information. Do not breast-feed an infant while taking this medicine or for 7 days after stopping it. This medicine has caused ovarian failure in some women. This medicine may make it more difficult to get pregnant. Talk to your health care professional if you are concerned about your fertility. This medicine has caused decreased sperm counts in some men. This may make it more difficult to father a  child. Talk to your health care professional if you are concerned about your fertility. What side effects may I notice from receiving this medicine? Side effects that you should report to your doctor or health care professional as soon as possible:  allergic reactions like skin rash, itching or hives, swelling of the face, lips, or tongue  chest pain  diarrhea  flushing, runny nose, sweating during infusion  low blood counts - this medicine may decrease the number of white blood cells, red blood cells and platelets. You may be at increased risk for infections and bleeding.  nausea, vomiting  pain, swelling, warmth in the leg  signs of decreased platelets or bleeding - bruising, pinpoint red spots on the skin, black, tarry stools, blood in the urine  signs of infection - fever or chills, cough, sore throat, pain or difficulty passing urine  signs of decreased red blood cells - unusually weak or tired, fainting spells, lightheadedness Side effects that usually do not require medical attention (report to your doctor or health care professional if they continue or are bothersome):  constipation  hair loss  headache  loss of appetite  mouth sores  stomach pain This list may not describe all possible side effects. Call your doctor for medical advice about side effects. You may report side effects to FDA at 1-800-FDA-1088. Where  should I keep my medicine? This drug is given in a hospital or clinic and will not be stored at home. NOTE: This sheet is a summary. It may not cover all possible information. If you have questions about this medicine, talk to your doctor, pharmacist, or health care provider.  2020 Elsevier/Gold Standard (2018-07-06 10:09:17)   Leucovorin injection What is this medicine? LEUCOVORIN (loo koe VOR in) is used to prevent or treat the harmful effects of some medicines. This medicine is used to treat anemia caused by a low amount of folic acid in the body. It  is also used with 5-fluorouracil (5-FU) to treat colon cancer. This medicine may be used for other purposes; ask your health care provider or pharmacist if you have questions. What should I tell my health care provider before I take this medicine? They need to know if you have any of these conditions:  anemia from low levels of vitamin B-12 in the blood  an unusual or allergic reaction to leucovorin, folic acid, other medicines, foods, dyes, or preservatives  pregnant or trying to get pregnant  breast-feeding How should I use this medicine? This medicine is for injection into a muscle or into a vein. It is given by a health care professional in a hospital or clinic setting. Talk to your pediatrician regarding the use of this medicine in children. Special care may be needed. Overdosage: If you think you have taken too much of this medicine contact a poison control center or emergency room at once. NOTE: This medicine is only for you. Do not share this medicine with others. What if I miss a dose? This does not apply. What may interact with this medicine?  capecitabine  fluorouracil  phenobarbital  phenytoin  primidone  trimethoprim-sulfamethoxazole This list may not describe all possible interactions. Give your health care provider a list of all the medicines, herbs, non-prescription drugs, or dietary supplements you use. Also tell them if you smoke, drink alcohol, or use illegal drugs. Some items may interact with your medicine. What should I watch for while using this medicine? Your condition will be monitored carefully while you are receiving this medicine. This medicine may increase the side effects of 5-fluorouracil, 5-FU. Tell your doctor or health care professional if you have diarrhea or mouth sores that do not get better or that get worse. What side effects may I notice from receiving this medicine? Side effects that you should report to your doctor or health care  professional as soon as possible:  allergic reactions like skin rash, itching or hives, swelling of the face, lips, or tongue  breathing problems  fever, infection  mouth sores  unusual bleeding or bruising  unusually weak or tired Side effects that usually do not require medical attention (report to your doctor or health care professional if they continue or are bothersome):  constipation or diarrhea  loss of appetite  nausea, vomiting This list may not describe all possible side effects. Call your doctor for medical advice about side effects. You may report side effects to FDA at 1-800-FDA-1088. Where should I keep my medicine? This drug is given in a hospital or clinic and will not be stored at home. NOTE: This sheet is a summary. It may not cover all possible information. If you have questions about this medicine, talk to your doctor, pharmacist, or health care provider.  2020 Elsevier/Gold Standard (2007-11-20 16:50:29)   Fluorouracil, 5FU; Diclofenac topical cream What is this medicine? FLUOROURACIL; DICLOFENAC (flure oh YOOR  a sil; dye KLOE fen ak) is a combination of a topical [name] agent and non-steroidal anti-inflammatory drug (NSAID). It is used on the skin to treat skin cancer and skin conditions that could become cancer. This medicine may be used for other purposes; ask your health care provider or pharmacist if you have questions. COMMON BRAND NAME(S): FLUORAC What should I tell my health care provider before I take this medicine? They need to know if you have any of these conditions:  bleeding problems  cigarette smoker  DPD enzyme deficiency  heart disease  high blood pressure  if you frequently drink alcohol containing drinks  kidney disease  liver disease  open or infected skin  stomach problems  swelling or open sores at the treatment site  recent or planned coronary artery bypass graft (CABG) surgery  an unusual or allergic  reaction to fluorouracil, diclofenac, aspirin, other NSAIDs, other medicines, foods, dyes, or preservatives  pregnant or trying to get pregnant  breast-feeding How should I use this medicine? This medicine is only for use on the skin. Follow the directions on the prescription label. Wash hands before and after use. Wash affected area and gently pat dry. To apply this medicine use a cotton-tipped applicator, or use gloves if applying with fingertips. If applied with unprotected fingertips, it is very important to wash your hands well after you apply this medicine. Avoid applying to the eyes, nose, or mouth. Apply enough medicine to cover the affected area. You can cover the area with a light gauze dressing, but do not use tight or air-tight dressings. Finish the full course prescribed by your doctor or health care professional, even if you think your condition is better. Do not stop taking except on the advice of your doctor or health care professional. Talk to your pediatrician regarding the use of this medicine in children. Special care may be needed. Overdosage: If you think you have taken too much of this medicine contact a poison control center or emergency room at once. NOTE: This medicine is only for you. Do not share this medicine with others. What if I miss a dose? If you miss a dose, apply it as soon as you can. If it is almost time for your next dose, only use that dose. Do not apply extra doses. Contact your doctor or health care professional if you miss more than one dose. What may interact with this medicine? Interactions are not expected. Do not use any other skin products without telling your doctor or health care professional. This list may not describe all possible interactions. Give your health care provider a list of all the medicines, herbs, non-prescription drugs, or dietary supplements you use. Also tell them if you smoke, drink alcohol, or use illegal drugs. Some items may  interact with your medicine. What should I watch for while using this medicine? Visit your doctor or healthcare provider for checks on your progress. You will need to use this medicine for 2 to 6 weeks. This may be longer depending on the condition being treated. You may not see full healing for another 1 to 2 months after you stop using the medicine. This medicine may cause serious skin reactions. They can happen weeks to months after starting the medicine. Contact your healthcare provider right away if you notice fevers or flu-like symptoms with a rash. The rash may be red or purple and then turn into blisters or peeling of the skin. Or, you might notice a red rash with  swelling of the face, lips or lymph nodes in your neck or under your arms. Treated areas of skin can look unsightly during and for several weeks after treatment with this medicine. This medicine can make you more sensitive to the sun. Keep out of the sun. If you cannot avoid being in the sun, wear protective clothing and use sunscreen. Do not use sun lamps or tanning beds/booths. If a pet comes in contact with the area where this medicine was applied to your skin or if it is ingested, they may have a serious risk of side effects. If accidental contact happens, the skin of the pet should be washed right away with soap and water. Contact your vet right away if your pet becomes exposed. Do not become pregnant while taking this medicine. Women should inform their doctor if they wish to become pregnant or think they might be pregnant. There is a potential for serious side effects to an unborn child. Talk to your healthcare provider or pharmacist for more information. What side effects may I notice from receiving this medicine? Side effects that you should report to your doctor or health care professional as soon as possible:  allergic reactions like skin rash, itching or hives, swelling of the face, lips, or tongue  black or bloody stools,  blood in the urine or vomit  blurred vision  chest pain  difficulty breathing or wheezing  rash, fever, and swollen lymph nodes  redness, blistering, peeling or loosening of the skin, including inside the mouth  severe redness and swelling of normal skin  slurred speech or weakness on one side of the body  trouble passing urine or change in the amount of urine  unexplained weight gain or swelling  unusually weak or tired  yellowing of eyes or skin Side effects that usually do not require medical attention (report to your doctor or health care professional if they continue or are bothersome):  increased sensitivity of the skin to sun and ultraviolet light  pain and burning of the affected area  scaling or swelling of the affected area  skin rash, itching of the affected area  tenderness This list may not describe all possible side effects. Call your doctor for medical advice about side effects. You may report side effects to FDA at 1-800-FDA-1088. Where should I keep my medicine? Keep out of the reach of children and pets. Store at room temperature between 20 and 25 degrees C (68 and 77 degrees F). Throw away any unused medicine after the expiration date. NOTE: This sheet is a summary. It may not cover all possible information. If you have questions about this medicine, talk to your doctor, pharmacist, or health care provider.  2020 Elsevier/Gold Standard (2018-08-01 13:31:57)

## 2019-07-24 NOTE — Progress Notes (Signed)
Hematology and Oncology Follow Up Visit  MONI ROTHROCK [id number] 1934/07/18 85 y.o. 07/24/2019   Principle Diagnosis:  Metastatic colon cancer -- progressive mets -- NO actionable mutation/LOW TMB/ MSI stable/ KRAS (+)/HER2-  Past Therapy: FOLFOX q 14 days s/p cycle 3 - DC'd secondary to non-tolerance RFA surgery - January 2019 Xeloda 1500 mg po BID (14/7) -- s/p c#4- start on 11/20/2018 -- d/c on 02/28/2019 Avastin 7.5 mg/kg IV q 3 week -- S/p c#4 -started on 06//23/2020 -- d/c on 02/28/2019 Stivarga 80 mg po q day (14 on/7 off) -- start on 04/24/2019  -- d/c on 07/10/2019 due to progression  Current Therapy:   FOLFIRI -- started cycle 1 today, 07/24/2019   Interim History:  Ms. Stiner is here today for follow-up and to start Cycle 1 of FOLFIRI. She is doing well and has no complaints at this time.  CEA earlier this month was 48.  She denies fatigue and is resting well at night.  No fever, chills, n/v, cough, rash, dizziness, SOB, chest pain, palpitations, abdominal pain or changes in bowel or bladder habits.  No swelling, tenderness, numbness or tingling in her extremities at this time. Radial and pedal pulses 2+.  No falls or syncopal episodes. She is ambulating well without assistance.  She states that she has a food appetite and is staying well hydrated. Her weight is stable.   ECOG Performance Status: 1 - Symptomatic but completely ambulatory  Medications:  Allergies as of 07/24/2019   No Known Allergies     Medication List       Accurate as of July 24, 2019  9:28 AM. If you have any questions, ask your nurse or doctor.        amlodipine-atorvastatin 10-10 MG tablet Commonly known as: CADUET Take 1 tablet by mouth daily.   aspirin 81 MG chewable tablet Chew by mouth daily.   CALTRATE 600+D PO Take 1 tablet by mouth at bedtime.   levothyroxine 75 MCG tablet Commonly known as: SYNTHROID Take 1 tablet (75 mcg total) by mouth daily before  breakfast.   lidocaine-prilocaine cream Commonly known as: EMLA APPLY TO AFFECTED AREA AS DIRECTED AS NEEDED   meloxicam 7.5 MG tablet Commonly known as: Mobic Take 1 tablet (7.5 mg total) by mouth daily. You MUST take with food!!   metoprolol tartrate 25 MG tablet Commonly known as: LOPRESSOR Take 50 mg by mouth 2 (two) times daily.   multivitamin with minerals Tabs tablet Take 1 tablet by mouth daily.   naproxen 500 MG EC tablet Commonly known as: EC NAPROSYN Take 500 mg by mouth 2 (two) times daily with a meal.   ondansetron 4 MG tablet Commonly known as: Zofran Take 1 tablet (4 mg total) by mouth every 8 (eight) hours as needed for nausea or vomiting.   potassium chloride SA 20 MEQ tablet Commonly known as: KLOR-CON Take 1 tablet (20 mEq total) by mouth daily.   VITAMIN B-12 PO Take 1 tablet by mouth daily.       Allergies: No Known Allergies  Past Medical History, Surgical history, Social history, and Family History were reviewed and updated.  Review of Systems: All other 10 point review of systems is negative.   Physical Exam:  height is 5' 3"  (1.6 m) and weight is 186 lb 0.6 oz (84.4 kg). Her blood pressure is 149/94 (abnormal).   Wt Readings from Last 3 Encounters:  07/24/19 186 lb 0.6 oz (84.4 kg)  07/24/19 186 lb 0.6 oz (  84.4 kg)  07/10/19 184 lb 0.2 oz (83.5 kg)    Ocular: Sclerae unicteric, pupils equal, round and reactive to light Ear-nose-throat: Oropharynx clear, dentition fair Lymphatic: No cervical or supraclavicular adenopathy Lungs no rales or rhonchi, good excursion bilaterally Heart regular rate and rhythm, no murmur appreciated Abd soft, nontender, positive bowel sounds, no liver or spleen tip palpated on exam, no fluid wave  MSK no focal spinal tenderness, no joint edema Neuro: non-focal, well-oriented, appropriate affect Breasts: Deferred   Lab Results  Component Value Date   WBC 11.6 (H) 07/24/2019   HGB 12.0 07/24/2019   HCT  36.5 07/24/2019   MCV 85.5 07/24/2019   PLT 285 07/24/2019   Lab Results  Component Value Date   FERRITIN 488 (H) 07/10/2019   IRON 62 07/10/2019   TIBC 226 (L) 07/10/2019   UIBC 164 07/10/2019   IRONPCTSAT 28 07/10/2019   Lab Results  Component Value Date   RETICCTPCT 1.4 09/04/2018   RBC 4.27 07/24/2019   No results found for: KPAFRELGTCHN, LAMBDASER, KAPLAMBRATIO No results found for: IGGSERUM, IGA, IGMSERUM No results found for: Kathrynn Ducking, MSPIKE, SPEI   Chemistry      Component Value Date/Time   NA 137 07/10/2019 1131   NA 144 04/26/2017 0830   NA 137 09/30/2016 0940   K 3.8 07/10/2019 1131   K 4.1 04/26/2017 0830   K 4.7 09/30/2016 0940   CL 103 07/10/2019 1131   CL 108 04/26/2017 0830   CO2 25 07/10/2019 1131   CO2 27 04/26/2017 0830   CO2 18 (L) 09/30/2016 0940   BUN 17 07/10/2019 1131   BUN 25 (H) 04/26/2017 0830   BUN 21.5 09/30/2016 0940   CREATININE 1.23 (H) 07/10/2019 1131   CREATININE 1.5 (H) 04/26/2017 0830   CREATININE 1.8 (H) 09/30/2016 0940      Component Value Date/Time   CALCIUM 9.8 07/10/2019 1131   CALCIUM 9.9 04/26/2017 0830   CALCIUM 10.4 09/30/2016 0940   ALKPHOS 432 (H) 07/10/2019 1131   ALKPHOS 124 (H) 04/26/2017 0830   ALKPHOS 147 09/30/2016 0940   AST 87 (H) 07/10/2019 1131   AST 27 09/30/2016 0940   ALT 26 07/10/2019 1131   ALT 16 04/26/2017 0830   ALT 11 09/30/2016 0940   BILITOT 0.6 07/10/2019 1131   BILITOT 0.34 09/30/2016 0940       Impression and Plan: Ms. Battin is a very pleasant 85 yo African American female with adenocarcinoma of the descending colon that has recurred and is now metastatic.Unfortunately recent scans showed progression of disease and she will now be starting new treatment with FOLFIRI.  We will proceed with day 1 of Cycle 1 today as planned.  We will plan to see her back in another 2 weeks.  She will contact our office with any questions or concerns.  We can certainly see her sooner if needed.   Laverna Peace, NP 2/24/20219:28 AM

## 2019-07-25 ENCOUNTER — Encounter: Payer: Self-pay | Admitting: Hematology & Oncology

## 2019-07-26 ENCOUNTER — Inpatient Hospital Stay: Payer: Medicare Other

## 2019-07-26 ENCOUNTER — Other Ambulatory Visit: Payer: Self-pay

## 2019-07-26 VITALS — BP 151/72 | HR 58 | Temp 97.3°F | Resp 18

## 2019-07-26 DIAGNOSIS — C189 Malignant neoplasm of colon, unspecified: Secondary | ICD-10-CM

## 2019-07-26 DIAGNOSIS — C186 Malignant neoplasm of descending colon: Secondary | ICD-10-CM

## 2019-07-26 DIAGNOSIS — Z5111 Encounter for antineoplastic chemotherapy: Secondary | ICD-10-CM | POA: Diagnosis not present

## 2019-07-26 DIAGNOSIS — C787 Secondary malignant neoplasm of liver and intrahepatic bile duct: Secondary | ICD-10-CM

## 2019-07-26 MED ORDER — SODIUM CHLORIDE 0.9% FLUSH
10.0000 mL | INTRAVENOUS | Status: DC | PRN
  Administered 2019-07-26: 10 mL
  Filled 2019-07-26: qty 10

## 2019-07-26 MED ORDER — HEPARIN SOD (PORK) LOCK FLUSH 100 UNIT/ML IV SOLN
500.0000 [IU] | Freq: Once | INTRAVENOUS | Status: AC | PRN
  Administered 2019-07-26: 500 [IU]
  Filled 2019-07-26: qty 5

## 2019-07-26 NOTE — Patient Instructions (Signed)
Fluorouracil, 5-FU injection What is this medicine? FLUOROURACIL, 5-FU (flure oh YOOR a sil) is a chemotherapy drug. It slows the growth of cancer cells. This medicine is used to treat many types of cancer like breast cancer, colon or rectal cancer, pancreatic cancer, and stomach cancer. This medicine may be used for other purposes; ask your health care provider or pharmacist if you have questions. COMMON BRAND NAME(S): Adrucil What should I tell my health care provider before I take this medicine? They need to know if you have any of these conditions:  blood disorders  dihydropyrimidine dehydrogenase (DPD) deficiency  infection (especially a virus infection such as chickenpox, cold sores, or herpes)  kidney disease  liver disease  malnourished, poor nutrition  recent or ongoing radiation therapy  an unusual or allergic reaction to fluorouracil, other chemotherapy, other medicines, foods, dyes, or preservatives  pregnant or trying to get pregnant  breast-feeding How should I use this medicine? This drug is given as an infusion or injection into a vein. It is administered in a hospital or clinic by a specially trained health care professional. Talk to your pediatrician regarding the use of this medicine in children. Special care may be needed. Overdosage: If you think you have taken too much of this medicine contact a poison control center or emergency room at once. NOTE: This medicine is only for you. Do not share this medicine with others. What if I miss a dose? It is important not to miss your dose. Call your doctor or health care professional if you are unable to keep an appointment. What may interact with this medicine?  allopurinol  cimetidine  dapsone  digoxin  hydroxyurea  leucovorin  levamisole  medicines for seizures like ethotoin, fosphenytoin, phenytoin  medicines to increase blood counts like filgrastim, pegfilgrastim, sargramostim  medicines that  treat or prevent blood clots like warfarin, enoxaparin, and dalteparin  methotrexate  metronidazole  pyrimethamine  some other chemotherapy drugs like busulfan, cisplatin, estramustine, vinblastine  trimethoprim  trimetrexate  vaccines Talk to your doctor or health care professional before taking any of these medicines:  acetaminophen  aspirin  ibuprofen  ketoprofen  naproxen This list may not describe all possible interactions. Give your health care provider a list of all the medicines, herbs, non-prescription drugs, or dietary supplements you use. Also tell them if you smoke, drink alcohol, or use illegal drugs. Some items may interact with your medicine. What should I watch for while using this medicine? Visit your doctor for checks on your progress. This drug may make you feel generally unwell. This is not uncommon, as chemotherapy can affect healthy cells as well as cancer cells. Report any side effects. Continue your course of treatment even though you feel ill unless your doctor tells you to stop. In some cases, you may be given additional medicines to help with side effects. Follow all directions for their use. Call your doctor or health care professional for advice if you get a fever, chills or sore throat, or other symptoms of a cold or flu. Do not treat yourself. This drug decreases your body's ability to fight infections. Try to avoid being around people who are sick. This medicine may increase your risk to bruise or bleed. Call your doctor or health care professional if you notice any unusual bleeding. Be careful brushing and flossing your teeth or using a toothpick because you may get an infection or bleed more easily. If you have any dental work done, tell your dentist you are   receiving this medicine. Avoid taking products that contain aspirin, acetaminophen, ibuprofen, naproxen, or ketoprofen unless instructed by your doctor. These medicines may hide a fever. Do not  become pregnant while taking this medicine. Women should inform their doctor if they wish to become pregnant or think they might be pregnant. There is a potential for serious side effects to an unborn child. Talk to your health care professional or pharmacist for more information. Do not breast-feed an infant while taking this medicine. Men should inform their doctor if they wish to father a child. This medicine may lower sperm counts. Do not treat diarrhea with over the counter products. Contact your doctor if you have diarrhea that lasts more than 2 days or if it is severe and watery. This medicine can make you more sensitive to the sun. Keep out of the sun. If you cannot avoid being in the sun, wear protective clothing and use sunscreen. Do not use sun lamps or tanning beds/booths. What side effects may I notice from receiving this medicine? Side effects that you should report to your doctor or health care professional as soon as possible:  allergic reactions like skin rash, itching or hives, swelling of the face, lips, or tongue  low blood counts - this medicine may decrease the number of white blood cells, red blood cells and platelets. You may be at increased risk for infections and bleeding.  signs of infection - fever or chills, cough, sore throat, pain or difficulty passing urine  signs of decreased platelets or bleeding - bruising, pinpoint red spots on the skin, black, tarry stools, blood in the urine  signs of decreased red blood cells - unusually weak or tired, fainting spells, lightheadedness  breathing problems  changes in vision  chest pain  mouth sores  nausea and vomiting  pain, swelling, redness at site where injected  pain, tingling, numbness in the hands or feet  redness, swelling, or sores on hands or feet  stomach pain  unusual bleeding Side effects that usually do not require medical attention (report to your doctor or health care professional if they  continue or are bothersome):  changes in finger or toe nails  diarrhea  dry or itchy skin  hair loss  headache  loss of appetite  sensitivity of eyes to the light  stomach upset  unusually teary eyes This list may not describe all possible side effects. Call your doctor for medical advice about side effects. You may report side effects to FDA at 1-800-FDA-1088. Where should I keep my medicine? This drug is given in a hospital or clinic and will not be stored at home. NOTE: This sheet is a summary. It may not cover all possible information. If you have questions about this medicine, talk to your doctor, pharmacist, or health care provider.  2020 Elsevier/Gold Standard (2007-09-19 13:53:16)  

## 2019-08-05 ENCOUNTER — Ambulatory Visit: Payer: Medicare Other | Attending: Internal Medicine

## 2019-08-05 DIAGNOSIS — Z23 Encounter for immunization: Secondary | ICD-10-CM | POA: Insufficient documentation

## 2019-08-05 NOTE — Progress Notes (Signed)
   Covid-19 Vaccination Clinic  Name:  Melanie [name]    MRN: [id number] DOB: 1935/03/22  08/05/2019  Ms. Kunkler was observed post Covid-19 immunization for 15 minutes without incident. She was provided with Vaccine Information Sheet and instruction to access the V-Safe system.   Ms. Skalski was instructed to call 911 with any severe reactions post vaccine: Marland Kitchen Difficulty breathing  . Swelling of face and throat  . A fast heartbeat  . A bad rash all over body  . Dizziness and weakness   Immunizations Administered    Name Date Dose VIS Date Route   Pfizer COVID-19 Vaccine 08/05/2019 11:57 AM 0.3 mL 05/10/2019 Intramuscular   Manufacturer: [place]   Lot: [id number]   [place]: [id number]

## 2019-08-07 ENCOUNTER — Ambulatory Visit: Payer: Medicare Other

## 2019-08-07 ENCOUNTER — Other Ambulatory Visit: Payer: Medicare Other

## 2019-08-07 ENCOUNTER — Ambulatory Visit: Payer: Medicare Other | Admitting: Family

## 2019-08-14 ENCOUNTER — Ambulatory Visit: Payer: Medicare Other

## 2019-08-14 ENCOUNTER — Inpatient Hospital Stay: Payer: Medicare Other

## 2019-08-14 ENCOUNTER — Inpatient Hospital Stay (HOSPITAL_BASED_OUTPATIENT_CLINIC_OR_DEPARTMENT_OTHER): Payer: Medicare Other | Admitting: Family

## 2019-08-14 ENCOUNTER — Encounter: Payer: Self-pay | Admitting: Family

## 2019-08-14 ENCOUNTER — Inpatient Hospital Stay: Payer: Medicare Other | Attending: Hematology & Oncology

## 2019-08-14 ENCOUNTER — Other Ambulatory Visit: Payer: Medicare Other

## 2019-08-14 ENCOUNTER — Other Ambulatory Visit: Payer: Self-pay

## 2019-08-14 ENCOUNTER — Ambulatory Visit: Payer: Medicare Other | Admitting: Family

## 2019-08-14 ENCOUNTER — Other Ambulatory Visit: Payer: Self-pay | Admitting: Family

## 2019-08-14 VITALS — BP 145/85 | HR 61 | Temp 97.1°F | Resp 18 | Ht 63.0 in | Wt 188.0 lb

## 2019-08-14 DIAGNOSIS — C186 Malignant neoplasm of descending colon: Secondary | ICD-10-CM

## 2019-08-14 DIAGNOSIS — D649 Anemia, unspecified: Secondary | ICD-10-CM

## 2019-08-14 DIAGNOSIS — Z5111 Encounter for antineoplastic chemotherapy: Secondary | ICD-10-CM | POA: Insufficient documentation

## 2019-08-14 DIAGNOSIS — C787 Secondary malignant neoplasm of liver and intrahepatic bile duct: Secondary | ICD-10-CM

## 2019-08-14 DIAGNOSIS — R5383 Other fatigue: Secondary | ICD-10-CM | POA: Diagnosis not present

## 2019-08-14 DIAGNOSIS — Z7952 Long term (current) use of systemic steroids: Secondary | ICD-10-CM | POA: Diagnosis not present

## 2019-08-14 DIAGNOSIS — C189 Malignant neoplasm of colon, unspecified: Secondary | ICD-10-CM

## 2019-08-14 DIAGNOSIS — Z79899 Other long term (current) drug therapy: Secondary | ICD-10-CM | POA: Insufficient documentation

## 2019-08-14 DIAGNOSIS — D500 Iron deficiency anemia secondary to blood loss (chronic): Secondary | ICD-10-CM

## 2019-08-14 LAB — CMP (CANCER CENTER ONLY)
ALT: 9 U/L (ref 0–44)
AST: 30 U/L (ref 15–41)
Albumin: 3.7 g/dL (ref 3.5–5.0)
Alkaline Phosphatase: 173 U/L — ABNORMAL HIGH (ref 38–126)
Anion gap: 6 (ref 5–15)
BUN: 16 mg/dL (ref 8–23)
CO2: 27 mmol/L (ref 22–32)
Calcium: 9.5 mg/dL (ref 8.9–10.3)
Chloride: 105 mmol/L (ref 98–111)
Creatinine: 1.38 mg/dL — ABNORMAL HIGH (ref 0.44–1.00)
GFR, Est AFR Am: 41 mL/min — ABNORMAL LOW (ref >60)
GFR, Estimated: 36 mL/min — ABNORMAL LOW (ref >60)
Glucose, Bld: 145 mg/dL — ABNORMAL HIGH (ref 70–99)
Potassium: 3.8 mmol/L (ref 3.5–5.1)
Sodium: 138 mmol/L (ref 135–145)
Total Bilirubin: 0.3 mg/dL (ref 0.3–1.2)
Total Protein: 6.9 g/dL (ref 6.5–8.1)

## 2019-08-14 LAB — CBC WITH DIFFERENTIAL (CANCER CENTER ONLY)
Abs Immature Granulocytes: 0.06 10*3/uL (ref 0.00–0.07)
Basophils Absolute: 0.1 10*3/uL (ref 0.0–0.1)
Basophils Relative: 1 %
Eosinophils Absolute: 0.7 10*3/uL — ABNORMAL HIGH (ref 0.0–0.5)
Eosinophils Relative: 9 %
HCT: 35.3 % — ABNORMAL LOW (ref 36.0–46.0)
Hemoglobin: 11.6 g/dL — ABNORMAL LOW (ref 12.0–15.0)
Immature Granulocytes: 1 %
Lymphocytes Relative: 18 %
Lymphs Abs: 1.4 10*3/uL (ref 0.7–4.0)
MCH: 28.5 pg (ref 26.0–34.0)
MCHC: 32.9 g/dL (ref 30.0–36.0)
MCV: 86.7 fL (ref 80.0–100.0)
Monocytes Absolute: 1.2 10*3/uL — ABNORMAL HIGH (ref 0.1–1.0)
Monocytes Relative: 16 %
Neutro Abs: 4.2 10*3/uL (ref 1.7–7.7)
Neutrophils Relative %: 55 %
Platelet Count: 276 10*3/uL (ref 150–400)
RBC: 4.07 MIL/uL (ref 3.87–5.11)
RDW: 18.3 % — ABNORMAL HIGH (ref 11.5–15.5)
WBC Count: 7.6 10*3/uL (ref 4.0–10.5)
nRBC: 0 % (ref 0.0–0.2)

## 2019-08-14 LAB — IRON AND TIBC
Iron: 38 ug/dL — ABNORMAL LOW (ref 41–142)
Saturation Ratios: 16 % — ABNORMAL LOW (ref 21–57)
TIBC: 243 ug/dL (ref 236–444)
UIBC: 205 ug/dL (ref 120–384)

## 2019-08-14 LAB — FERRITIN: Ferritin: 414 ng/mL — ABNORMAL HIGH (ref 11–307)

## 2019-08-14 MED ORDER — SODIUM CHLORIDE 0.9 % IV SOLN
1920.0000 mg/m2 | INTRAVENOUS | Status: AC
  Administered 2019-08-14: 3700 mg via INTRAVENOUS
  Filled 2019-08-14: qty 74

## 2019-08-14 MED ORDER — FLUOROURACIL CHEMO INJECTION 2.5 GM/50ML
320.0000 mg/m2 | Freq: Once | INTRAVENOUS | Status: AC
  Administered 2019-08-14: 600 mg via INTRAVENOUS
  Filled 2019-08-14: qty 12

## 2019-08-14 MED ORDER — SODIUM CHLORIDE 0.9 % IV SOLN
400.0000 mg/m2 | Freq: Once | INTRAVENOUS | Status: AC
  Administered 2019-08-14: 772 mg via INTRAVENOUS
  Filled 2019-08-14: qty 38.6

## 2019-08-14 MED ORDER — PALONOSETRON HCL INJECTION 0.25 MG/5ML
INTRAVENOUS | Status: AC
  Filled 2019-08-14: qty 5

## 2019-08-14 MED ORDER — DEXAMETHASONE SODIUM PHOSPHATE 10 MG/ML IJ SOLN
INTRAMUSCULAR | Status: AC
  Filled 2019-08-14: qty 1

## 2019-08-14 MED ORDER — ATROPINE SULFATE 1 MG/ML IJ SOLN: 0.5000 mg | Freq: Once | INTRAMUSCULAR | Status: DC | PRN

## 2019-08-14 MED ORDER — SODIUM CHLORIDE 0.9 % IV SOLN
Freq: Once | INTRAVENOUS | Status: AC
  Filled 2019-08-14: qty 250

## 2019-08-14 MED ORDER — SODIUM CHLORIDE 0.9 % IV SOLN
135.0000 mg/m2 | Freq: Once | INTRAVENOUS | Status: AC
  Administered 2019-08-14: 260 mg via INTRAVENOUS
  Filled 2019-08-14: qty 5

## 2019-08-14 MED ORDER — PALONOSETRON HCL INJECTION 0.25 MG/5ML
0.2500 mg | Freq: Once | INTRAVENOUS | Status: AC
  Administered 2019-08-14: 0.25 mg via INTRAVENOUS

## 2019-08-14 MED ORDER — DEXAMETHASONE SODIUM PHOSPHATE 10 MG/ML IJ SOLN
10.0000 mg | Freq: Once | INTRAMUSCULAR | Status: AC
  Administered 2019-08-14: 10 mg via INTRAVENOUS

## 2019-08-14 NOTE — Progress Notes (Unsigned)
Ok to treat with today's lab values per Dr. Ennever. 

## 2019-08-14 NOTE — Patient Instructions (Signed)

## 2019-08-14 NOTE — Progress Notes (Signed)
Hematology and Oncology Follow Up Visit  Melanie [name] [id number] 04-20-1935 85 y.o. 08/14/2019   Principle Diagnosis:  Metastatic colon cancer -- progressive mets -- NO actionable mutation/LOW TMB/ MSI stable/ KRAS (+)/HER2-  Past Therapy: FOLFOX q 14 days s/p cycle 3 - DC'd secondary to non-tolerance RFA surgery - January 2019 Xeloda 1500 mg po BID (14/7) -- s/p c#4- start on 11/20/2018 -- d/c on 02/28/2019 Avastin 7.5 mg/kg IV q 3 week -- S/p c#4 -started on 06//23/2020 -- d/c on 02/28/2019 Stivarga 80 mg po q day (14 on/7 off) -- start on 04/24/2019-- d/c on 07/10/2019 due to progression  Current Therapy:   FOLFIRI -- started 07/24/2019, s/p cycle 1    Interim History:  Melanie [name] is here today for follow-up and cycle 2 of treatment. She did well with cycle one and had no issue with side effects.  She has occasional fatigue and will rest when needed.  No fever, chills, n/v, cough, rash, dizziness, SOB, chest pain, palpitations, abdominal pain or changes in bowel or bladder habits.  No episodes of bleeding. No bruising or petechiae.  No swelling, tenderness, numbness or tingling in her extremities.  No falls or syncopal episodes.  She has been eating well and staying hydrated. Her weight is stable.   ECOG Performance Status: 1 - Symptomatic but completely ambulatory  Medications:  Allergies as of 08/14/2019   No Known Allergies     Medication List       Accurate as of August 14, 2019 10:40 AM. If you have any questions, ask your nurse or doctor.        amlodipine-atorvastatin 10-10 MG tablet Commonly known as: CADUET Take 1 tablet by mouth daily.   aspirin 81 MG chewable tablet Chew by mouth daily.   CALTRATE 600+D PO Take 1 tablet by mouth at bedtime.   dexamethasone 4 MG tablet Commonly known as: DECADRON Take 2 tablets (8 mg total) by mouth daily. Start the day after chemo for 2 days.   levothyroxine 75 MCG tablet Commonly known as:  SYNTHROID Take 1 tablet (75 mcg total) by mouth daily before breakfast.   lidocaine-prilocaine cream Commonly known as: EMLA APPLY TO AFFECTED AREA AS DIRECTED AS NEEDED   loperamide 2 MG tablet Commonly known as: Imodium A-D Take 2 tablets (4 mg total) by mouth 3 (three) times daily as needed. Take 2 at diarrhea onset , then 1 every 2hr until 12hrs with no BM. May take 2 every 4hrs at night. If diarrhea recurs repeat.   LORazepam 1 MG tablet Commonly known as: Ativan Take 1 tablet (1 mg total) by mouth every 6 (six) hours as needed (NAUSEA).   meloxicam 7.5 MG tablet Commonly known as: Mobic Take 1 tablet (7.5 mg total) by mouth daily. You MUST take with food!!   metoprolol tartrate 25 MG tablet Commonly known as: LOPRESSOR Take 50 mg by mouth 2 (two) times daily.   multivitamin with minerals Tabs tablet Take 1 tablet by mouth daily.   naproxen 500 MG EC tablet Commonly known as: EC NAPROSYN Take 500 mg by mouth 2 (two) times daily with a meal.   ondansetron 4 MG tablet Commonly known as: Zofran Take 1 tablet (4 mg total) by mouth every 8 (eight) hours as needed for nausea or vomiting.   ondansetron 8 MG tablet Commonly known as: ZOFRAN Take 1 tablet (8 mg total) by mouth 2 (two) times daily as needed for refractory nausea / vomiting. Start on day 3 after  chemotherapy.   potassium chloride SA 20 MEQ tablet Commonly known as: KLOR-CON Take 1 tablet (20 mEq total) by mouth daily.   prochlorperazine 10 MG tablet Commonly known as: COMPAZINE Take 1 tablet (10 mg total) by mouth every 6 (six) hours as needed (NAUSEA).   VITAMIN B-12 PO Take 1 tablet by mouth daily.       Allergies: No Known Allergies  Past Medical History, Surgical history, Social history, and Family History were reviewed and updated.  Review of Systems: All other 10 point review of systems is negative.   Physical Exam:  height is 5' 3"  (1.6 m) and weight is 188 lb (85.3 kg). Her temporal  temperature is 97.1 F (36.2 C) (abnormal). Her blood pressure is 145/85 (abnormal) and her pulse is 61. Her respiration is 18 and oxygen saturation is 98%.   Wt Readings from Last 3 Encounters:  08/14/19 188 lb (85.3 kg)  07/24/19 186 lb 0.6 oz (84.4 kg)  07/24/19 186 lb 0.6 oz (84.4 kg)    Ocular: Sclerae unicteric, pupils equal, round and reactive to light Ear-nose-throat: Oropharynx clear, dentition fair Lymphatic: No cervical or supraclavicular adenopathy Lungs no rales or rhonchi, good excursion bilaterally Heart regular rate and rhythm, no murmur appreciated Abd soft, nontender, positive bowel sounds, no liver or spleen tip palpated on exam, no fluid wave  MSK no focal spinal tenderness, no joint edema Neuro: non-focal, well-oriented, appropriate affect Breasts: Deferred   Lab Results  Component Value Date   WBC 7.6 08/14/2019   HGB 11.6 (L) 08/14/2019   HCT 35.3 (L) 08/14/2019   MCV 86.7 08/14/2019   PLT 276 08/14/2019   Lab Results  Component Value Date   FERRITIN 428 (H) 07/24/2019   IRON 62 07/24/2019   TIBC 206 (L) 07/24/2019   UIBC 144 07/24/2019   IRONPCTSAT 30 07/24/2019   Lab Results  Component Value Date   RETICCTPCT 1.4 09/04/2018   RBC 4.07 08/14/2019   No results found for: KPAFRELGTCHN, LAMBDASER, KAPLAMBRATIO No results found for: IGGSERUM, IGA, IGMSERUM No results found for: Kathrynn Ducking, MSPIKE, SPEI   Chemistry      Component Value Date/Time   NA 138 08/14/2019 0945   NA 144 04/26/2017 0830   NA 137 09/30/2016 0940   K 3.8 08/14/2019 0945   K 4.1 04/26/2017 0830   K 4.7 09/30/2016 0940   CL 105 08/14/2019 0945   CL 108 04/26/2017 0830   CO2 27 08/14/2019 0945   CO2 27 04/26/2017 0830   CO2 18 (L) 09/30/2016 0940   BUN 16 08/14/2019 0945   BUN 25 (H) 04/26/2017 0830   BUN 21.5 09/30/2016 0940   CREATININE 1.38 (H) 08/14/2019 0945   CREATININE 1.5 (H) 04/26/2017 0830   CREATININE 1.8 (H)  09/30/2016 0940      Component Value Date/Time   CALCIUM 9.5 08/14/2019 0945   CALCIUM 9.9 04/26/2017 0830   CALCIUM 10.4 09/30/2016 0940   ALKPHOS 173 (H) 08/14/2019 0945   ALKPHOS 124 (H) 04/26/2017 0830   ALKPHOS 147 09/30/2016 0940   AST 30 08/14/2019 0945   AST 27 09/30/2016 0940   ALT 9 08/14/2019 0945   ALT 16 04/26/2017 0830   ALT 11 09/30/2016 0940   BILITOT 0.3 08/14/2019 0945   BILITOT 0.34 09/30/2016 0940       Impression and Plan: Melanie [name] is a very pleasant 85 yo African American female with adenocarcinoma of the descending colon that has recurred  and is now metastatic. She is tolerated treatment nicely so far and has no complaints at this time.  We will proceed with cycle two today as planned.  We will see her again in 2 weeks for follow-up.  She will contact our office with any questions or concerns. We can certainly see her sooner if needed.   Laverna Peace, NP 3/17/202110:40 AM

## 2019-08-14 NOTE — Addendum Note (Signed)
Addended by: Burney Gauze R on: 08/14/2019 11:09 AM   Modules accepted: Orders

## 2019-08-15 ENCOUNTER — Encounter: Payer: Self-pay | Admitting: Hematology & Oncology

## 2019-08-16 ENCOUNTER — Inpatient Hospital Stay: Payer: Medicare Other

## 2019-08-16 ENCOUNTER — Other Ambulatory Visit: Payer: Self-pay

## 2019-08-16 VITALS — BP 122/71 | HR 57 | Temp 97.3°F | Resp 18

## 2019-08-16 DIAGNOSIS — D500 Iron deficiency anemia secondary to blood loss (chronic): Secondary | ICD-10-CM

## 2019-08-16 DIAGNOSIS — Z5111 Encounter for antineoplastic chemotherapy: Secondary | ICD-10-CM | POA: Diagnosis not present

## 2019-08-16 DIAGNOSIS — C787 Secondary malignant neoplasm of liver and intrahepatic bile duct: Secondary | ICD-10-CM

## 2019-08-16 DIAGNOSIS — C189 Malignant neoplasm of colon, unspecified: Secondary | ICD-10-CM

## 2019-08-16 DIAGNOSIS — C186 Malignant neoplasm of descending colon: Secondary | ICD-10-CM

## 2019-08-16 MED ORDER — SODIUM CHLORIDE 0.9% FLUSH
3.0000 mL | Freq: Once | INTRAVENOUS | Status: DC | PRN
  Filled 2019-08-16: qty 10

## 2019-08-16 MED ORDER — SODIUM CHLORIDE 0.9% FLUSH
10.0000 mL | INTRAVENOUS | Status: DC | PRN
  Administered 2019-08-16: 10 mL
  Filled 2019-08-16: qty 10

## 2019-08-16 MED ORDER — SODIUM CHLORIDE 0.9 % IV SOLN
510.0000 mg | Freq: Once | INTRAVENOUS | Status: AC
  Administered 2019-08-16: 510 mg via INTRAVENOUS
  Filled 2019-08-16: qty 510

## 2019-08-16 MED ORDER — SODIUM CHLORIDE 0.9 % IV SOLN
INTRAVENOUS | Status: DC
  Filled 2019-08-16: qty 250

## 2019-08-16 MED ORDER — HEPARIN SOD (PORK) LOCK FLUSH 100 UNIT/ML IV SOLN
500.0000 [IU] | Freq: Once | INTRAVENOUS | Status: AC | PRN
  Administered 2019-08-16: 500 [IU]
  Filled 2019-08-16: qty 5

## 2019-08-23 ENCOUNTER — Ambulatory Visit: Payer: Medicare Other

## 2019-08-28 NOTE — Progress Notes (Signed)
Pharmacist Chemotherapy Monitoring - Follow Up Assessment    I verify that I have reviewed each item in the below checklist:  . Regimen for the patient is scheduled for the appropriate day and plan matches scheduled date. Marland Kitchen Appropriate non-routine labs are ordered dependent on drug ordered. . If applicable, additional medications reviewed and ordered per protocol based on lifetime cumulative doses and/or treatment regimen.   Plan for follow-up and/or issues identified: No . I-vent associated with next due treatment: No . MD and/or nursing notified: No  Melanie [name], Jacqlyn Larsen 08/28/2019 11:37 AM

## 2019-09-04 ENCOUNTER — Encounter: Payer: Self-pay | Admitting: Family

## 2019-09-04 ENCOUNTER — Ambulatory Visit: Payer: Medicare Other | Admitting: Hematology & Oncology

## 2019-09-04 ENCOUNTER — Other Ambulatory Visit: Payer: Self-pay

## 2019-09-04 ENCOUNTER — Inpatient Hospital Stay (HOSPITAL_BASED_OUTPATIENT_CLINIC_OR_DEPARTMENT_OTHER): Payer: Medicare Other | Admitting: Family

## 2019-09-04 ENCOUNTER — Ambulatory Visit: Payer: Medicare Other

## 2019-09-04 ENCOUNTER — Other Ambulatory Visit: Payer: Medicare Other

## 2019-09-04 ENCOUNTER — Inpatient Hospital Stay: Payer: Medicare Other

## 2019-09-04 ENCOUNTER — Inpatient Hospital Stay: Payer: Medicare Other | Attending: Hematology & Oncology

## 2019-09-04 ENCOUNTER — Other Ambulatory Visit: Payer: Self-pay | Admitting: Hematology & Oncology

## 2019-09-04 VITALS — BP 136/73 | HR 59 | Temp 98.4°F | Resp 16 | Ht 63.0 in | Wt 191.0 lb

## 2019-09-04 DIAGNOSIS — C787 Secondary malignant neoplasm of liver and intrahepatic bile duct: Secondary | ICD-10-CM

## 2019-09-04 DIAGNOSIS — C186 Malignant neoplasm of descending colon: Secondary | ICD-10-CM

## 2019-09-04 DIAGNOSIS — Z5111 Encounter for antineoplastic chemotherapy: Secondary | ICD-10-CM | POA: Diagnosis present

## 2019-09-04 DIAGNOSIS — D500 Iron deficiency anemia secondary to blood loss (chronic): Secondary | ICD-10-CM | POA: Diagnosis not present

## 2019-09-04 DIAGNOSIS — C189 Malignant neoplasm of colon, unspecified: Secondary | ICD-10-CM | POA: Diagnosis not present

## 2019-09-04 DIAGNOSIS — Z7952 Long term (current) use of systemic steroids: Secondary | ICD-10-CM | POA: Diagnosis not present

## 2019-09-04 DIAGNOSIS — R5383 Other fatigue: Secondary | ICD-10-CM | POA: Insufficient documentation

## 2019-09-04 DIAGNOSIS — Z79899 Other long term (current) drug therapy: Secondary | ICD-10-CM | POA: Insufficient documentation

## 2019-09-04 LAB — CBC WITH DIFFERENTIAL (CANCER CENTER ONLY)
Abs Immature Granulocytes: 0.32 10*3/uL — ABNORMAL HIGH (ref 0.00–0.07)
Basophils Absolute: 0.1 10*3/uL (ref 0.0–0.1)
Basophils Relative: 1 %
Eosinophils Absolute: 0.4 10*3/uL (ref 0.0–0.5)
Eosinophils Relative: 4 %
HCT: 34 % — ABNORMAL LOW (ref 36.0–46.0)
Hemoglobin: 11.1 g/dL — ABNORMAL LOW (ref 12.0–15.0)
Immature Granulocytes: 4 %
Lymphocytes Relative: 18 %
Lymphs Abs: 1.6 10*3/uL (ref 0.7–4.0)
MCH: 28.8 pg (ref 26.0–34.0)
MCHC: 32.6 g/dL (ref 30.0–36.0)
MCV: 88.3 fL (ref 80.0–100.0)
Monocytes Absolute: 1.8 10*3/uL — ABNORMAL HIGH (ref 0.1–1.0)
Monocytes Relative: 21 %
Neutro Abs: 4.6 10*3/uL (ref 1.7–7.7)
Neutrophils Relative %: 52 %
Platelet Count: 319 10*3/uL (ref 150–400)
RBC: 3.85 MIL/uL — ABNORMAL LOW (ref 3.87–5.11)
RDW: 20 % — ABNORMAL HIGH (ref 11.5–15.5)
WBC Count: 8.8 10*3/uL (ref 4.0–10.5)
nRBC: 0 % (ref 0.0–0.2)

## 2019-09-04 LAB — CMP (CANCER CENTER ONLY)
ALT: 4 U/L (ref 0–44)
AST: 17 U/L (ref 15–41)
Albumin: 3.7 g/dL (ref 3.5–5.0)
Alkaline Phosphatase: 152 U/L — ABNORMAL HIGH (ref 38–126)
Anion gap: 6 (ref 5–15)
BUN: 19 mg/dL (ref 8–23)
CO2: 25 mmol/L (ref 22–32)
Calcium: 9.4 mg/dL (ref 8.9–10.3)
Chloride: 107 mmol/L (ref 98–111)
Creatinine: 1.45 mg/dL — ABNORMAL HIGH (ref 0.44–1.00)
GFR, Est AFR Am: 39 mL/min — ABNORMAL LOW (ref >60)
GFR, Estimated: 33 mL/min — ABNORMAL LOW (ref >60)
Glucose, Bld: 104 mg/dL — ABNORMAL HIGH (ref 70–99)
Potassium: 3.7 mmol/L (ref 3.5–5.1)
Sodium: 138 mmol/L (ref 135–145)
Total Bilirubin: 0.4 mg/dL (ref 0.3–1.2)
Total Protein: 6.6 g/dL (ref 6.5–8.1)

## 2019-09-04 MED ORDER — SODIUM CHLORIDE 0.9 % IV SOLN
Freq: Once | INTRAVENOUS | Status: AC
  Filled 2019-09-04: qty 250

## 2019-09-04 MED ORDER — HEPARIN SOD (PORK) LOCK FLUSH 100 UNIT/ML IV SOLN
500.0000 [IU] | Freq: Once | INTRAVENOUS | Status: DC | PRN
  Filled 2019-09-04: qty 5

## 2019-09-04 MED ORDER — ATROPINE SULFATE 1 MG/ML IJ SOLN
INTRAMUSCULAR | Status: AC
  Filled 2019-09-04: qty 1

## 2019-09-04 MED ORDER — PALONOSETRON HCL INJECTION 0.25 MG/5ML
INTRAVENOUS | Status: AC
  Filled 2019-09-04: qty 5

## 2019-09-04 MED ORDER — SODIUM CHLORIDE 0.9 % IV SOLN
135.0000 mg/m2 | Freq: Once | INTRAVENOUS | Status: AC
  Administered 2019-09-04: 260 mg via INTRAVENOUS
  Filled 2019-09-04: qty 5

## 2019-09-04 MED ORDER — ATROPINE SULFATE 1 MG/ML IJ SOLN
0.5000 mg | Freq: Once | INTRAMUSCULAR | Status: AC | PRN
  Administered 2019-09-04: 0.5 mg via INTRAVENOUS

## 2019-09-04 MED ORDER — SODIUM CHLORIDE 0.9 % IV SOLN
400.0000 mg/m2 | Freq: Once | INTRAVENOUS | Status: AC
  Administered 2019-09-04: 772 mg via INTRAVENOUS
  Filled 2019-09-04: qty 38.6

## 2019-09-04 MED ORDER — SODIUM CHLORIDE 0.9 % IV SOLN
1920.0000 mg/m2 | INTRAVENOUS | Status: DC
  Administered 2019-09-04: 3700 mg via INTRAVENOUS
  Filled 2019-09-04: qty 74

## 2019-09-04 MED ORDER — PALONOSETRON HCL INJECTION 0.25 MG/5ML
0.2500 mg | Freq: Once | INTRAVENOUS | Status: AC
  Administered 2019-09-04: 0.25 mg via INTRAVENOUS

## 2019-09-04 MED ORDER — SODIUM CHLORIDE 0.9% FLUSH
10.0000 mL | INTRAVENOUS | Status: DC | PRN
  Filled 2019-09-04: qty 10

## 2019-09-04 MED ORDER — SODIUM CHLORIDE 0.9 % IV SOLN
10.0000 mg | Freq: Once | INTRAVENOUS | Status: AC
  Administered 2019-09-04: 10 mg via INTRAVENOUS
  Filled 2019-09-04: qty 10

## 2019-09-04 MED ORDER — FLUOROURACIL CHEMO INJECTION 2.5 GM/50ML
320.0000 mg/m2 | Freq: Once | INTRAVENOUS | Status: AC
  Administered 2019-09-04: 600 mg via INTRAVENOUS
  Filled 2019-09-04: qty 12

## 2019-09-04 NOTE — Progress Notes (Addendum)
Hematology and Oncology Follow Up Visit  Melanie [name] [id number] November 21, 1934 85 y.o. 09/04/2019   Principle Diagnosis:  Metastatic colon cancer -- progressive mets -- NO actionable mutation/LOW TMB/ MSI stable/ KRAS (+)/HER2-  Past Therapy: FOLFOX q 14 days s/p cycle 3 - DC'd secondary to non-tolerance RFA surgery - January 2019 Xeloda 1500 mg po BID (14/7) -- s/p c#4- start on 11/20/2018 -- d/c on 02/28/2019 Avastin 7.5 mg/kg IV q 3 week -- S/p c#4 -started on 06//23/2020 -- d/c on 02/28/2019 Stivarga 80 mg po q day (14 on/7 off) -- start on 04/24/2019-- d/c on 07/10/2019 due to progression  Current Therapy:        FOLFIRI -- started02/24/2021, s/p cycle 2   Interim History:  Melanie [name] is here today for follow-up and treatment. She is doing well and has no complaints at this time.  She has some occasional milt fatigue after treatment but otherwise has had no issues.  CEA 6 weeks ago was 41. Today's result is pending.  She has maintained a good appetite and is staying well hydrated. Her weight is stable.  No fever, chills, n/v, cough, rash, dizziness, SOB, chest pain, palpitations, abdominal pain or changes in bowel or bladder habits.  No swelling, tenderness, numbness or tingling in her extremities.  No falls or syncopal episodes to report.  She has not noted any blood loss. No bruising or petechiae.   ECOG Performance Status: 1 - Symptomatic but completely ambulatory  Medications:  Allergies as of 09/04/2019   No Known Allergies     Medication List       Accurate as of September 04, 2019 10:19 AM. If you have any questions, ask your nurse or doctor.        amlodipine-atorvastatin 10-10 MG tablet Commonly known as: CADUET Take 1 tablet by mouth daily.   aspirin 81 MG chewable tablet Chew by mouth daily.   CALTRATE 600+D PO Take 1 tablet by mouth at bedtime.   dexamethasone 4 MG tablet Commonly known as: DECADRON Take 2 tablets (8 mg total) by mouth  daily. Start the day after chemo for 2 days.   levothyroxine 75 MCG tablet Commonly known as: SYNTHROID Take 1 tablet (75 mcg total) by mouth daily before breakfast.   lidocaine-prilocaine cream Commonly known as: EMLA APPLY TO AFFECTED AREA AS DIRECTED AS NEEDED   loperamide 2 MG tablet Commonly known as: Imodium A-D Take 2 tablets (4 mg total) by mouth 3 (three) times daily as needed. Take 2 at diarrhea onset , then 1 every 2hr until 12hrs with no BM. May take 2 every 4hrs at night. If diarrhea recurs repeat.   LORazepam 1 MG tablet Commonly known as: Ativan Take 1 tablet (1 mg total) by mouth every 6 (six) hours as needed (NAUSEA).   meloxicam 7.5 MG tablet Commonly known as: Mobic Take 1 tablet (7.5 mg total) by mouth daily. You MUST take with food!!   metoprolol tartrate 25 MG tablet Commonly known as: LOPRESSOR Take 50 mg by mouth 2 (two) times daily.   multivitamin with minerals Tabs tablet Take 1 tablet by mouth daily.   naproxen 500 MG EC tablet Commonly known as: EC NAPROSYN Take 500 mg by mouth 2 (two) times daily with a meal.   ondansetron 4 MG tablet Commonly known as: Zofran Take 1 tablet (4 mg total) by mouth every 8 (eight) hours as needed for nausea or vomiting.   ondansetron 8 MG tablet Commonly known as: ZOFRAN Take 1  tablet (8 mg total) by mouth 2 (two) times daily as needed for refractory nausea / vomiting. Start on day 3 after chemotherapy.   potassium chloride SA 20 MEQ tablet Commonly known as: KLOR-CON Take 1 tablet (20 mEq total) by mouth daily.   prochlorperazine 10 MG tablet Commonly known as: COMPAZINE Take 1 tablet (10 mg total) by mouth every 6 (six) hours as needed (NAUSEA).   VITAMIN B-12 PO Take 1 tablet by mouth daily.       Allergies: No Known Allergies  Past Medical History, Surgical history, Social history, and Family History were reviewed and updated.  Review of Systems: All other 10 point review of systems is  negative.   Physical Exam:  height is 5' 3"  (1.6 m) and weight is 191 lb (86.6 kg). Her temporal temperature is 98.4 F (36.9 C). Her blood pressure is 136/73 and her pulse is 59 (abnormal). Her respiration is 16 and oxygen saturation is 98%.   Wt Readings from Last 3 Encounters:  09/04/19 191 lb (86.6 kg)  08/14/19 188 lb (85.3 kg)  07/24/19 186 lb 0.6 oz (84.4 kg)    Ocular: Sclerae unicteric, pupils equal, round and reactive to light Ear-nose-throat: Oropharynx clear, dentition fair Lymphatic: No cervical or supraclavicular adenopathy Lungs no rales or rhonchi, good excursion bilaterally Heart regular rate and rhythm, no murmur appreciated Abd soft, nontender, positive bowel sounds, no liver or spleen tip palpated on exam, no fluid wave  MSK no focal spinal tenderness, no joint edema Neuro: non-focal, well-oriented, appropriate affect Breasts: Deferred   Lab Results  Component Value Date   WBC 8.8 09/04/2019   HGB 11.1 (L) 09/04/2019   HCT 34.0 (L) 09/04/2019   MCV 88.3 09/04/2019   PLT 319 09/04/2019   Lab Results  Component Value Date   FERRITIN 414 (H) 08/14/2019   IRON 38 (L) 08/14/2019   TIBC 243 08/14/2019   UIBC 205 08/14/2019   IRONPCTSAT 16 (L) 08/14/2019   Lab Results  Component Value Date   RETICCTPCT 1.4 09/04/2018   RBC 3.85 (L) 09/04/2019   No results found for: KPAFRELGTCHN, LAMBDASER, KAPLAMBRATIO No results found for: IGGSERUM, IGA, IGMSERUM No results found for: Kathrynn Ducking, MSPIKE, SPEI   Chemistry      Component Value Date/Time   NA 138 08/14/2019 0945   NA 144 04/26/2017 0830   NA 137 09/30/2016 0940   K 3.8 08/14/2019 0945   K 4.1 04/26/2017 0830   K 4.7 09/30/2016 0940   CL 105 08/14/2019 0945   CL 108 04/26/2017 0830   CO2 27 08/14/2019 0945   CO2 27 04/26/2017 0830   CO2 18 (L) 09/30/2016 0940   BUN 16 08/14/2019 0945   BUN 25 (H) 04/26/2017 0830   BUN 21.5 09/30/2016 0940    CREATININE 1.38 (H) 08/14/2019 0945   CREATININE 1.5 (H) 04/26/2017 0830   CREATININE 1.8 (H) 09/30/2016 0940      Component Value Date/Time   CALCIUM 9.5 08/14/2019 0945   CALCIUM 9.9 04/26/2017 0830   CALCIUM 10.4 09/30/2016 0940   ALKPHOS 173 (H) 08/14/2019 0945   ALKPHOS 124 (H) 04/26/2017 0830   ALKPHOS 147 09/30/2016 0940   AST 30 08/14/2019 0945   AST 27 09/30/2016 0940   ALT 9 08/14/2019 0945   ALT 16 04/26/2017 0830   ALT 11 09/30/2016 0940   BILITOT 0.3 08/14/2019 0945   BILITOT 0.34 09/30/2016 0940       Impression and  Plan: Melanie [name] is a very pleasant 85 yo [place] American female with adenocarcinoma of the descending colon that has recurred and is now metastatic. She continues to tolerate treatment well and has no complaints at his time.  We will proceed with cycle 3 today as planned per Dr. Marin Olp and then see her again in another 3 weeks.  After cycle 4, we will repeat her CT scans.  She is in agreement with contact our office with any questions or concerns. We can certainly see her sooner if needed.   Laverna Peace, NP 4/7/202110:19 AM

## 2019-09-04 NOTE — Progress Notes (Signed)
Labs reviewed with MD. OK to treat despite counts.

## 2019-09-04 NOTE — Patient Instructions (Signed)
[facility] Discharge Instructions for Patients Receiving Chemotherapy  Today you received the following chemotherapy agents Irinotecan, Leucovorin, 5FU.  To help prevent nausea and vomiting after your treatment, we encourage you to take your nausea medication as indicated by your MD.   If you develop nausea and vomiting that is not controlled by your nausea medication, call the clinic.   BELOW ARE SYMPTOMS THAT SHOULD BE REPORTED IMMEDIATELY:  *FEVER GREATER THAN 100.5 F  *CHILLS WITH OR WITHOUT FEVER  NAUSEA AND VOMITING THAT IS NOT CONTROLLED WITH YOUR NAUSEA MEDICATION  *UNUSUAL SHORTNESS OF BREATH  *UNUSUAL BRUISING OR BLEEDING  TENDERNESS IN MOUTH AND THROAT WITH OR WITHOUT PRESENCE OF ULCERS  *URINARY PROBLEMS  *BOWEL PROBLEMS  UNUSUAL RASH Items with * indicate a potential emergency and should be followed up as soon as possible.  Feel free to call the clinic should you have any questions or concerns. The clinic phone number is [phone].  Please show the [facility] at check-in to the Emergency Department and triage nurse.

## 2019-09-05 LAB — IRON AND TIBC
Iron: 40 ug/dL — ABNORMAL LOW (ref 41–142)
Saturation Ratios: 19 % — ABNORMAL LOW (ref 21–57)
TIBC: 209 ug/dL — ABNORMAL LOW (ref 236–444)
UIBC: 168 ug/dL (ref 120–384)

## 2019-09-05 LAB — FERRITIN: Ferritin: 740 ng/mL — ABNORMAL HIGH (ref 11–307)

## 2019-09-05 LAB — CEA (IN HOUSE-CHCC): CEA (CHCC-In House): 112.38 ng/mL — ABNORMAL HIGH (ref 0.00–5.00)

## 2019-09-06 ENCOUNTER — Other Ambulatory Visit: Payer: Self-pay

## 2019-09-06 ENCOUNTER — Inpatient Hospital Stay: Payer: Medicare Other

## 2019-09-06 VITALS — BP 120/79 | HR 68 | Temp 97.4°F | Resp 17

## 2019-09-06 DIAGNOSIS — C186 Malignant neoplasm of descending colon: Secondary | ICD-10-CM

## 2019-09-06 DIAGNOSIS — Z5111 Encounter for antineoplastic chemotherapy: Secondary | ICD-10-CM | POA: Diagnosis not present

## 2019-09-06 DIAGNOSIS — C189 Malignant neoplasm of colon, unspecified: Secondary | ICD-10-CM

## 2019-09-06 DIAGNOSIS — C787 Secondary malignant neoplasm of liver and intrahepatic bile duct: Secondary | ICD-10-CM

## 2019-09-06 MED ORDER — SODIUM CHLORIDE 0.9% FLUSH
10.0000 mL | INTRAVENOUS | Status: DC | PRN
  Administered 2019-09-06: 10 mL
  Filled 2019-09-06: qty 10

## 2019-09-06 MED ORDER — HEPARIN SOD (PORK) LOCK FLUSH 100 UNIT/ML IV SOLN
500.0000 [IU] | Freq: Once | INTRAVENOUS | Status: AC | PRN
  Administered 2019-09-06: 15:00:00 500 [IU]
  Filled 2019-09-06: qty 5

## 2019-09-06 NOTE — Patient Instructions (Signed)
Fluorouracil, 5-FU injection What is this medicine? FLUOROURACIL, 5-FU (flure oh YOOR a sil) is a chemotherapy drug. It slows the growth of cancer cells. This medicine is used to treat many types of cancer like breast cancer, colon or rectal cancer, pancreatic cancer, and stomach cancer. This medicine may be used for other purposes; ask your health care provider or pharmacist if you have questions. COMMON BRAND NAME(S): Adrucil What should I tell my health care provider before I take this medicine? They need to know if you have any of these conditions:  blood disorders  dihydropyrimidine dehydrogenase (DPD) deficiency  infection (especially a virus infection such as chickenpox, cold sores, or herpes)  kidney disease  liver disease  malnourished, poor nutrition  recent or ongoing radiation therapy  an unusual or allergic reaction to fluorouracil, other chemotherapy, other medicines, foods, dyes, or preservatives  pregnant or trying to get pregnant  breast-feeding How should I use this medicine? This drug is given as an infusion or injection into a vein. It is administered in a hospital or clinic by a specially trained health care professional. Talk to your pediatrician regarding the use of this medicine in children. Special care may be needed. Overdosage: If you think you have taken too much of this medicine contact a poison control center or emergency room at once. NOTE: This medicine is only for you. Do not share this medicine with others. What if I miss a dose? It is important not to miss your dose. Call your doctor or health care professional if you are unable to keep an appointment. What may interact with this medicine?  allopurinol  cimetidine  dapsone  digoxin  hydroxyurea  leucovorin  levamisole  medicines for seizures like ethotoin, fosphenytoin, phenytoin  medicines to increase blood counts like filgrastim, pegfilgrastim, sargramostim  medicines that  treat or prevent blood clots like warfarin, enoxaparin, and dalteparin  methotrexate  metronidazole  pyrimethamine  some other chemotherapy drugs like busulfan, cisplatin, estramustine, vinblastine  trimethoprim  trimetrexate  vaccines Talk to your doctor or health care professional before taking any of these medicines:  acetaminophen  aspirin  ibuprofen  ketoprofen  naproxen This list may not describe all possible interactions. Give your health care provider a list of all the medicines, herbs, non-prescription drugs, or dietary supplements you use. Also tell them if you smoke, drink alcohol, or use illegal drugs. Some items may interact with your medicine. What should I watch for while using this medicine? Visit your doctor for checks on your progress. This drug may make you feel generally unwell. This is not uncommon, as chemotherapy can affect healthy cells as well as cancer cells. Report any side effects. Continue your course of treatment even though you feel ill unless your doctor tells you to stop. In some cases, you may be given additional medicines to help with side effects. Follow all directions for their use. Call your doctor or health care professional for advice if you get a fever, chills or sore throat, or other symptoms of a cold or flu. Do not treat yourself. This drug decreases your body's ability to fight infections. Try to avoid being around people who are sick. This medicine may increase your risk to bruise or bleed. Call your doctor or health care professional if you notice any unusual bleeding. Be careful brushing and flossing your teeth or using a toothpick because you may get an infection or bleed more easily. If you have any dental work done, tell your dentist you are   receiving this medicine. Avoid taking products that contain aspirin, acetaminophen, ibuprofen, naproxen, or ketoprofen unless instructed by your doctor. These medicines may hide a fever. Do not  become pregnant while taking this medicine. Women should inform their doctor if they wish to become pregnant or think they might be pregnant. There is a potential for serious side effects to an unborn child. Talk to your health care professional or pharmacist for more information. Do not breast-feed an infant while taking this medicine. Men should inform their doctor if they wish to father a child. This medicine may lower sperm counts. Do not treat diarrhea with over the counter products. Contact your doctor if you have diarrhea that lasts more than 2 days or if it is severe and watery. This medicine can make you more sensitive to the sun. Keep out of the sun. If you cannot avoid being in the sun, wear protective clothing and use sunscreen. Do not use sun lamps or tanning beds/booths. What side effects may I notice from receiving this medicine? Side effects that you should report to your doctor or health care professional as soon as possible:  allergic reactions like skin rash, itching or hives, swelling of the face, lips, or tongue  low blood counts - this medicine may decrease the number of white blood cells, red blood cells and platelets. You may be at increased risk for infections and bleeding.  signs of infection - fever or chills, cough, sore throat, pain or difficulty passing urine  signs of decreased platelets or bleeding - bruising, pinpoint red spots on the skin, black, tarry stools, blood in the urine  signs of decreased red blood cells - unusually weak or tired, fainting spells, lightheadedness  breathing problems  changes in vision  chest pain  mouth sores  nausea and vomiting  pain, swelling, redness at site where injected  pain, tingling, numbness in the hands or feet  redness, swelling, or sores on hands or feet  stomach pain  unusual bleeding Side effects that usually do not require medical attention (report to your doctor or health care professional if they  continue or are bothersome):  changes in finger or toe nails  diarrhea  dry or itchy skin  hair loss  headache  loss of appetite  sensitivity of eyes to the light  stomach upset  unusually teary eyes This list may not describe all possible side effects. Call your doctor for medical advice about side effects. You may report side effects to FDA at 1-800-FDA-1088. Where should I keep my medicine? This drug is given in a hospital or clinic and will not be stored at home. NOTE: This sheet is a summary. It may not cover all possible information. If you have questions about this medicine, talk to your doctor, pharmacist, or health care provider.  2020 Elsevier/Gold Standard (2007-09-19 13:53:16)  

## 2019-09-10 ENCOUNTER — Other Ambulatory Visit: Payer: Self-pay | Admitting: Hematology & Oncology

## 2019-09-10 ENCOUNTER — Encounter: Payer: Self-pay | Admitting: Hematology & Oncology

## 2019-09-11 ENCOUNTER — Other Ambulatory Visit: Payer: Self-pay | Admitting: *Deleted

## 2019-09-18 NOTE — Progress Notes (Signed)
Pharmacist Chemotherapy Monitoring - Follow Up Assessment    I verify that I have reviewed each item in the below checklist:  . Regimen for the patient is scheduled for the appropriate day and plan matches scheduled date. Marland Kitchen Appropriate non-routine labs are ordered dependent on drug ordered. . If applicable, additional medications reviewed and ordered per protocol based on lifetime cumulative doses and/or treatment regimen.   Plan for follow-up and/or issues identified: No . I-vent associated with next due treatment: No . MD and/or nursing notified: No  Phoua Hoadley, Jacqlyn Larsen 09/18/2019 8:20 AM

## 2019-09-25 ENCOUNTER — Encounter: Payer: Self-pay | Admitting: Hematology & Oncology

## 2019-09-25 ENCOUNTER — Inpatient Hospital Stay (HOSPITAL_BASED_OUTPATIENT_CLINIC_OR_DEPARTMENT_OTHER): Payer: Medicare Other | Admitting: Hematology & Oncology

## 2019-09-25 ENCOUNTER — Inpatient Hospital Stay: Payer: Medicare Other

## 2019-09-25 ENCOUNTER — Other Ambulatory Visit: Payer: Medicare Other

## 2019-09-25 ENCOUNTER — Ambulatory Visit: Payer: Medicare Other

## 2019-09-25 ENCOUNTER — Other Ambulatory Visit: Payer: Self-pay

## 2019-09-25 ENCOUNTER — Ambulatory Visit: Payer: Medicare Other | Admitting: Hematology & Oncology

## 2019-09-25 DIAGNOSIS — C186 Malignant neoplasm of descending colon: Secondary | ICD-10-CM | POA: Diagnosis not present

## 2019-09-25 DIAGNOSIS — D500 Iron deficiency anemia secondary to blood loss (chronic): Secondary | ICD-10-CM

## 2019-09-25 DIAGNOSIS — C787 Secondary malignant neoplasm of liver and intrahepatic bile duct: Secondary | ICD-10-CM

## 2019-09-25 DIAGNOSIS — Z5111 Encounter for antineoplastic chemotherapy: Secondary | ICD-10-CM | POA: Diagnosis not present

## 2019-09-25 DIAGNOSIS — C189 Malignant neoplasm of colon, unspecified: Secondary | ICD-10-CM

## 2019-09-25 LAB — CBC WITH DIFFERENTIAL (CANCER CENTER ONLY)
Abs Immature Granulocytes: 0.17 10*3/uL — ABNORMAL HIGH (ref 0.00–0.07)
Basophils Absolute: 0.1 10*3/uL (ref 0.0–0.1)
Basophils Relative: 1 %
Eosinophils Absolute: 0.4 10*3/uL (ref 0.0–0.5)
Eosinophils Relative: 5 %
HCT: 32.7 % — ABNORMAL LOW (ref 36.0–46.0)
Hemoglobin: 10.7 g/dL — ABNORMAL LOW (ref 12.0–15.0)
Immature Granulocytes: 2 %
Lymphocytes Relative: 16 %
Lymphs Abs: 1.5 10*3/uL (ref 0.7–4.0)
MCH: 29.2 pg (ref 26.0–34.0)
MCHC: 32.7 g/dL (ref 30.0–36.0)
MCV: 89.1 fL (ref 80.0–100.0)
Monocytes Absolute: 1.9 10*3/uL — ABNORMAL HIGH (ref 0.1–1.0)
Monocytes Relative: 21 %
Neutro Abs: 4.8 10*3/uL (ref 1.7–7.7)
Neutrophils Relative %: 55 %
Platelet Count: 340 10*3/uL (ref 150–400)
RBC: 3.67 MIL/uL — ABNORMAL LOW (ref 3.87–5.11)
RDW: 19.9 % — ABNORMAL HIGH (ref 11.5–15.5)
WBC Count: 8.9 10*3/uL (ref 4.0–10.5)
nRBC: 0 % (ref 0.0–0.2)

## 2019-09-25 LAB — CMP (CANCER CENTER ONLY)
ALT: 5 U/L (ref 0–44)
AST: 19 U/L (ref 15–41)
Albumin: 3.8 g/dL (ref 3.5–5.0)
Alkaline Phosphatase: 136 U/L — ABNORMAL HIGH (ref 38–126)
Anion gap: 7 (ref 5–15)
BUN: 18 mg/dL (ref 8–23)
CO2: 25 mmol/L (ref 22–32)
Calcium: 9.5 mg/dL (ref 8.9–10.3)
Chloride: 107 mmol/L (ref 98–111)
Creatinine: 1.35 mg/dL — ABNORMAL HIGH (ref 0.44–1.00)
GFR, Est AFR Am: 42 mL/min — ABNORMAL LOW (ref >60)
GFR, Estimated: 36 mL/min — ABNORMAL LOW (ref >60)
Glucose, Bld: 103 mg/dL — ABNORMAL HIGH (ref 70–99)
Potassium: 3.9 mmol/L (ref 3.5–5.1)
Sodium: 139 mmol/L (ref 135–145)
Total Bilirubin: 0.4 mg/dL (ref 0.3–1.2)
Total Protein: 6.9 g/dL (ref 6.5–8.1)

## 2019-09-25 LAB — IRON AND TIBC
Iron: 51 ug/dL (ref 41–142)
Saturation Ratios: 23 % (ref 21–57)
TIBC: 219 ug/dL — ABNORMAL LOW (ref 236–444)
UIBC: 168 ug/dL (ref 120–384)

## 2019-09-25 LAB — FERRITIN: Ferritin: 677 ng/mL — ABNORMAL HIGH (ref 11–307)

## 2019-09-25 MED ORDER — SODIUM CHLORIDE 0.9 % IV SOLN
Freq: Once | INTRAVENOUS | Status: AC
  Filled 2019-09-25: qty 250

## 2019-09-25 MED ORDER — FLUOROURACIL CHEMO INJECTION 2.5 GM/50ML
320.0000 mg/m2 | Freq: Once | INTRAVENOUS | Status: AC
  Administered 2019-09-25: 600 mg via INTRAVENOUS
  Filled 2019-09-25: qty 12

## 2019-09-25 MED ORDER — SODIUM CHLORIDE 0.9 % IV SOLN
510.0000 mg | Freq: Once | INTRAVENOUS | Status: AC
  Administered 2019-09-25: 510 mg via INTRAVENOUS
  Filled 2019-09-25: qty 510

## 2019-09-25 MED ORDER — PALONOSETRON HCL INJECTION 0.25 MG/5ML
0.2500 mg | Freq: Once | INTRAVENOUS | Status: AC
  Administered 2019-09-25: 0.25 mg via INTRAVENOUS

## 2019-09-25 MED ORDER — SODIUM CHLORIDE 0.9 % IV SOLN
400.0000 mg/m2 | Freq: Once | INTRAVENOUS | Status: AC
  Administered 2019-09-25: 772 mg via INTRAVENOUS
  Filled 2019-09-25: qty 38.6

## 2019-09-25 MED ORDER — SODIUM CHLORIDE 0.9 % IV SOLN
1920.0000 mg/m2 | INTRAVENOUS | Status: DC
  Administered 2019-09-25: 3700 mg via INTRAVENOUS
  Filled 2019-09-25: qty 74

## 2019-09-25 MED ORDER — SODIUM CHLORIDE 0.9 % IV SOLN
135.0000 mg/m2 | Freq: Once | INTRAVENOUS | Status: AC
  Administered 2019-09-25: 260 mg via INTRAVENOUS
  Filled 2019-09-25: qty 13

## 2019-09-25 MED ORDER — PALONOSETRON HCL INJECTION 0.25 MG/5ML
INTRAVENOUS | Status: AC
  Filled 2019-09-25: qty 5

## 2019-09-25 MED ORDER — SODIUM CHLORIDE 0.9 % IV SOLN
10.0000 mg | Freq: Once | INTRAVENOUS | Status: AC
  Administered 2019-09-25: 10 mg via INTRAVENOUS
  Filled 2019-09-25: qty 1

## 2019-09-25 NOTE — Progress Notes (Signed)
Hematology and Oncology Follow Up Visit  NASEEM ADLER [id number] 07/12/1934 85 y.o. 09/25/2019   Principle Diagnosis:  Metastatic colon cancer -- progressive mets -- NO actionable mutation/LOW TMB/ MSI stable/ KRAS (+)/HER2-  Past Therapy: FOLFOX q 14 days s/p cycle 3 - DC'd secondary to non-tolerance RFA surgery - January 2019 Xeloda 1500 mg po BID (14/7) -- s/p c#4- start on 11/20/2018 -- d/c on 02/28/2019 Avastin 7.5 mg/kg IV q 3 week -- S/p c#4 -started on 06//23/2020 -- d/c on 02/28/2019 Stivarga 80 mg po q day (14 on/7 off) -- start on 04/24/2019-- d/c on 07/10/2019 due to progression  Current Therapy:        FOLFIRI -- started02/24/2021, s/p cycle #3 IV Iron - Feraheme given on 09/25/2019   Interim History:  Ms. Postema is here today for follow-up and treatment.  So far, she is done pretty well with treatment.  It really has not bothered her all that much.  She went home for the Easter holiday back in early April.  She had a wonderful time.  A little bit concern is the fact that her CEA level went up to 112.  However, I have noted that her alkaline phosphatase has been coming down nicely.  There is been no abdominal pain.  She has had no nausea or vomiting.  There is been no diarrhea.  Iron studies were low back in early April.  Iron saturation was 70%.  We will give her a dose of Feraheme today.  We will set her up with CT scans when we see her back the next time.  Overall, her performance status is ECOG 1.  Medications:  Allergies as of 09/25/2019   No Known Allergies     Medication List       Accurate as of September 25, 2019  9:30 AM. If you have any questions, ask your nurse or doctor.        amlodipine-atorvastatin 10-10 MG tablet Commonly known as: CADUET Take 1 tablet by mouth daily.   aspirin 81 MG chewable tablet Chew by mouth daily.   CALTRATE 600+D PO Take 1 tablet by mouth at bedtime.   dexamethasone 4 MG tablet Commonly known  as: DECADRON Take 2 tablets (8 mg total) by mouth daily. Start the day after chemo for 2 days.   levothyroxine 75 MCG tablet Commonly known as: SYNTHROID TAKE 1 TABLET (75 MCG TOTAL) BY MOUTH DAILY BEFORE BREAKFAST.   lidocaine-prilocaine cream Commonly known as: EMLA APPLY TO AFFECTED AREA AS DIRECTED AS NEEDED   loperamide 2 MG tablet Commonly known as: Imodium A-D Take 2 tablets (4 mg total) by mouth 3 (three) times daily as needed. Take 2 at diarrhea onset , then 1 every 2hr until 12hrs with no BM. May take 2 every 4hrs at night. If diarrhea recurs repeat.   LORazepam 1 MG tablet Commonly known as: Ativan Take 1 tablet (1 mg total) by mouth every 6 (six) hours as needed (NAUSEA).   meloxicam 7.5 MG tablet Commonly known as: Mobic Take 1 tablet (7.5 mg total) by mouth daily. You MUST take with food!!   metoprolol tartrate 25 MG tablet Commonly known as: LOPRESSOR Take 50 mg by mouth 2 (two) times daily.   multivitamin with minerals Tabs tablet Take 1 tablet by mouth daily.   naproxen 500 MG EC tablet Commonly known as: EC NAPROSYN Take 500 mg by mouth 2 (two) times daily with a meal.   ondansetron 4 MG tablet Commonly known as:  Zofran Take 1 tablet (4 mg total) by mouth every 8 (eight) hours as needed for nausea or vomiting.   ondansetron 8 MG tablet Commonly known as: ZOFRAN Take 1 tablet (8 mg total) by mouth 2 (two) times daily as needed for refractory nausea / vomiting. Start on day 3 after chemotherapy.   potassium chloride SA 20 MEQ tablet Commonly known as: KLOR-CON Take 1 tablet (20 mEq total) by mouth daily.   prochlorperazine 10 MG tablet Commonly known as: COMPAZINE Take 1 tablet (10 mg total) by mouth every 6 (six) hours as needed (NAUSEA).   VITAMIN B-12 PO Take 1 tablet by mouth daily.       Allergies: No Known Allergies  Past Medical History, Surgical history, Social history, and Family History were reviewed and updated.  Review of  Systems: Review of Systems  Constitutional: Negative.   HENT: Negative.   Eyes: Negative.   Respiratory: Negative.   Cardiovascular: Negative.   Gastrointestinal: Negative.   Genitourinary: Negative.   Musculoskeletal: Negative.   Skin: Negative.   Neurological: Negative.   Endo/Heme/Allergies: Negative.   Psychiatric/Behavioral: Negative.      Physical Exam:  vitals were not taken for this visit.   Wt Readings from Last 3 Encounters:  09/25/19 191 lb 1.3 oz (86.7 kg)  09/04/19 191 lb (86.6 kg)  08/14/19 188 lb (85.3 kg)    Physical Exam Vitals reviewed.  HENT:     Head: Normocephalic and atraumatic.  Eyes:     Pupils: Pupils are equal, round, and reactive to light.  Cardiovascular:     Rate and Rhythm: Normal rate and regular rhythm.     Heart sounds: Normal heart sounds.  Pulmonary:     Effort: Pulmonary effort is normal.     Breath sounds: Normal breath sounds.  Abdominal:     General: Bowel sounds are normal.     Palpations: Abdomen is soft.  Musculoskeletal:        General: No tenderness or deformity. Normal range of motion.     Cervical back: Normal range of motion.  Lymphadenopathy:     Cervical: No cervical adenopathy.  Skin:    General: Skin is warm and dry.     Findings: No erythema or rash.  Neurological:     Mental Status: She is alert and oriented to person, place, and time.  Psychiatric:        Behavior: Behavior normal.        Thought Content: Thought content normal.        Judgment: Judgment normal.       Lab Results  Component Value Date   WBC 8.9 09/25/2019   HGB 10.7 (L) 09/25/2019   HCT 32.7 (L) 09/25/2019   MCV 89.1 09/25/2019   PLT 340 09/25/2019   Lab Results  Component Value Date   FERRITIN 740 (H) 09/04/2019   IRON 40 (L) 09/04/2019   TIBC 209 (L) 09/04/2019   UIBC 168 09/04/2019   IRONPCTSAT 19 (L) 09/04/2019   Lab Results  Component Value Date   RETICCTPCT 1.4 09/04/2018   RBC 3.67 (L) 09/25/2019   No results  found for: KPAFRELGTCHN, LAMBDASER, KAPLAMBRATIO No results found for: IGGSERUM, IGA, IGMSERUM No results found for: Ronnald Ramp, A1GS, A2GS, Violet Baldy MSPIKE, SPEI   Chemistry      Component Value Date/Time   NA 139 09/25/2019 0840   NA 144 04/26/2017 0830   NA 137 09/30/2016 0940   K 3.9 09/25/2019 0840  K 4.1 04/26/2017 0830   K 4.7 09/30/2016 0940   CL 107 09/25/2019 0840   CL 108 04/26/2017 0830   CO2 25 09/25/2019 0840   CO2 27 04/26/2017 0830   CO2 18 (L) 09/30/2016 0940   BUN 18 09/25/2019 0840   BUN 25 (H) 04/26/2017 0830   BUN 21.5 09/30/2016 0940   CREATININE 1.35 (H) 09/25/2019 0840   CREATININE 1.5 (H) 04/26/2017 0830   CREATININE 1.8 (H) 09/30/2016 0940      Component Value Date/Time   CALCIUM 9.5 09/25/2019 0840   CALCIUM 9.9 04/26/2017 0830   CALCIUM 10.4 09/30/2016 0940   ALKPHOS 136 (H) 09/25/2019 0840   ALKPHOS 124 (H) 04/26/2017 0830   ALKPHOS 147 09/30/2016 0940   AST 19 09/25/2019 0840   AST 27 09/30/2016 0940   ALT 5 09/25/2019 0840   ALT 16 04/26/2017 0830   ALT 11 09/30/2016 0940   BILITOT 0.4 09/25/2019 0840   BILITOT 0.34 09/30/2016 0940       Impression and Plan: Ms. Asebedo is a very pleasant 85 yo African American female with adenocarcinoma of the descending colon that has recurred and is now metastatic.  We will see what the CEA level is today.  If it is higher, then I will have to worry that she does have progressive disease.  We will set her up with scans when we see her back.  I will give her an extra week off from treatment.  I think this will definitely help her.  She would like to have the extra week off so she can go back to [facility].  Hopefully, the scan will show a response.  If not, I am not sure what else we would have to offer her.  Maybe 1 possibility would be to add back Avastin.  Again we will see her back in 1 month.    [name], MD 4/28/20219:30 AM

## 2019-09-25 NOTE — Patient Instructions (Signed)
[facility] Discharge Instructions for Patients Receiving Chemotherapy  Today you received the following chemotherapy agents Irinotecan, Leucovorin, 5FU and Feraheme  To help prevent nausea and vomiting after your treatment, we encourage you to take your nausea medication as prescribed by MD. **DO NOT TAKE ZOFRAN FOR 3 DAYS AFTER CHEMOTHERAPY TREATMENT**   If you develop nausea and vomiting that is not controlled by your nausea medication, call the clinic.   BELOW ARE SYMPTOMS THAT SHOULD BE REPORTED IMMEDIATELY:  *FEVER GREATER THAN 100.5 F  *CHILLS WITH OR WITHOUT FEVER  NAUSEA AND VOMITING THAT IS NOT CONTROLLED WITH YOUR NAUSEA MEDICATION  *UNUSUAL SHORTNESS OF BREATH  *UNUSUAL BRUISING OR BLEEDING  TENDERNESS IN MOUTH AND THROAT WITH OR WITHOUT PRESENCE OF ULCERS  *URINARY PROBLEMS  *BOWEL PROBLEMS  UNUSUAL RASH Items with * indicate a potential emergency and should be followed up as soon as possible.  Feel free to call the clinic should you have any questions or concerns. The clinic phone number is [phone].  Please show the [facility] at check-in to the Emergency Department and triage nurse.

## 2019-09-25 NOTE — Addendum Note (Signed)
Addended by: [name] on: 09/25/2019 09:51 AM   Modules accepted: Orders

## 2019-09-25 NOTE — Patient Instructions (Signed)

## 2019-09-27 ENCOUNTER — Other Ambulatory Visit: Payer: Self-pay

## 2019-09-27 ENCOUNTER — Inpatient Hospital Stay: Payer: Medicare Other

## 2019-09-27 VITALS — BP 125/66 | HR 62 | Temp 97.1°F | Resp 20

## 2019-09-27 DIAGNOSIS — C189 Malignant neoplasm of colon, unspecified: Secondary | ICD-10-CM

## 2019-09-27 DIAGNOSIS — Z5111 Encounter for antineoplastic chemotherapy: Secondary | ICD-10-CM | POA: Diagnosis not present

## 2019-09-27 DIAGNOSIS — C186 Malignant neoplasm of descending colon: Secondary | ICD-10-CM

## 2019-09-27 DIAGNOSIS — C787 Secondary malignant neoplasm of liver and intrahepatic bile duct: Secondary | ICD-10-CM

## 2019-09-27 MED ORDER — SODIUM CHLORIDE 0.9% FLUSH
10.0000 mL | INTRAVENOUS | Status: DC | PRN
  Administered 2019-09-27: 10 mL
  Filled 2019-09-27: qty 10

## 2019-09-27 MED ORDER — HEPARIN SOD (PORK) LOCK FLUSH 100 UNIT/ML IV SOLN
500.0000 [IU] | Freq: Once | INTRAVENOUS | Status: AC | PRN
  Administered 2019-09-27: 500 [IU]
  Filled 2019-09-27: qty 5

## 2019-09-27 NOTE — Patient Instructions (Signed)

## 2019-10-23 ENCOUNTER — Other Ambulatory Visit: Payer: Self-pay

## 2019-10-23 ENCOUNTER — Other Ambulatory Visit: Payer: Medicare Other

## 2019-10-23 ENCOUNTER — Encounter (HOSPITAL_BASED_OUTPATIENT_CLINIC_OR_DEPARTMENT_OTHER): Payer: Self-pay

## 2019-10-23 ENCOUNTER — Ambulatory Visit: Payer: Medicare Other

## 2019-10-23 ENCOUNTER — Inpatient Hospital Stay: Payer: Medicare Other

## 2019-10-23 ENCOUNTER — Ambulatory Visit: Payer: Medicare Other | Admitting: Hematology & Oncology

## 2019-10-23 ENCOUNTER — Ambulatory Visit (HOSPITAL_BASED_OUTPATIENT_CLINIC_OR_DEPARTMENT_OTHER)
Admission: RE | Admit: 2019-10-23 | Discharge: 2019-10-23 | Disposition: A | Discharge status: Home / Self Care | Payer: Medicare Other | Source: Ambulatory Visit | Attending: Hematology & Oncology | Admitting: Hematology & Oncology

## 2019-10-23 ENCOUNTER — Inpatient Hospital Stay: Payer: Medicare Other | Attending: Hematology & Oncology

## 2019-10-23 ENCOUNTER — Inpatient Hospital Stay (HOSPITAL_BASED_OUTPATIENT_CLINIC_OR_DEPARTMENT_OTHER): Payer: Medicare Other | Admitting: Hematology & Oncology

## 2019-10-23 ENCOUNTER — Encounter: Payer: Self-pay | Admitting: Hematology & Oncology

## 2019-10-23 ENCOUNTER — Telehealth: Payer: Self-pay | Admitting: Hematology & Oncology

## 2019-10-23 VITALS — BP 154/76 | HR 73 | Temp 96.9°F | Resp 19 | Wt 193.0 lb

## 2019-10-23 DIAGNOSIS — C186 Malignant neoplasm of descending colon: Secondary | ICD-10-CM | POA: Diagnosis present

## 2019-10-23 DIAGNOSIS — Z95828 Presence of other vascular implants and grafts: Secondary | ICD-10-CM

## 2019-10-23 DIAGNOSIS — R590 Localized enlarged lymph nodes: Secondary | ICD-10-CM | POA: Insufficient documentation

## 2019-10-23 DIAGNOSIS — Z79899 Other long term (current) drug therapy: Secondary | ICD-10-CM | POA: Diagnosis not present

## 2019-10-23 DIAGNOSIS — C787 Secondary malignant neoplasm of liver and intrahepatic bile duct: Secondary | ICD-10-CM | POA: Insufficient documentation

## 2019-10-23 LAB — CMP (CANCER CENTER ONLY)
ALT: 4 U/L (ref 0–44)
AST: 19 U/L (ref 15–41)
Albumin: 3.8 g/dL (ref 3.5–5.0)
Alkaline Phosphatase: 147 U/L — ABNORMAL HIGH (ref 38–126)
Anion gap: 8 (ref 5–15)
BUN: 17 mg/dL (ref 8–23)
CO2: 24 mmol/L (ref 22–32)
Calcium: 9.9 mg/dL (ref 8.9–10.3)
Chloride: 104 mmol/L (ref 98–111)
Creatinine: 1.31 mg/dL — ABNORMAL HIGH (ref 0.44–1.00)
GFR, Est AFR Am: 44 mL/min — ABNORMAL LOW (ref >60)
GFR, Estimated: 38 mL/min — ABNORMAL LOW (ref >60)
Glucose, Bld: 105 mg/dL — ABNORMAL HIGH (ref 70–99)
Potassium: 4 mmol/L (ref 3.5–5.1)
Sodium: 136 mmol/L (ref 135–145)
Total Bilirubin: 0.4 mg/dL (ref 0.3–1.2)
Total Protein: 7.7 g/dL (ref 6.5–8.1)

## 2019-10-23 LAB — CBC WITH DIFFERENTIAL (CANCER CENTER ONLY)
Abs Immature Granulocytes: 0.21 10*3/uL — ABNORMAL HIGH (ref 0.00–0.07)
Basophils Absolute: 0.1 10*3/uL (ref 0.0–0.1)
Basophils Relative: 1 %
Eosinophils Absolute: 0.5 10*3/uL (ref 0.0–0.5)
Eosinophils Relative: 4 %
HCT: 34.4 % — ABNORMAL LOW (ref 36.0–46.0)
Hemoglobin: 11.4 g/dL — ABNORMAL LOW (ref 12.0–15.0)
Immature Granulocytes: 2 %
Lymphocytes Relative: 13 %
Lymphs Abs: 1.8 10*3/uL (ref 0.7–4.0)
MCH: 30.3 pg (ref 26.0–34.0)
MCHC: 33.1 g/dL (ref 30.0–36.0)
MCV: 91.5 fL (ref 80.0–100.0)
Monocytes Absolute: 1.9 10*3/uL — ABNORMAL HIGH (ref 0.1–1.0)
Monocytes Relative: 14 %
Neutro Abs: 8.9 10*3/uL — ABNORMAL HIGH (ref 1.7–7.7)
Neutrophils Relative %: 66 %
Platelet Count: 274 10*3/uL (ref 150–400)
RBC: 3.76 MIL/uL — ABNORMAL LOW (ref 3.87–5.11)
RDW: 18.8 % — ABNORMAL HIGH (ref 11.5–15.5)
WBC Count: 13.4 10*3/uL — ABNORMAL HIGH (ref 4.0–10.5)
nRBC: 0 % (ref 0.0–0.2)

## 2019-10-23 LAB — IRON AND TIBC
Iron: 34 ug/dL — ABNORMAL LOW (ref 41–142)
Saturation Ratios: 15 % — ABNORMAL LOW (ref 21–57)
TIBC: 220 ug/dL — ABNORMAL LOW (ref 236–444)
UIBC: 187 ug/dL (ref 120–384)

## 2019-10-23 LAB — CEA (IN HOUSE-CHCC): CEA (CHCC-In House): 185.17 ng/mL — ABNORMAL HIGH (ref 0.00–5.00)

## 2019-10-23 LAB — FERRITIN: Ferritin: 872 ng/mL — ABNORMAL HIGH (ref 11–307)

## 2019-10-23 MED ORDER — IOHEXOL 300 MG/ML  SOLN
100.0000 mL | Freq: Once | INTRAMUSCULAR | Status: AC | PRN
  Administered 2019-10-23: 80 mL via INTRAVENOUS

## 2019-10-23 MED ORDER — HEPARIN SOD (PORK) LOCK FLUSH 100 UNIT/ML IV SOLN
500.0000 [IU] | Freq: Once | INTRAVENOUS | Status: AC
  Administered 2019-10-23: 500 [IU] via INTRAVENOUS
  Filled 2019-10-23: qty 5

## 2019-10-23 MED ORDER — SODIUM CHLORIDE 0.9% FLUSH
10.0000 mL | INTRAVENOUS | Status: AC | PRN
  Administered 2019-10-23: 10 mL via INTRAVENOUS
  Filled 2019-10-23: qty 10

## 2019-10-23 NOTE — Progress Notes (Signed)
Hematology and Oncology Follow Up Visit  MAYLI COVINGTON [id number] 1934/06/28 85 y.o. 10/23/2019   Principle Diagnosis:  Metastatic colon cancer -- progressive mets -- NO actionable mutation/LOW TMB/ MSI stable/ KRAS (+)/HER2-  Past Therapy: FOLFOX q 14 days s/p cycle 3 - DC'd secondary to non-tolerance RFA surgery - January 2019 Xeloda 1500 mg po BID (14/7) -- s/p c#4- start on 11/20/2018 -- d/c on 02/28/2019 Avastin 7.5 mg/kg IV q 3 week -- S/p c#4 -started on 06//23/2020 -- d/c on 02/28/2019 Stivarga 80 mg po q day (14 on/7 off) -- start on 04/24/2019-- d/c on 07/10/2019 due to progression  Current Therapy:        FOLFIRI -- started02/24/2021, s/p cycle #4 -- d/c on 10/23/2019 IV Iron - Feraheme given on 09/25/2019   Interim History:  Ms. Yott is here today for follow-up.  Unfortunately, she has progressive disease.  More last saw her, the CEA was up to 112.  We got her CAT scans done today.  Unfortunately CAT scan shows she has progression in her lungs.  She also has progression in her mesentery.  She has stable liver mets.  There is no evidence of bowel obstruction.  She has stable mediastinal and porta hepatis adenopathy.  Unfortunately, our treatment options really are minimal now.  We have not yet treated her with the oral agent-Lonsurf.  I realize this probably has less than a 20% chance of response.  There are significant toxicities with this.  She feels well.  She got back from [facility] this past weekend.  This is where she is from.  She was enjoys going out there.  There is no abdominal pain.  She seems to be eating well.  Her weight is not going down..  She has had no problems with nausea or vomiting.  There is been no problems with bowels or bladder.  She has had no bleeding.  There is been no leg swelling.  Her sister was listening on the cell phone as to what we were telling her.  Currently, her performance status is ECOG 1.  Medications:    Allergies as of 10/23/2019   No Known Allergies     Medication List       Accurate as of Oct 23, 2019 10:18 AM. If you have any questions, ask your nurse or doctor.        amlodipine-atorvastatin 10-10 MG tablet Commonly known as: CADUET Take 1 tablet by mouth daily.   aspirin 81 MG chewable tablet Chew by mouth daily.   CALTRATE 600+D PO Take 1 tablet by mouth at bedtime.   dexamethasone 4 MG tablet Commonly known as: DECADRON Take 2 tablets (8 mg total) by mouth daily. Start the day after chemo for 2 days.   levothyroxine 75 MCG tablet Commonly known as: SYNTHROID TAKE 1 TABLET (75 MCG TOTAL) BY MOUTH DAILY BEFORE BREAKFAST.   lidocaine-prilocaine cream Commonly known as: EMLA APPLY TO AFFECTED AREA AS DIRECTED AS NEEDED   loperamide 2 MG tablet Commonly known as: Imodium A-D Take 2 tablets (4 mg total) by mouth 3 (three) times daily as needed. Take 2 at diarrhea onset , then 1 every 2hr until 12hrs with no BM. May take 2 every 4hrs at night. If diarrhea recurs repeat.   LORazepam 1 MG tablet Commonly known as: Ativan Take 1 tablet (1 mg total) by mouth every 6 (six) hours as needed (NAUSEA).   meloxicam 7.5 MG tablet Commonly known as: Mobic Take 1 tablet (7.5  mg total) by mouth daily. You MUST take with food!!   metoprolol tartrate 25 MG tablet Commonly known as: LOPRESSOR Take 50 mg by mouth 2 (two) times daily.   multivitamin with minerals Tabs tablet Take 1 tablet by mouth daily.   naproxen 500 MG EC tablet Commonly known as: EC NAPROSYN Take 500 mg by mouth 2 (two) times daily with a meal.   ondansetron 4 MG tablet Commonly known as: Zofran Take 1 tablet (4 mg total) by mouth every 8 (eight) hours as needed for nausea or vomiting.   ondansetron 8 MG tablet Commonly known as: ZOFRAN Take 1 tablet (8 mg total) by mouth 2 (two) times daily as needed for refractory nausea / vomiting. Start on day 3 after chemotherapy.   potassium chloride SA 20 MEQ  tablet Commonly known as: KLOR-CON Take 1 tablet (20 mEq total) by mouth daily.   prochlorperazine 10 MG tablet Commonly known as: COMPAZINE Take 1 tablet (10 mg total) by mouth every 6 (six) hours as needed (NAUSEA).   VITAMIN B-12 PO Take 1 tablet by mouth daily.       Allergies: No Known Allergies  Past Medical History, Surgical history, Social history, and Family History were reviewed and updated.  Review of Systems: Review of Systems  Constitutional: Negative.   HENT: Negative.   Eyes: Negative.   Respiratory: Negative.   Cardiovascular: Negative.   Gastrointestinal: Negative.   Genitourinary: Negative.   Musculoskeletal: Negative.   Skin: Negative.   Neurological: Negative.   Endo/Heme/Allergies: Negative.   Psychiatric/Behavioral: Negative.      Physical Exam:  weight is 193 lb (87.5 kg). Her temporal temperature is 96.9 F (36.1 C) (abnormal). Her blood pressure is 154/76 (abnormal) and her pulse is 73. Her respiration is 19 and oxygen saturation is 96%.   Wt Readings from Last 3 Encounters:  10/23/19 193 lb (87.5 kg)  09/25/19 191 lb 1.3 oz (86.7 kg)  09/04/19 191 lb (86.6 kg)    Physical Exam Vitals reviewed.  HENT:     Head: Normocephalic and atraumatic.  Eyes:     Pupils: Pupils are equal, round, and reactive to light.  Cardiovascular:     Rate and Rhythm: Normal rate and regular rhythm.     Heart sounds: Normal heart sounds.  Pulmonary:     Effort: Pulmonary effort is normal.     Breath sounds: Normal breath sounds.  Abdominal:     General: Bowel sounds are normal.     Palpations: Abdomen is soft.  Musculoskeletal:        General: No tenderness or deformity. Normal range of motion.     Cervical back: Normal range of motion.  Lymphadenopathy:     Cervical: No cervical adenopathy.  Skin:    General: Skin is warm and dry.     Findings: No erythema or rash.  Neurological:     Mental Status: She is alert and oriented to person, place, and  time.  Psychiatric:        Behavior: Behavior normal.        Thought Content: Thought content normal.        Judgment: Judgment normal.       Lab Results  Component Value Date   WBC 13.4 (H) 10/23/2019   HGB 11.4 (L) 10/23/2019   HCT 34.4 (L) 10/23/2019   MCV 91.5 10/23/2019   PLT 274 10/23/2019   Lab Results  Component Value Date   FERRITIN 677 (H) 09/25/2019   IRON  51 09/25/2019   TIBC 219 (L) 09/25/2019   UIBC 168 09/25/2019   IRONPCTSAT 23 09/25/2019   Lab Results  Component Value Date   RETICCTPCT 1.4 09/04/2018   RBC 3.76 (L) 10/23/2019   No results found for: KPAFRELGTCHN, LAMBDASER, KAPLAMBRATIO No results found for: IGGSERUM, IGA, IGMSERUM No results found for: Kathrynn Ducking, MSPIKE, SPEI   Chemistry      Component Value Date/Time   NA 136 10/23/2019 0850   NA 144 04/26/2017 0830   NA 137 09/30/2016 0940   K 4.0 10/23/2019 0850   K 4.1 04/26/2017 0830   K 4.7 09/30/2016 0940   CL 104 10/23/2019 0850   CL 108 04/26/2017 0830   CO2 24 10/23/2019 0850   CO2 27 04/26/2017 0830   CO2 18 (L) 09/30/2016 0940   BUN 17 10/23/2019 0850   BUN 25 (H) 04/26/2017 0830   BUN 21.5 09/30/2016 0940   CREATININE 1.31 (H) 10/23/2019 0850   CREATININE 1.5 (H) 04/26/2017 0830   CREATININE 1.8 (H) 09/30/2016 0940      Component Value Date/Time   CALCIUM 9.9 10/23/2019 0850   CALCIUM 9.9 04/26/2017 0830   CALCIUM 10.4 09/30/2016 0940   ALKPHOS 147 (H) 10/23/2019 0850   ALKPHOS 124 (H) 04/26/2017 0830   ALKPHOS 147 09/30/2016 0940   AST 19 10/23/2019 0850   AST 27 09/30/2016 0940   ALT 4 10/23/2019 0850   ALT 16 04/26/2017 0830   ALT 11 09/30/2016 0940   BILITOT 0.4 10/23/2019 0850   BILITOT 0.34 09/30/2016 0940       Impression and Plan: Ms. Mace is a very pleasant 85 yo African American female with adenocarcinoma of the descending colon that has recurred and is now metastatic.  Right now, I think that our  options are the Lonsurf.  She does decide to try Lonsurf, I may go ahead and add Avastin to this.  If she does not wish to take Lonsurf, then I would clearly get Hospice involved.  I think this would be very reasonable.  Even though she is quite functional right now, I think getting hospice involved early would help with her quality of life and would certainly help with her life longevity.  Ms. Beasley has done everything we have asked her to do.  She really has done a nice job.  She has maintained a decent quality of life throughout treatment.  I gave her information about the Lonsurf.  She will talk things over with her family.  She will let us know.  I will try to get her back in about 6 weeks if she does not wish to take any treatment.  I really hate this for Ms. Antigua.  She is so nice.  She has fun to talk to.  She is such an inspiration to [place].    [name], MD 5/26/202110:18 AM

## 2019-10-23 NOTE — Addendum Note (Signed)
Addended by: Melton Krebs on: 10/23/2019 10:52 AM   Modules accepted: Orders, SmartSet

## 2019-10-23 NOTE — Patient Instructions (Signed)

## 2019-10-23 NOTE — Telephone Encounter (Signed)
Called and LMVM for [facility] regarding appointments that have been added.  Per 5/26 los

## 2019-10-24 ENCOUNTER — Encounter: Payer: Self-pay | Admitting: Hematology & Oncology

## 2019-10-25 ENCOUNTER — Inpatient Hospital Stay: Payer: Medicare Other

## 2019-10-29 ENCOUNTER — Other Ambulatory Visit: Payer: Self-pay | Admitting: Hematology & Oncology

## 2019-10-29 MED ORDER — LONSURF 20-8.19 MG PO TABS: ORAL_TABLET | ORAL | 4 refills | Status: DC

## 2019-10-30 ENCOUNTER — Other Ambulatory Visit: Payer: Self-pay | Admitting: Hematology & Oncology

## 2019-10-30 ENCOUNTER — Telehealth: Payer: Self-pay | Admitting: Pharmacy Technician

## 2019-10-30 DIAGNOSIS — C186 Malignant neoplasm of descending colon: Secondary | ICD-10-CM

## 2019-10-30 DIAGNOSIS — D500 Iron deficiency anemia secondary to blood loss (chronic): Secondary | ICD-10-CM

## 2019-10-30 NOTE — Telephone Encounter (Signed)
Oral Oncology Patient Advocate Encounter   Received notification from [facility] FEP) that prior authorization for Lonsurf is required.   PA submitted on CoverMyMeds Key [id number] Status is pending   Oral Oncology Clinic will continue to follow.  [place] Patient [place] Phone [phone] Fax [phone] 10/31/2019 12:46 PM

## 2019-10-31 ENCOUNTER — Telehealth: Payer: Self-pay | Admitting: Pharmacist

## 2019-10-31 DIAGNOSIS — C787 Secondary malignant neoplasm of liver and intrahepatic bile duct: Secondary | ICD-10-CM

## 2019-10-31 DIAGNOSIS — C189 Malignant neoplasm of colon, unspecified: Secondary | ICD-10-CM

## 2019-10-31 NOTE — Telephone Encounter (Signed)
Oral Oncology Patient Advocate Encounter  Prior Authorization for Melanie [name] has been approved.    PA# [id number] Effective dates: 10/01/19 through 10/30/20  Patient must use [facility].  Oral Oncology Clinic will continue to follow.   [place] Patient [name] Phone [phone] Fax [phone] 10/31/2019 12:56 PM

## 2019-10-31 NOTE — Telephone Encounter (Signed)
Oral Oncology Pharmacist Encounter  Received new prescription for Lonsurf for the treatment of metastatic colon cancer  in conjunction with bevacizumab, planned duration until disease progression or unacceptable drug toxicity.  CMP from 10/23/19 assessed, no relevant lab abnormalities. Prescription dose and frequency assessed.   Current medication list in Epic reviewed, no DDIs with Lonsurf identified.  Prescription has been e-scribed to the [facility] for benefits analysis and approval.  Oral Oncology Clinic will continue to follow for insurance authorization, copayment issues, initial counseling and start date.  Darl Pikes, PharmD, BCPS, BCOP, CPP Hematology/Oncology Clinical Pharmacist Practitioner [facility] [phone]  10/31/2019 12:08 PM

## 2019-11-01 MED ORDER — LONSURF 20-8.19 MG PO TABS: 60.0000 mg | ORAL_TABLET | Freq: Two times a day (BID) | ORAL | 4 refills | Status: DC

## 2019-11-01 NOTE — Telephone Encounter (Signed)
Oral Chemotherapy Pharmacist Encounter  Due to insurance restriction the medication could not be filled at [facility]. Prescription has been e-scribed to [facility].  Supportive information was faxed to [facility]. We will continue to follow medication access.   Darl Pikes, PharmD, BCPS, [facility] Hematology/Oncology Clinical Pharmacist [facility] Oral [facility] [phone]  11/01/2019 2:50 PM

## 2019-11-01 NOTE — Telephone Encounter (Signed)
Oral Chemotherapy Pharmacist Encounter  Ms. Mayo (patient's sister) called, I let her know that the Losurf prescription was sent out to [facility].   Patient Education I spoke with Ms. Mayo for overview of new oral chemotherapy medication: Lonsurf for the treatment of metastatic colon cancer in conjunction with bevacizumab, planned duration until disease progression or unacceptable drug toxicity.   Counseled Ms. Mayo on administration, dosing, side effects, monitoring, drug-food interactions, safe handling, storage, and disposal. Patient will take 3 tablets (60 mg of trifluridine total) by mouth 2 (two) times daily after a meal. Take 1 hr after AM & PM meals on days 1-5, 8-12. Repeat every 28day.  Side effects include but not limited to: diarrhea, decreased wbc, fatigue, N/V.    Reviewed with Ms. Mayo importance of keeping a medication schedule and plan for any missed doses.  Ms. Brett Albino voiced understanding and appreciation. All questions answered. Medication handout placed in the mail.  Provided Ms. Mayo with Oral Chemotherapy [facility] Clinic phone number. Ms. Brett Albino knows to call the office with questions or concerns. Oral Chemotherapy Navigation Clinic will continue to follow.  Darl Pikes, PharmD, BCPS, BCOP, CPP Hematology/Oncology Clinical Pharmacist Practitioner [facility] [phone]  11/01/2019 2:51 PM

## 2019-11-05 ENCOUNTER — Telehealth: Payer: Self-pay | Admitting: Pharmacy Technician

## 2019-11-05 NOTE — Telephone Encounter (Signed)
Patients copay with [facility] is $85.  I obtained a copay card and gave that information to the pharmacy.  Will follow up with [facility] for delivery status.

## 2019-11-05 NOTE — Telephone Encounter (Signed)
Oral Oncology Patient Advocate Encounter   Was successful in obtaining a copay card for Lonsurf.  This copay card will make the patients copay $0.00.  I have spoken with the patient.    The billing information is as follows and has been shared with [facility].   RxBin: [id number] PCN: [id number] Member ID: [id number] Group ID: [id number]  [place] Nancy [place] Patient [place] Phone [phone] Fax [phone] 11/05/2019 2:50 PM

## 2019-11-12 NOTE — Telephone Encounter (Signed)
Lonsurf was delivered on 11/08/19.

## 2019-12-05 ENCOUNTER — Inpatient Hospital Stay (HOSPITAL_BASED_OUTPATIENT_CLINIC_OR_DEPARTMENT_OTHER): Payer: Medicare Other | Admitting: Hematology & Oncology

## 2019-12-05 ENCOUNTER — Encounter: Payer: Self-pay | Admitting: Hematology & Oncology

## 2019-12-05 ENCOUNTER — Other Ambulatory Visit: Payer: Self-pay

## 2019-12-05 ENCOUNTER — Inpatient Hospital Stay: Payer: Medicare Other | Attending: Hematology & Oncology

## 2019-12-05 ENCOUNTER — Inpatient Hospital Stay: Payer: Medicare Other

## 2019-12-05 VITALS — BP 123/69 | HR 64 | Resp 20

## 2019-12-05 VITALS — BP 134/85 | HR 69 | Temp 99.5°F | Resp 20 | Wt 183.4 lb

## 2019-12-05 DIAGNOSIS — D500 Iron deficiency anemia secondary to blood loss (chronic): Secondary | ICD-10-CM

## 2019-12-05 DIAGNOSIS — C186 Malignant neoplasm of descending colon: Secondary | ICD-10-CM | POA: Insufficient documentation

## 2019-12-05 DIAGNOSIS — Z79899 Other long term (current) drug therapy: Secondary | ICD-10-CM | POA: Diagnosis not present

## 2019-12-05 DIAGNOSIS — C189 Malignant neoplasm of colon, unspecified: Secondary | ICD-10-CM | POA: Diagnosis not present

## 2019-12-05 DIAGNOSIS — C787 Secondary malignant neoplasm of liver and intrahepatic bile duct: Secondary | ICD-10-CM | POA: Insufficient documentation

## 2019-12-05 DIAGNOSIS — R05 Cough: Secondary | ICD-10-CM | POA: Diagnosis not present

## 2019-12-05 LAB — CBC WITH DIFFERENTIAL (CANCER CENTER ONLY)
Abs Immature Granulocytes: 0.02 10*3/uL (ref 0.00–0.07)
Basophils Absolute: 0 10*3/uL (ref 0.0–0.1)
Basophils Relative: 0 %
Eosinophils Absolute: 0.1 10*3/uL (ref 0.0–0.5)
Eosinophils Relative: 2 %
HCT: 28.5 % — ABNORMAL LOW (ref 36.0–46.0)
Hemoglobin: 9.3 g/dL — ABNORMAL LOW (ref 12.0–15.0)
Immature Granulocytes: 0 %
Lymphocytes Relative: 25 %
Lymphs Abs: 1.3 10*3/uL (ref 0.7–4.0)
MCH: 30.8 pg (ref 26.0–34.0)
MCHC: 32.6 g/dL (ref 30.0–36.0)
MCV: 94.4 fL (ref 80.0–100.0)
Monocytes Absolute: 1 10*3/uL (ref 0.1–1.0)
Monocytes Relative: 20 %
Neutro Abs: 2.8 10*3/uL (ref 1.7–7.7)
Neutrophils Relative %: 53 %
Platelet Count: 339 10*3/uL (ref 150–400)
RBC: 3.02 MIL/uL — ABNORMAL LOW (ref 3.87–5.11)
RDW: 17.5 % — ABNORMAL HIGH (ref 11.5–15.5)
WBC Count: 5.2 10*3/uL (ref 4.0–10.5)
nRBC: 0 % (ref 0.0–0.2)

## 2019-12-05 LAB — CMP (CANCER CENTER ONLY)
ALT: 2 U/L (ref 0–44)
AST: 15 U/L (ref 15–41)
Albumin: 3.8 g/dL (ref 3.5–5.0)
Alkaline Phosphatase: 125 U/L (ref 38–126)
Anion gap: 6 (ref 5–15)
BUN: 16 mg/dL (ref 8–23)
CO2: 23 mmol/L (ref 22–32)
Calcium: 9.9 mg/dL (ref 8.9–10.3)
Chloride: 107 mmol/L (ref 98–111)
Creatinine: 1.27 mg/dL — ABNORMAL HIGH (ref 0.44–1.00)
GFR, Est AFR Am: 46 mL/min — ABNORMAL LOW (ref >60)
GFR, Estimated: 39 mL/min — ABNORMAL LOW (ref >60)
Glucose, Bld: 102 mg/dL — ABNORMAL HIGH (ref 70–99)
Potassium: 4.1 mmol/L (ref 3.5–5.1)
Sodium: 136 mmol/L (ref 135–145)
Total Bilirubin: 0.4 mg/dL (ref 0.3–1.2)
Total Protein: 7.5 g/dL (ref 6.5–8.1)

## 2019-12-05 MED ORDER — HEPARIN SOD (PORK) LOCK FLUSH 100 UNIT/ML IV SOLN
500.0000 [IU] | Freq: Once | INTRAVENOUS | Status: DC
  Filled 2019-12-05: qty 5

## 2019-12-05 MED ORDER — SODIUM CHLORIDE 0.9 % IV SOLN
INTRAVENOUS | Status: DC
  Filled 2019-12-05: qty 250

## 2019-12-05 MED ORDER — SODIUM CHLORIDE 0.9 % IV SOLN
510.0000 mg | Freq: Once | INTRAVENOUS | Status: AC
  Administered 2019-12-05: 510 mg via INTRAVENOUS
  Filled 2019-12-05: qty 17

## 2019-12-05 MED ORDER — SODIUM CHLORIDE 0.9% FLUSH
10.0000 mL | Freq: Once | INTRAVENOUS | Status: DC
  Filled 2019-12-05: qty 10

## 2019-12-05 MED ORDER — HEPARIN SOD (PORK) LOCK FLUSH 100 UNIT/ML IV SOLN
500.0000 [IU] | Freq: Once | INTRAVENOUS | Status: AC
  Administered 2019-12-05: 500 [IU] via INTRAVENOUS
  Filled 2019-12-05: qty 5

## 2019-12-05 MED ORDER — SODIUM CHLORIDE 0.9% FLUSH
10.0000 mL | Freq: Once | INTRAVENOUS | Status: AC
  Administered 2019-12-05: 10 mL
  Filled 2019-12-05: qty 10

## 2019-12-05 NOTE — Progress Notes (Signed)
Patient does not want to stay for the 30 minute post IV iron observation. Patient discharged ambulatory without complaints or concerns.  

## 2019-12-05 NOTE — Patient Instructions (Signed)

## 2019-12-05 NOTE — Patient Instructions (Signed)

## 2019-12-05 NOTE — Progress Notes (Signed)
Hematology and Oncology Follow Up Visit  DAVINE SWENEY [id number] 04-07-1935 85 y.o. 12/05/2019   Principle Diagnosis:  Metastatic colon cancer -- progressive mets -- NO actionable mutation/LOW TMB/ MSI stable/ KRAS (+)/HER2-  Past Therapy: FOLFOX q 14 days s/p cycle 3 - DC'd secondary to non-tolerance RFA surgery - January 2019 Xeloda 1500 mg po BID (14/7) -- s/p c#4- start on 11/20/2018 -- d/c on 02/28/2019 Avastin 7.5 mg/kg IV q 3 week -- S/p c#4 -started on 06//23/2020 -- d/c on 02/28/2019 Stivarga 80 mg po q day (14 on/7 off) -- start on 04/24/2019-- d/c on 07/10/2019 due to progression  Current Therapy:        FOLFIRI -- started02/24/2021, s/p cycle #4 -- d/c on 10/23/2019 IV Iron - Feraheme given on 09/25/2019 Lonsurf 60 mg po bid (2 week on/ 2 weeks off)    Interim History:  Ms. Dejarnett is here today for follow-up.  When we last saw her, she had progressive disease by CT scan.  She decided to try Lonsurf.  She has been on Lonsurf now for about a month.  She is done well with it so far.  There is been no diarrhea.  She has had no mouth sores.  There is no rashes.  She has had no nausea or vomiting.  She has little bit of a cough.  It is nonproductive.  We get some to try her on some Tessalon perles to see if this may help the cough..  She does have a little bit of sinus drainage.  I told her to try some over-the-counter Claritin or Zyrtec.  Her last CEA level was 185 back in May.  It will be interesting to see what this 1 is.  She and her sister had a very nice July 4 weekend.  They had a cookout with their brother.  She is eating okay.  She has had no mouth sores.  She has had no bleeding..  There might be a little bit of leg swelling but this is chronic.  Overall, her performance status is ECOG 1.  Medications:  Allergies as of 12/05/2019   No Known Allergies     Medication List       Accurate as of December 05, 2019 11:21 AM. If you have any questions,  ask your nurse or doctor.        amlodipine-atorvastatin 10-10 MG tablet Commonly known as: CADUET Take 1 tablet by mouth daily.   aspirin 81 MG chewable tablet Chew by mouth daily.   CALTRATE 600+D PO Take 1 tablet by mouth at bedtime.   levothyroxine 75 MCG tablet Commonly known as: SYNTHROID TAKE 1 TABLET (75 MCG TOTAL) BY MOUTH DAILY BEFORE BREAKFAST.   lidocaine-prilocaine cream Commonly known as: EMLA APPLY TO AFFECTED AREA AS DIRECTED AS NEEDED   Lonsurf 20-8.19 MG tablet Generic drug: trifluridine-tipiracil Take 3 tablets (60 mg of trifluridine total) by mouth 2 (two) times daily after a meal. Take 1 hr after AM & PM meals on days 1-5, 8-12. Repeat every 28day   meloxicam 7.5 MG tablet Commonly known as: Mobic Take 1 tablet (7.5 mg total) by mouth daily. You MUST take with food!!   metoprolol tartrate 25 MG tablet Commonly known as: LOPRESSOR Take 50 mg by mouth 2 (two) times daily.   multivitamin with minerals Tabs tablet Take 1 tablet by mouth daily.   naproxen 500 MG EC tablet Commonly known as: EC NAPROSYN Take 500 mg by mouth 2 (two) times daily with  a meal.   ondansetron 4 MG tablet Commonly known as: Zofran Take 1 tablet (4 mg total) by mouth every 8 (eight) hours as needed for nausea or vomiting.   potassium chloride SA 20 MEQ tablet Commonly known as: KLOR-CON Take 1 tablet (20 mEq total) by mouth daily.   VITAMIN B-12 PO Take 1 tablet by mouth daily.       Allergies: No Known Allergies  Past Medical History, Surgical history, Social history, and Family History were reviewed and updated.  Review of Systems: Review of Systems  Constitutional: Negative.   HENT: Negative.   Eyes: Negative.   Respiratory: Negative.   Cardiovascular: Negative.   Gastrointestinal: Negative.   Genitourinary: Negative.   Musculoskeletal: Negative.   Skin: Negative.   Neurological: Negative.   Endo/Heme/Allergies: Negative.   Psychiatric/Behavioral:  Negative.      Physical Exam:  weight is 183 lb 6.4 oz (83.2 kg). Her oral temperature is 99.5 F (37.5 C). Her blood pressure is 134/85 and her pulse is 69. Her respiration is 20 and oxygen saturation is 100%.   Wt Readings from Last 3 Encounters:  12/05/19 183 lb 6.4 oz (83.2 kg)  10/23/19 193 lb (87.5 kg)  09/25/19 191 lb 1.3 oz (86.7 kg)    Physical Exam Vitals reviewed.  HENT:     Head: Normocephalic and atraumatic.  Eyes:     Pupils: Pupils are equal, round, and reactive to light.  Cardiovascular:     Rate and Rhythm: Normal rate and regular rhythm.     Heart sounds: Normal heart sounds.  Pulmonary:     Effort: Pulmonary effort is normal.     Breath sounds: Normal breath sounds.  Abdominal:     General: Bowel sounds are normal.     Palpations: Abdomen is soft.  Musculoskeletal:        General: No tenderness or deformity. Normal range of motion.     Cervical back: Normal range of motion.  Lymphadenopathy:     Cervical: No cervical adenopathy.  Skin:    General: Skin is warm and dry.     Findings: No erythema or rash.  Neurological:     Mental Status: She is alert and oriented to person, place, and time.  Psychiatric:        Behavior: Behavior normal.        Thought Content: Thought content normal.        Judgment: Judgment normal.       Lab Results  Component Value Date   WBC 5.2 12/05/2019   HGB 9.3 (L) 12/05/2019   HCT 28.5 (L) 12/05/2019   MCV 94.4 12/05/2019   PLT 339 12/05/2019   Lab Results  Component Value Date   FERRITIN 872 (H) 10/23/2019   IRON 34 (L) 10/23/2019   TIBC 220 (L) 10/23/2019   UIBC 187 10/23/2019   IRONPCTSAT 15 (L) 10/23/2019   Lab Results  Component Value Date   RETICCTPCT 1.4 09/04/2018   RBC 3.02 (L) 12/05/2019   No results found for: KPAFRELGTCHN, LAMBDASER, KAPLAMBRATIO No results found for: IGGSERUM, IGA, IGMSERUM No results found for: TOTALPROTELP, ALBUMINELP, A1GS, A2GS, BETS, BETA2SER, GAMS, MSPIKE, SPEI    Chemistry      Component Value Date/Time   NA 136 12/05/2019 1040   NA 144 04/26/2017 0830   NA 137 09/30/2016 0940   K 4.1 12/05/2019 1040   K 4.1 04/26/2017 0830   K 4.7 09/30/2016 0940   CL 107 12/05/2019 1040   CL   108 04/26/2017 0830   CO2 23 12/05/2019 1040   CO2 27 04/26/2017 0830   CO2 18 (L) 09/30/2016 0940   BUN 16 12/05/2019 1040   BUN 25 (H) 04/26/2017 0830   BUN 21.5 09/30/2016 0940   CREATININE 1.27 (H) 12/05/2019 1040   CREATININE 1.5 (H) 04/26/2017 0830   CREATININE 1.8 (H) 09/30/2016 0940      Component Value Date/Time   CALCIUM 9.9 12/05/2019 1040   CALCIUM 9.9 04/26/2017 0830   CALCIUM 10.4 09/30/2016 0940   ALKPHOS 125 12/05/2019 1040   ALKPHOS 124 (H) 04/26/2017 0830   ALKPHOS 147 09/30/2016 0940   AST 15 12/05/2019 1040   AST 27 09/30/2016 0940   ALT 2 12/05/2019 1040   ALT 16 04/26/2017 0830   ALT 11 09/30/2016 0940   BILITOT 0.4 12/05/2019 1040   BILITOT 0.34 09/30/2016 0940       Impression and Plan: Ms. Merkin is a very pleasant 84 yo African American female with adenocarcinoma of the descending colon that has recurred and is now metastatic.  Hopefully, Lonsurf will help her.  I think it is still early to know if it will help.  At least, she not having any toxicity.  We will have to give her iron today.  When she was last her iron saturation was only 15%.  Her hemoglobin is down a little bit.  I think IV iron would help with her quality of life.  We will plan to get her back in another month.  I probably would not repeat a scan until after Labor Day.  I think the CEA level will definitely tell us what is going on inside.      Peter R Ennever, MD 7/8/202111:21 AM 

## 2019-12-06 LAB — CEA (IN HOUSE-CHCC): CEA (CHCC-In House): 211.29 ng/mL — ABNORMAL HIGH (ref 0.00–5.00)

## 2019-12-23 IMAGING — CT CT ABD-PELV W/ CM
2 of 5 series · 12 of 36 positions shown, 15 images · IV contrast (Omnipaque)
Comparison: PET-CT, 10/11/2018, 01/01/2018

CLINICAL DATA: Metastatic colon cancer, rising CEA on adjuvant
therapy

EXAM:
CT CHEST, ABDOMEN, AND PELVIS WITH CONTRAST
TECHNIQUE: Multidetector CT imaging of the chest, abdomen and pelvis was
performed following the standard protocol during bolus
administration of intravenous contrast.
CONTRAST:  80mL OMNIPAQUE IOHEXOL 300 MG/ML SOLN, additional oral
enteric contrast

[Series 2: cap with 2 · axial · 0.93mm/px · z∈[-617,-102]mm · 9 of 129 slices shown, 12 images]
[im 13/129  mediastinal]
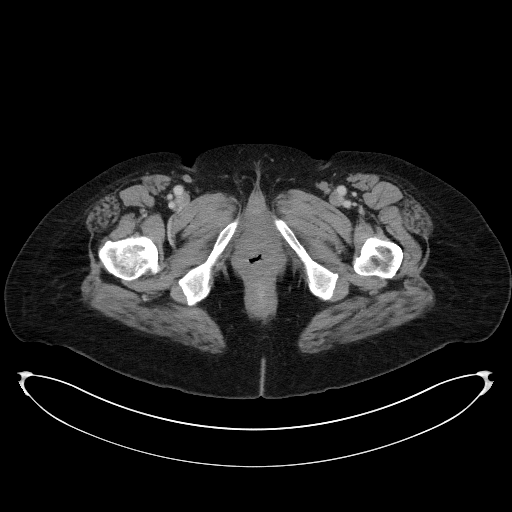
[im 13/129  lung]
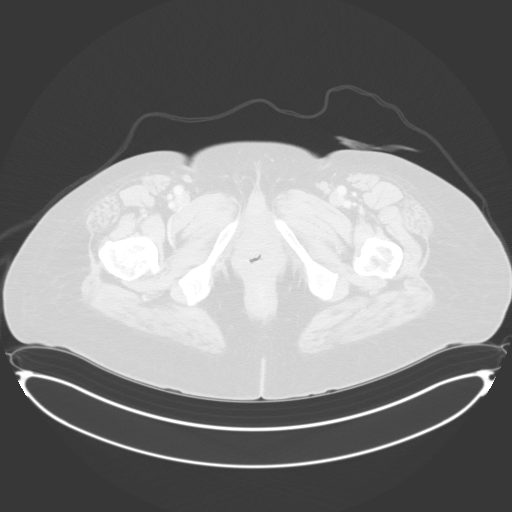
[im 26/129  lung]
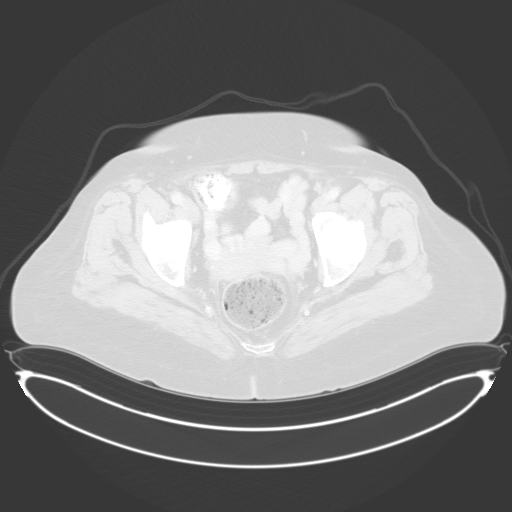
[im 39/129  lung]
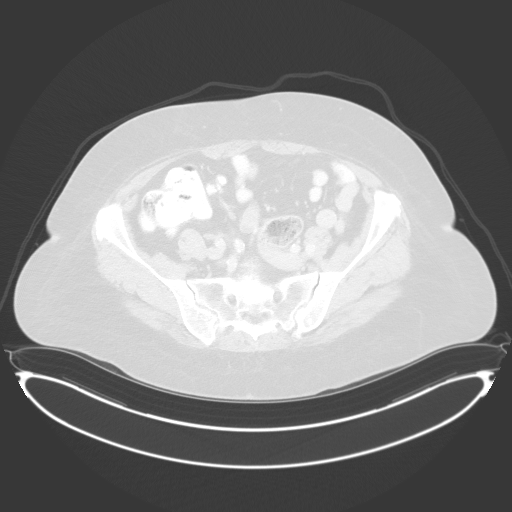
[im 52/129  lung]
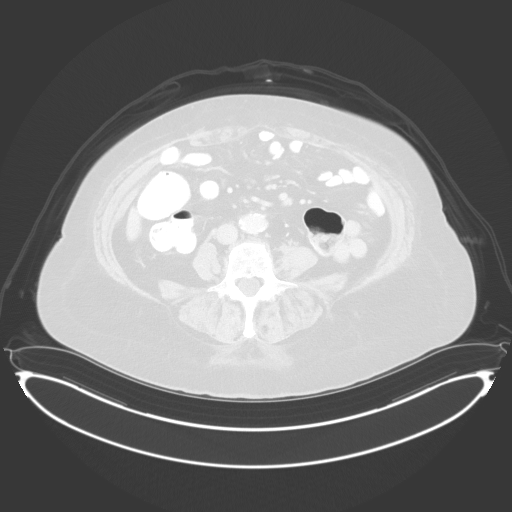
[im 65/129  mediastinal]
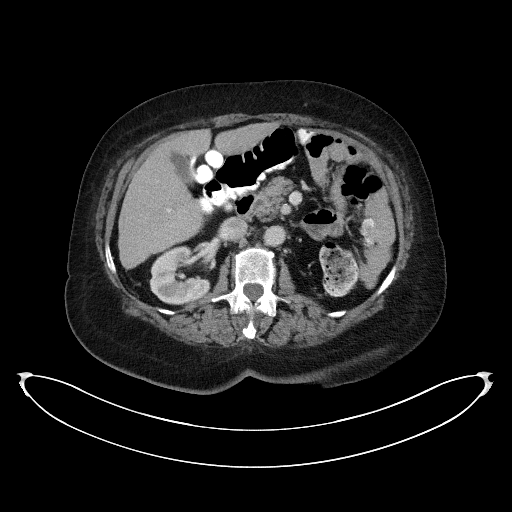
[im 65/129  lung]
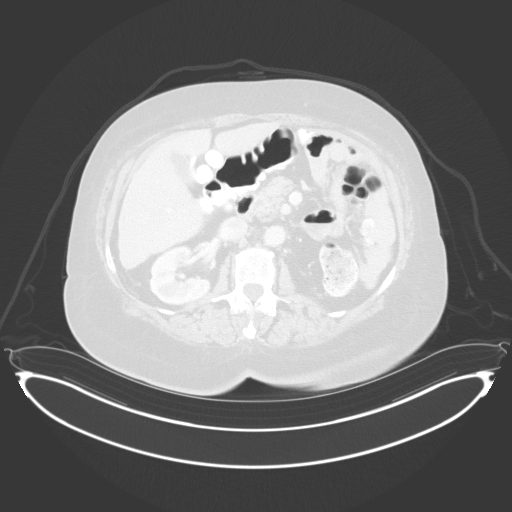
[im 77/129  lung]
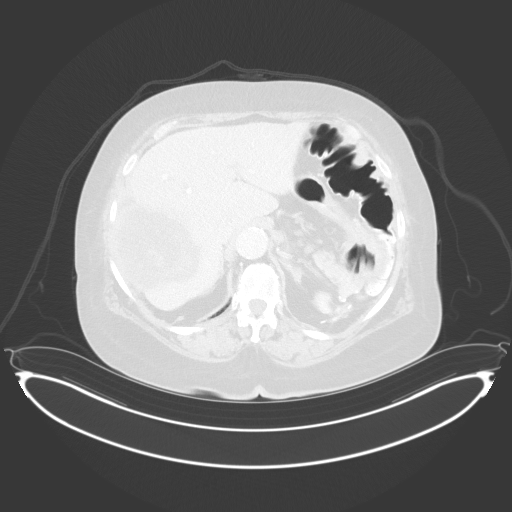
[im 90/129  lung]
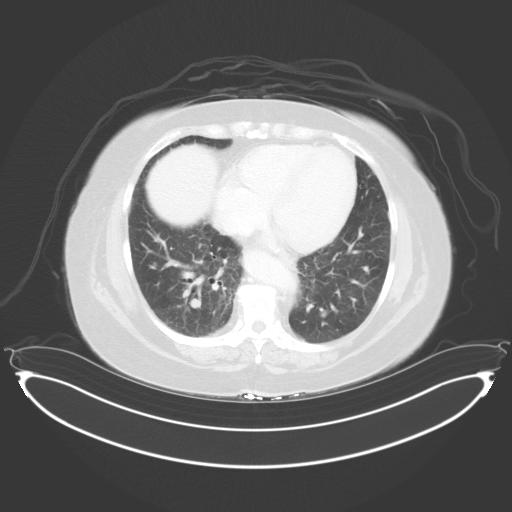
[im 103/129  lung]
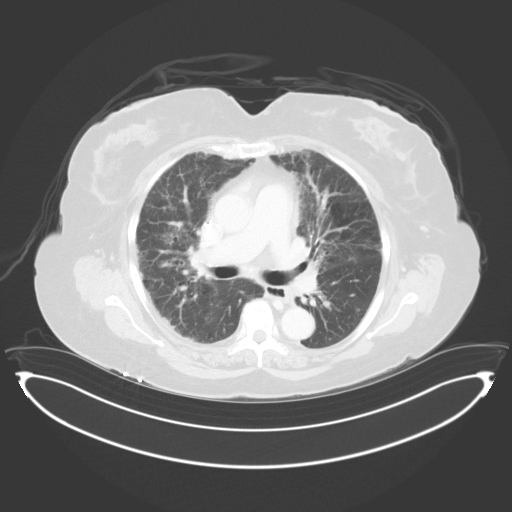
[im 116/129  mediastinal]
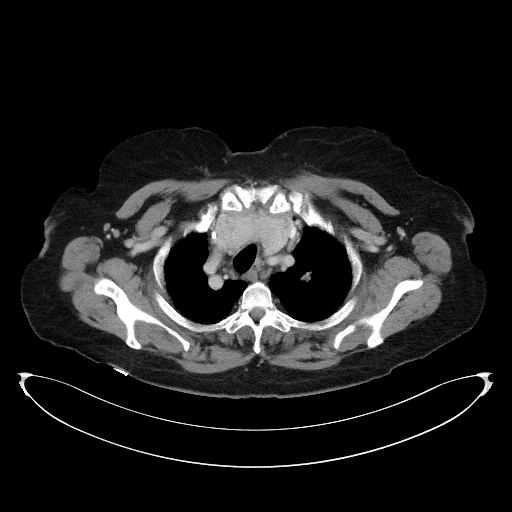
[im 116/129  lung]
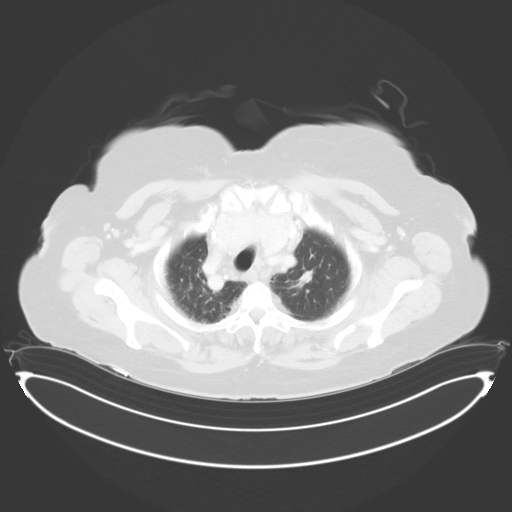

[Series 4: coronals · coronal · 0.92mm/px · 3 of 154 slices shown]
[im 31/154  lung]
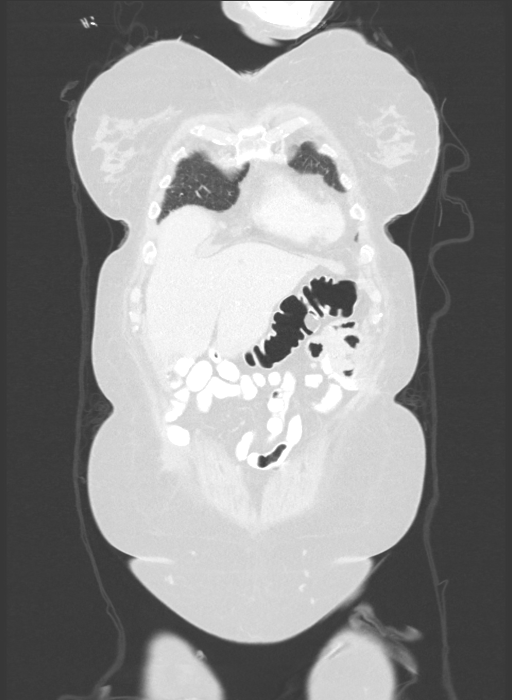
[im 62/154  lung]
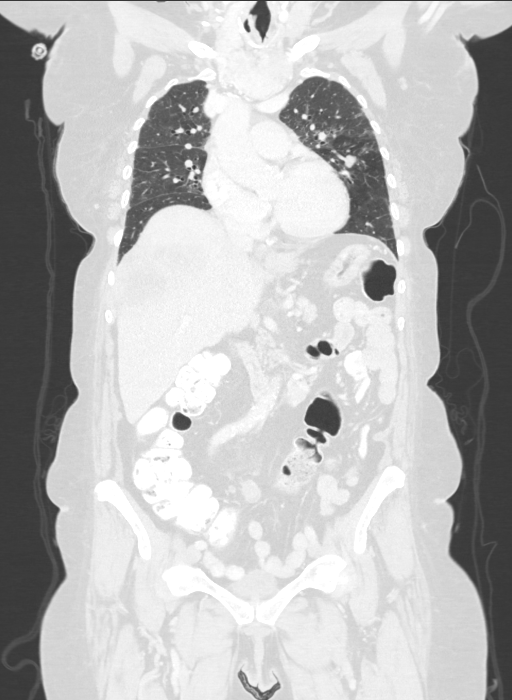
[im 92/154  lung]
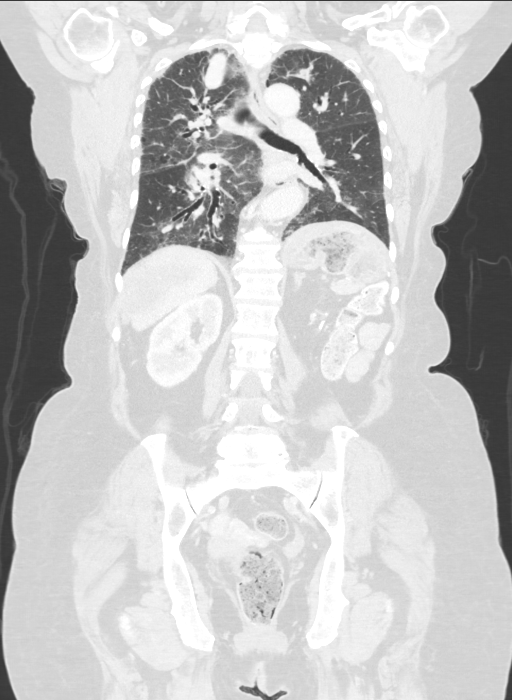

[12 of 36 positions shown; findings below may reference images not displayed]

FINDINGS: CT CHEST FINDINGS

Cardiovascular: Aortic atherosclerosis. Right chest port catheter.
Normal heart size. Three-vessel coronary artery calcifications. No
pericardial effusion.

Mediastinum/Nodes: Unchanged prominent mediastinal lymph nodes.
Unchanged, enlarged right epicardial lymph nodes, the largest
measuring 1.8 x 0.9 cm, previously FDG avid (series 2, image 45).
Thyroid goiter. Trachea, and esophagus demonstrate no significant
findings.

Lungs/Pleura: There are numerous new and enlarged pulmonary nodules,
an index nodule in the anterior right upper lobe measuring 1.4 x
cm previously 0.8 x 0.6 cm when measured similarly (series 6, image
56). New nodules include a 1.0 cm nodule of the right lower lobe
(series 6, image 98). Mild paraseptal emphysema. No pleural effusion
or pneumothorax.

Musculoskeletal: No chest wall mass or suspicious bone lesions
identified.

CT ABDOMEN PELVIS FINDINGS

Hepatobiliary: Although somewhat difficult to compare due to lack of
intravenous contrast on prior examination, there is no significant
change in a bulky, hypodense and likely centrally necrotic mass of
the right lobe of the liver, measuring approximately 8.3 x 7.5 cm
(series 2, image 51). No gallstones, gallbladder wall thickening, or
biliary dilatation.

Pancreas: Unremarkable. No pancreatic ductal dilatation or
surrounding inflammatory changes.

Spleen: Status post splenectomy.

Adrenals/Urinary Tract: Adrenal glands are unremarkable. Status post
left partial nephrectomy with a small residual of superior pole
renal parenchyma. Bladder is unremarkable.

Stomach/Bowel: Stomach is within normal limits. Appendix appears
normal. Status post sigmoid colon resection and reanastomosis.

Vascular/Lymphatic: Aortic atherosclerosis. Interval enlargement of
a nodule or lymph node in the sigmoid mesentery measuring 1.4 cm,
previously 0.9 cm and previously FDG avid (series 2, image 97).

Reproductive: No mass or other abnormality.

Other: No abdominal wall hernia or abnormality. Unchanged,
superficial soft tissue nodule over the right lower back, likely a
cutaneous inclusion cyst and not FDG avid on prior examination
(series 2, image 73). No abdominopelvic ascites.

Musculoskeletal: No acute or significant osseous findings.
IMPRESSION: 1. There are numerous new and enlarged pulmonary nodules, an index
nodule in the anterior right upper lobe measuring 1.4 x 1.0 cm
previously 0.8 x 0.6 cm when measured similarly (series 6, image
56). New nodules include a 1.0 cm nodule of the right lower lobe
(series 6, image 98). Findings are consistent with worsened
pulmonary metastatic disease.

2. Interval enlargement of a nodule or lymph node in the sigmoid
mesentery measuring 1.4 cm, previously 0.9 cm and previously FDG
avid (series 2, image 97), consistent with worsened metastatic
disease.

3. Although somewhat difficult to compare due to lack of intravenous
contrast on prior examination, there is no significant change in a
bulky, hypodense and likely centrally necrotic mass of the right
lobe of the liver, measuring approximately 8.3 x 7.5 cm (series 2,
image 51).

4. Unchanged, enlarged right epicardial lymph nodes, the largest
measuring 1.8 x 0.9 cm, previously FDG avid (series 2, image 45).

5. Redemonstrated postoperative findings including sigmoid colon
resection and reanastomosis, splenectomy, and left partial
nephrectomy.

6. Coronary artery disease. Aortic Atherosclerosis ([id number]).
Emphysema ([id number]).

## 2020-01-04 ENCOUNTER — Other Ambulatory Visit: Payer: Self-pay | Admitting: Hematology & Oncology

## 2020-01-04 DIAGNOSIS — C186 Malignant neoplasm of descending colon: Secondary | ICD-10-CM

## 2020-01-04 DIAGNOSIS — D500 Iron deficiency anemia secondary to blood loss (chronic): Secondary | ICD-10-CM

## 2020-01-09 ENCOUNTER — Inpatient Hospital Stay: Payer: Medicare Other

## 2020-01-09 ENCOUNTER — Encounter: Payer: Self-pay | Admitting: Family

## 2020-01-09 ENCOUNTER — Other Ambulatory Visit: Payer: Self-pay

## 2020-01-09 ENCOUNTER — Inpatient Hospital Stay: Payer: Medicare Other | Attending: Hematology & Oncology

## 2020-01-09 ENCOUNTER — Inpatient Hospital Stay (HOSPITAL_BASED_OUTPATIENT_CLINIC_OR_DEPARTMENT_OTHER): Payer: Medicare Other | Admitting: Family

## 2020-01-09 VITALS — BP 140/74 | HR 65 | Temp 98.6°F | Resp 18 | Ht 63.0 in | Wt 183.0 lb

## 2020-01-09 DIAGNOSIS — D500 Iron deficiency anemia secondary to blood loss (chronic): Secondary | ICD-10-CM

## 2020-01-09 DIAGNOSIS — C787 Secondary malignant neoplasm of liver and intrahepatic bile duct: Secondary | ICD-10-CM | POA: Insufficient documentation

## 2020-01-09 DIAGNOSIS — R5383 Other fatigue: Secondary | ICD-10-CM | POA: Diagnosis not present

## 2020-01-09 DIAGNOSIS — C186 Malignant neoplasm of descending colon: Secondary | ICD-10-CM | POA: Insufficient documentation

## 2020-01-09 DIAGNOSIS — C189 Malignant neoplasm of colon, unspecified: Secondary | ICD-10-CM

## 2020-01-09 DIAGNOSIS — Z79899 Other long term (current) drug therapy: Secondary | ICD-10-CM | POA: Diagnosis not present

## 2020-01-09 LAB — CBC WITH DIFFERENTIAL (CANCER CENTER ONLY)
Abs Immature Granulocytes: 0.17 10*3/uL — ABNORMAL HIGH (ref 0.00–0.07)
Basophils Absolute: 0.1 10*3/uL (ref 0.0–0.1)
Basophils Relative: 0 %
Eosinophils Absolute: 0 10*3/uL (ref 0.0–0.5)
Eosinophils Relative: 0 %
HCT: 28.3 % — ABNORMAL LOW (ref 36.0–46.0)
Hemoglobin: 9.3 g/dL — ABNORMAL LOW (ref 12.0–15.0)
Immature Granulocytes: 1 %
Lymphocytes Relative: 14 %
Lymphs Abs: 1.6 10*3/uL (ref 0.7–4.0)
MCH: 31.3 pg (ref 26.0–34.0)
MCHC: 32.9 g/dL (ref 30.0–36.0)
MCV: 95.3 fL (ref 80.0–100.0)
Monocytes Absolute: 1.3 10*3/uL — ABNORMAL HIGH (ref 0.1–1.0)
Monocytes Relative: 11 %
Neutro Abs: 8.6 10*3/uL — ABNORMAL HIGH (ref 1.7–7.7)
Neutrophils Relative %: 74 %
Platelet Count: 370 10*3/uL (ref 150–400)
RBC: 2.97 MIL/uL — ABNORMAL LOW (ref 3.87–5.11)
RDW: 20.1 % — ABNORMAL HIGH (ref 11.5–15.5)
WBC Count: 11.8 10*3/uL — ABNORMAL HIGH (ref 4.0–10.5)
nRBC: 0 % (ref 0.0–0.2)

## 2020-01-09 LAB — CMP (CANCER CENTER ONLY)
ALT: 5 U/L (ref 0–44)
AST: 17 U/L (ref 15–41)
Albumin: 3.5 g/dL (ref 3.5–5.0)
Alkaline Phosphatase: 148 U/L — ABNORMAL HIGH (ref 38–126)
Anion gap: 7 (ref 5–15)
BUN: 20 mg/dL (ref 8–23)
CO2: 25 mmol/L (ref 22–32)
Calcium: 9.8 mg/dL (ref 8.9–10.3)
Chloride: 101 mmol/L (ref 98–111)
Creatinine: 1.32 mg/dL — ABNORMAL HIGH (ref 0.44–1.00)
GFR, Est AFR Am: 43 mL/min — ABNORMAL LOW (ref >60)
GFR, Estimated: 37 mL/min — ABNORMAL LOW (ref >60)
Glucose, Bld: 96 mg/dL (ref 70–99)
Potassium: 4.1 mmol/L (ref 3.5–5.1)
Sodium: 133 mmol/L — ABNORMAL LOW (ref 135–145)
Total Bilirubin: 0.4 mg/dL (ref 0.3–1.2)
Total Protein: 7.3 g/dL (ref 6.5–8.1)

## 2020-01-09 NOTE — Progress Notes (Signed)
Hematology and Oncology Follow Up Visit  Melanie [name] [id number] Apr 30, 1935 85 y.o. 01/09/2020   Principle Diagnosis:  Metastatic colon cancer -- progressive mets -- NO actionable mutation/LOW TMB/ MSI stable/ KRAS (+)/HER2-  Past Therapy: FOLFOX q 14 days s/p cycle 3 - DC'd secondary to non-tolerance RFA surgery - January 2019 Xeloda 1500 mg po BID (14/7) -- s/p c#4- start on 11/20/2018 -- d/c on 02/28/2019 Avastin 7.5 mg/kg IV q 3 week -- S/p c#4 -started on 06//23/2020 -- d/c on 02/28/2019 Stivarga 80 mg po q day (14 on/7 off) -- start on 04/24/2019-- d/c on 07/10/2019 due to progression FOLFIRI -- started02/24/2021, s/p cycle #4 -- d/c on 10/23/2019  Current Therapy: Lonsurf 60 mg po bid (2 week on/ 2 weeks off) IV Iron as indicated    Interim History:  Ms. Melanie [name] is here today for follow-up. She is doing well but has had some mild fatigue at times. She takes a break to rest when needed.  CEA last month 211. Today's result is pending.  So far she is tolerating Lonsurf well and is currently in her third cycle.  Hgb is stable at 9.3, MCV 95, WBC count 11.8 and platelets 370. She has not noted any blood loss. No bruising or petechiae.  No falls or syncopal episodes to report.  No swelling, tenderness, numbness or tingling in her extremities. Pedal pulses are 2+.  No fever, chills, n/v, cough, rash, dizziness, SOB, oral sores, chest pain, palpitations, abdominal pain or changes in bowel or bladder habits.  She states that she has a good appetite and grazes throughout the day. She feels that she is staying well hydrated. Her weight is stable.   ECOG Performance Status: 1 - Symptomatic but completely ambulatory  Medications:  Allergies as of 01/09/2020   No Known Allergies     Medication List       Accurate as of January 09, 2020 10:26 AM. If you have any questions, ask your nurse or doctor.        amlodipine-atorvastatin 10-10 MG tablet Commonly  known as: CADUET Take 1 tablet by mouth daily.   aspirin 81 MG chewable tablet Chew by mouth daily.   CALTRATE 600+D PO Take 1 tablet by mouth at bedtime.   levothyroxine 75 MCG tablet Commonly known as: SYNTHROID TAKE 1 TABLET (75 MCG TOTAL) BY MOUTH DAILY BEFORE BREAKFAST.   lidocaine-prilocaine cream Commonly known as: EMLA APPLY TO AFFECTED AREA AS DIRECTED AS NEEDED   Lonsurf 20-8.19 MG tablet Generic drug: trifluridine-tipiracil Take 3 tablets (60 mg of trifluridine total) by mouth 2 (two) times daily after a meal. Take 1 hr after AM & PM meals on days 1-5, 8-12. Repeat every 28day   meloxicam 7.5 MG tablet Commonly known as: Mobic Take 1 tablet (7.5 mg total) by mouth daily. You MUST take with food!!   metoprolol tartrate 25 MG tablet Commonly known as: LOPRESSOR Take 50 mg by mouth 2 (two) times daily.   multivitamin with minerals Tabs tablet Take 1 tablet by mouth daily.   naproxen 500 MG EC tablet Commonly known as: EC NAPROSYN Take 500 mg by mouth 2 (two) times daily with a meal.   ondansetron 4 MG tablet Commonly known as: Zofran Take 1 tablet (4 mg total) by mouth every 8 (eight) hours as needed for nausea or vomiting.   potassium chloride SA 20 MEQ tablet Commonly known as: KLOR-CON Take 1 tablet (20 mEq total) by mouth daily.   VITAMIN B-12 PO Take  1 tablet by mouth daily.       Allergies: No Known Allergies  Past Medical History, Surgical history, Social history, and Family History were reviewed and updated.  Review of Systems: All other 10 point review of systems is negative.   Physical Exam:  vitals were not taken for this visit.   Wt Readings from Last 3 Encounters:  12/05/19 183 lb 6.4 oz (83.2 kg)  10/23/19 193 lb (87.5 kg)  09/25/19 191 lb 1.3 oz (86.7 kg)    Ocular: Sclerae unicteric, pupils equal, round and reactive to light Ear-nose-throat: Oropharynx clear, dentition fair Lymphatic: No cervical or supraclavicular  adenopathy Lungs no rales or rhonchi, good excursion bilaterally Heart regular rate and rhythm, no murmur appreciated Abd soft, nontender, positive bowel sounds, no liver or spleen tip palpated on exam, no fluid wave MSK no focal spinal tenderness, no joint edema Neuro: non-focal, well-oriented, appropriate affect Breasts: Deferred   Lab Results  Component Value Date   WBC 5.2 12/05/2019   HGB 9.3 (L) 12/05/2019   HCT 28.5 (L) 12/05/2019   MCV 94.4 12/05/2019   PLT 339 12/05/2019   Lab Results  Component Value Date   FERRITIN 872 (H) 10/23/2019   IRON 34 (L) 10/23/2019   TIBC 220 (L) 10/23/2019   UIBC 187 10/23/2019   IRONPCTSAT 15 (L) 10/23/2019   Lab Results  Component Value Date   RETICCTPCT 1.4 09/04/2018   RBC 3.02 (L) 12/05/2019   No results found for: KPAFRELGTCHN, LAMBDASER, KAPLAMBRATIO No results found for: IGGSERUM, IGA, IGMSERUM No results found for: Kathrynn Ducking, MSPIKE, SPEI   Chemistry      Component Value Date/Time   NA 136 12/05/2019 1040   NA 144 04/26/2017 0830   NA 137 09/30/2016 0940   K 4.1 12/05/2019 1040   K 4.1 04/26/2017 0830   K 4.7 09/30/2016 0940   CL 107 12/05/2019 1040   CL 108 04/26/2017 0830   CO2 23 12/05/2019 1040   CO2 27 04/26/2017 0830   CO2 18 (L) 09/30/2016 0940   BUN 16 12/05/2019 1040   BUN 25 (H) 04/26/2017 0830   BUN 21.5 09/30/2016 0940   CREATININE 1.27 (H) 12/05/2019 1040   CREATININE 1.5 (H) 04/26/2017 0830   CREATININE 1.8 (H) 09/30/2016 0940      Component Value Date/Time   CALCIUM 9.9 12/05/2019 1040   CALCIUM 9.9 04/26/2017 0830   CALCIUM 10.4 09/30/2016 0940   ALKPHOS 125 12/05/2019 1040   ALKPHOS 124 (H) 04/26/2017 0830   ALKPHOS 147 09/30/2016 0940   AST 15 12/05/2019 1040   AST 27 09/30/2016 0940   ALT 2 12/05/2019 1040   ALT 16 04/26/2017 0830   ALT 11 09/30/2016 0940   BILITOT 0.4 12/05/2019 1040   BILITOT 0.34 09/30/2016 0940       Impression  and Plan: [name] is a very pleasant 85 yo African American female with adenocarcinoma of the descending colon that has recurred and is now metastatic. She is doing well on Lonsurf so far and has no complaints at this time. She will continue her same regimen. CEA was drawn today and result is pending.  We will see what her iron studies show and replace again if needed.  We will see her again in another month.  She will contact our office with any questions or concerns. We can certainly see her sooner if needed.   Laverna Peace, NP 8/12/202110:26 AM

## 2020-01-09 NOTE — Patient Instructions (Signed)

## 2020-01-10 LAB — FERRITIN: Ferritin: 960 ng/mL — ABNORMAL HIGH (ref 11–307)

## 2020-01-10 LAB — IRON AND TIBC
Iron: 106 ug/dL (ref 41–142)
Saturation Ratios: 65 % — ABNORMAL HIGH (ref 21–57)
TIBC: 163 ug/dL — ABNORMAL LOW (ref 236–444)
UIBC: 57 ug/dL — ABNORMAL LOW (ref 120–384)

## 2020-01-10 LAB — CEA (IN HOUSE-CHCC): CEA (CHCC-In House): 295.69 ng/mL — ABNORMAL HIGH (ref 0.00–5.00)

## 2020-02-10 ENCOUNTER — Other Ambulatory Visit: Payer: Self-pay | Admitting: *Deleted

## 2020-02-10 ENCOUNTER — Inpatient Hospital Stay: Payer: Medicare Other | Attending: Hematology & Oncology

## 2020-02-10 ENCOUNTER — Other Ambulatory Visit: Payer: Self-pay

## 2020-02-10 ENCOUNTER — Other Ambulatory Visit: Payer: Self-pay | Admitting: Hematology & Oncology

## 2020-02-10 ENCOUNTER — Inpatient Hospital Stay (HOSPITAL_BASED_OUTPATIENT_CLINIC_OR_DEPARTMENT_OTHER): Payer: Medicare Other | Admitting: Family

## 2020-02-10 ENCOUNTER — Inpatient Hospital Stay: Payer: Medicare Other

## 2020-02-10 ENCOUNTER — Encounter: Payer: Self-pay | Admitting: Family

## 2020-02-10 VITALS — BP 138/65 | HR 85 | Temp 98.1°F | Resp 18 | Ht 63.0 in | Wt 181.0 lb

## 2020-02-10 DIAGNOSIS — R531 Weakness: Secondary | ICD-10-CM | POA: Insufficient documentation

## 2020-02-10 DIAGNOSIS — R0602 Shortness of breath: Secondary | ICD-10-CM | POA: Diagnosis not present

## 2020-02-10 DIAGNOSIS — C787 Secondary malignant neoplasm of liver and intrahepatic bile duct: Secondary | ICD-10-CM

## 2020-02-10 DIAGNOSIS — C189 Malignant neoplasm of colon, unspecified: Secondary | ICD-10-CM

## 2020-02-10 DIAGNOSIS — Z79899 Other long term (current) drug therapy: Secondary | ICD-10-CM | POA: Diagnosis not present

## 2020-02-10 DIAGNOSIS — C186 Malignant neoplasm of descending colon: Secondary | ICD-10-CM | POA: Diagnosis not present

## 2020-02-10 DIAGNOSIS — D500 Iron deficiency anemia secondary to blood loss (chronic): Secondary | ICD-10-CM

## 2020-02-10 DIAGNOSIS — D649 Anemia, unspecified: Secondary | ICD-10-CM

## 2020-02-10 DIAGNOSIS — R5383 Other fatigue: Secondary | ICD-10-CM | POA: Diagnosis not present

## 2020-02-10 LAB — CBC WITH DIFFERENTIAL (CANCER CENTER ONLY)
Abs Immature Granulocytes: 0.23 10*3/uL — ABNORMAL HIGH (ref 0.00–0.07)
Basophils Absolute: 0.1 10*3/uL (ref 0.0–0.1)
Basophils Relative: 0 %
Eosinophils Absolute: 0 10*3/uL (ref 0.0–0.5)
Eosinophils Relative: 0 %
HCT: 22.7 % — ABNORMAL LOW (ref 36.0–46.0)
Hemoglobin: 7.8 g/dL — ABNORMAL LOW (ref 12.0–15.0)
Immature Granulocytes: 2 %
Lymphocytes Relative: 8 %
Lymphs Abs: 1.1 10*3/uL (ref 0.7–4.0)
MCH: 33.2 pg (ref 26.0–34.0)
MCHC: 34.4 g/dL (ref 30.0–36.0)
MCV: 96.6 fL (ref 80.0–100.0)
Monocytes Absolute: 0.8 10*3/uL (ref 0.1–1.0)
Monocytes Relative: 6 %
Neutro Abs: 12.1 10*3/uL — ABNORMAL HIGH (ref 1.7–7.7)
Neutrophils Relative %: 84 %
Platelet Count: 368 10*3/uL (ref 150–400)
RBC: 2.35 MIL/uL — ABNORMAL LOW (ref 3.87–5.11)
RDW: 21 % — ABNORMAL HIGH (ref 11.5–15.5)
WBC Count: 14.4 10*3/uL — ABNORMAL HIGH (ref 4.0–10.5)
nRBC: 0.4 % — ABNORMAL HIGH (ref 0.0–0.2)

## 2020-02-10 LAB — CMP (CANCER CENTER ONLY)
ALT: 4 U/L (ref 0–44)
AST: 19 U/L (ref 15–41)
Albumin: 3.2 g/dL — ABNORMAL LOW (ref 3.5–5.0)
Alkaline Phosphatase: 126 U/L (ref 38–126)
Anion gap: 7 (ref 5–15)
BUN: 18 mg/dL (ref 8–23)
CO2: 24 mmol/L (ref 22–32)
Calcium: 9.5 mg/dL (ref 8.9–10.3)
Chloride: 104 mmol/L (ref 98–111)
Creatinine: 1.25 mg/dL — ABNORMAL HIGH (ref 0.44–1.00)
GFR, Est AFR Am: 46 mL/min — ABNORMAL LOW (ref >60)
GFR, Estimated: 40 mL/min — ABNORMAL LOW (ref >60)
Glucose, Bld: 100 mg/dL — ABNORMAL HIGH (ref 70–99)
Potassium: 4.2 mmol/L (ref 3.5–5.1)
Sodium: 135 mmol/L (ref 135–145)
Total Bilirubin: 0.5 mg/dL (ref 0.3–1.2)
Total Protein: 7.1 g/dL (ref 6.5–8.1)

## 2020-02-10 NOTE — Progress Notes (Signed)
Hematology and Oncology Follow Up Visit  Melanie [name] [id number] 1934-12-21 85 y.o. 02/10/2020   Principle Diagnosis:  Metastatic colon cancer -- progressive mets -- NO actionable mutation/LOW TMB/ MSI stable/ KRAS (+)/HER2-  Past Therapy: FOLFOX q 14 days s/p cycle 3 - DC'd secondary to non-tolerance RFA surgery - January 2019 Xeloda 1500 mg po BID (14/7) -- s/p c#4- start on 11/20/2018 -- d/c on 02/28/2019 Avastin 7.5 mg/kg IV q 3 week -- S/p c#4 -started on 06//23/2020 -- d/c on 02/28/2019 Stivarga 80 mg po q day (14 on/7 off) -- start on 04/24/2019-- d/c on 07/10/2019 due to progression FOLFIRI -- started02/24/2021, s/p cycle #4 -- d/c on 10/23/2019  Current Therapy: Lonsurf 60 mg po bid (2 week on/ 2 weeks off) IV Iron as indicated    Interim History:  Melanie [name] is here today for follow-up. She is feeling fatigued and weak at times. She states that she is resting well at night and takes an afternoon nap each day.  She states that she is taking the Lonsurf as prescribed and has had no issues.  CEA last month was 295 No fever, chills, n/v, cough, rash, dizziness, chest pain palpitations, abdominal pain or changes in bowel or bladder habits.  No episodes of bleeding. No bruising or petechiae.  She has occasional SOB with over exertion. She enjoys getting outside and doing yard work.  No swelling, tenderness, numbness or tingling in her extremities.  No falls or syncope.  She has maintained a good appetite and is staying well hydrated. Her weight is stable.   ECOG Performance Status: 1 - Symptomatic but completely ambulatory  Medications:  Allergies as of 02/10/2020   No Known Allergies     Medication List       Accurate as of February 10, 2020 11:22 AM. If you have any questions, ask your nurse or doctor.        amlodipine-atorvastatin 10-10 MG tablet Commonly known as: CADUET Take 1 tablet by mouth daily.   aspirin 81 MG chewable  tablet Chew by mouth daily.   CALTRATE 600+D PO Take 1 tablet by mouth at bedtime.   levothyroxine 75 MCG tablet Commonly known as: SYNTHROID TAKE 1 TABLET (75 MCG TOTAL) BY MOUTH DAILY BEFORE BREAKFAST.   lidocaine-prilocaine cream Commonly known as: EMLA APPLY TO AFFECTED AREA AS DIRECTED AS NEEDED   Lonsurf 20-8.19 MG tablet Generic drug: trifluridine-tipiracil Take 3 tablets (60 mg of trifluridine total) by mouth 2 (two) times daily after a meal. Take 1 hr after AM & PM meals on days 1-5, 8-12. Repeat every 28day   meloxicam 7.5 MG tablet Commonly known as: Mobic Take 1 tablet (7.5 mg total) by mouth daily. You MUST take with food!!   metoprolol tartrate 25 MG tablet Commonly known as: LOPRESSOR Take 50 mg by mouth 2 (two) times daily.   multivitamin with minerals Tabs tablet Take 1 tablet by mouth daily.   naproxen 500 MG EC tablet Commonly known as: EC NAPROSYN Take 500 mg by mouth 2 (two) times daily with a meal.   ondansetron 4 MG tablet Commonly known as: Zofran Take 1 tablet (4 mg total) by mouth every 8 (eight) hours as needed for nausea or vomiting.   potassium chloride SA 20 MEQ tablet Commonly known as: KLOR-CON Take 1 tablet (20 mEq total) by mouth daily.   VITAMIN B-12 PO Take 1 tablet by mouth daily.       Allergies: No Known Allergies  Past Medical History,  Surgical history, Social history, and Family History were reviewed and updated.  Review of Systems: All other 10 point review of systems is negative.   Physical Exam:  vitals were not taken for this visit.   Wt Readings from Last 3 Encounters:  02/10/20 181 lb (82.1 kg)  01/09/20 183 lb (83 kg)  12/05/19 183 lb 6.4 oz (83.2 kg)    Ocular: Sclerae unicteric, pupils equal, round and reactive to light Ear-nose-throat: Oropharynx clear, dentition fair Lymphatic: No cervical or supraclavicular adenopathy Lungs no rales or rhonchi, good excursion bilaterally Heart regular rate and  rhythm, no murmur appreciated Abd soft, nontender, positive bowel sounds, no liver or spleen tip palpated on exam, no fluid wave  MSK no focal spinal tenderness, no joint edema Neuro: non-focal, well-oriented, appropriate affect Breasts: Deferred   Lab Results  Component Value Date   WBC 11.8 (H) 01/09/2020   HGB 9.3 (L) 01/09/2020   HCT 28.3 (L) 01/09/2020   MCV 95.3 01/09/2020   PLT 370 01/09/2020   Lab Results  Component Value Date   FERRITIN 960 (H) 01/09/2020   IRON 106 01/09/2020   TIBC 163 (L) 01/09/2020   UIBC 57 (L) 01/09/2020   IRONPCTSAT 65 (H) 01/09/2020   Lab Results  Component Value Date   RETICCTPCT 1.4 09/04/2018   RBC 2.97 (L) 01/09/2020   No results found for: KPAFRELGTCHN, LAMBDASER, KAPLAMBRATIO No results found for: IGGSERUM, IGA, IGMSERUM No results found for: Kathrynn Ducking, MSPIKE, SPEI   Chemistry      Component Value Date/Time   NA 133 (L) 01/09/2020 1025   NA 144 04/26/2017 0830   NA 137 09/30/2016 0940   K 4.1 01/09/2020 1025   K 4.1 04/26/2017 0830   K 4.7 09/30/2016 0940   CL 101 01/09/2020 1025   CL 108 04/26/2017 0830   CO2 25 01/09/2020 1025   CO2 27 04/26/2017 0830   CO2 18 (L) 09/30/2016 0940   BUN 20 01/09/2020 1025   BUN 25 (H) 04/26/2017 0830   BUN 21.5 09/30/2016 0940   CREATININE 1.32 (H) 01/09/2020 1025   CREATININE 1.5 (H) 04/26/2017 0830   CREATININE 1.8 (H) 09/30/2016 0940      Component Value Date/Time   CALCIUM 9.8 01/09/2020 1025   CALCIUM 9.9 04/26/2017 0830   CALCIUM 10.4 09/30/2016 0940   ALKPHOS 148 (H) 01/09/2020 1025   ALKPHOS 124 (H) 04/26/2017 0830   ALKPHOS 147 09/30/2016 0940   AST 17 01/09/2020 1025   AST 27 09/30/2016 0940   ALT 5 01/09/2020 1025   ALT 16 04/26/2017 0830   ALT 11 09/30/2016 0940   BILITOT 0.4 01/09/2020 1025   BILITOT 0.34 09/30/2016 0940       Impression and Plan: Melanie [name] is a very pleasant 85 yo African American female with  adenocarcinoma of the descending colon that has recurred and is now metastatic. We will bring her back in tomorrow for 2 units of blood.  I spoke with Dr. Marin Olp and we will have her hold her Lonsurf this week and then she will have a 2 week break before restarting at a decreased dose of 40 mg PO BID.  She verbalized understanding of the medication change. I also wrote directions out for her to take home.  Follow up in 1 month She can contact our office with any questions or concerns. We can certainly see her sooner if needed.   Laverna Peace, NP 9/13/202111:22 AM

## 2020-02-11 ENCOUNTER — Other Ambulatory Visit: Payer: Self-pay | Admitting: Hematology & Oncology

## 2020-02-11 ENCOUNTER — Inpatient Hospital Stay: Payer: Medicare Other

## 2020-02-11 ENCOUNTER — Telehealth: Payer: Self-pay | Admitting: Family

## 2020-02-11 DIAGNOSIS — C186 Malignant neoplasm of descending colon: Secondary | ICD-10-CM | POA: Diagnosis not present

## 2020-02-11 DIAGNOSIS — C787 Secondary malignant neoplasm of liver and intrahepatic bile duct: Secondary | ICD-10-CM

## 2020-02-11 DIAGNOSIS — C189 Malignant neoplasm of colon, unspecified: Secondary | ICD-10-CM

## 2020-02-11 DIAGNOSIS — D649 Anemia, unspecified: Secondary | ICD-10-CM

## 2020-02-11 LAB — IRON AND TIBC
Iron: 82 ug/dL (ref 41–142)
Saturation Ratios: 54 % (ref 21–57)
TIBC: 153 ug/dL — ABNORMAL LOW (ref 236–444)
UIBC: 70 ug/dL — ABNORMAL LOW (ref 120–384)

## 2020-02-11 LAB — PREPARE RBC (CROSSMATCH)

## 2020-02-11 LAB — CEA (IN HOUSE-CHCC): CEA (CHCC-In House): 285.45 ng/mL — ABNORMAL HIGH (ref 0.00–5.00)

## 2020-02-11 LAB — FERRITIN: Ferritin: 1086 ng/mL — ABNORMAL HIGH (ref 11–307)

## 2020-02-11 MED ORDER — ACETAMINOPHEN 325 MG PO TABS
650.0000 mg | ORAL_TABLET | Freq: Once | ORAL | Status: AC
  Administered 2020-02-11: 650 mg via ORAL

## 2020-02-11 MED ORDER — LONSURF 20-8.19 MG PO TABS: 40.0000 mg | ORAL_TABLET | Freq: Two times a day (BID) | ORAL | 4 refills | Status: DC

## 2020-02-11 MED ORDER — DIPHENHYDRAMINE HCL 25 MG PO CAPS
25.0000 mg | ORAL_CAPSULE | Freq: Once | ORAL | Status: AC
  Administered 2020-02-11: 25 mg via ORAL

## 2020-02-11 MED ORDER — ACETAMINOPHEN 325 MG PO TABS
ORAL_TABLET | ORAL | Status: AC
  Filled 2020-02-11: qty 2

## 2020-02-11 MED ORDER — HEPARIN SOD (PORK) LOCK FLUSH 100 UNIT/ML IV SOLN
500.0000 [IU] | Freq: Once | INTRAVENOUS | Status: AC
  Administered 2020-02-11: 500 [IU] via INTRAVENOUS
  Filled 2020-02-11: qty 5

## 2020-02-11 MED ORDER — SODIUM CHLORIDE 0.9% FLUSH
10.0000 mL | Freq: Once | INTRAVENOUS | Status: AC
  Administered 2020-02-11: 10 mL via INTRAVENOUS
  Filled 2020-02-11: qty 10

## 2020-02-11 MED ORDER — DIPHENHYDRAMINE HCL 25 MG PO CAPS
ORAL_CAPSULE | ORAL | Status: AC
  Filled 2020-02-11: qty 1

## 2020-02-11 MED ORDER — FUROSEMIDE 10 MG/ML IJ SOLN: 20.0000 mg | Freq: Once | INTRAMUSCULAR | Status: DC

## 2020-02-11 NOTE — Telephone Encounter (Signed)
No los 9/13

## 2020-02-11 NOTE — Patient Instructions (Signed)

## 2020-02-12 ENCOUNTER — Other Ambulatory Visit: Payer: Self-pay | Admitting: *Deleted

## 2020-02-12 ENCOUNTER — Ambulatory Visit: Payer: Medicare Other

## 2020-02-12 DIAGNOSIS — C787 Secondary malignant neoplasm of liver and intrahepatic bile duct: Secondary | ICD-10-CM

## 2020-02-12 DIAGNOSIS — C189 Malignant neoplasm of colon, unspecified: Secondary | ICD-10-CM

## 2020-02-12 LAB — TYPE AND SCREEN
ABO/RH(D): O POS
Antibody Screen: NEGATIVE
Unit division: 0
Unit division: 0

## 2020-02-12 LAB — BPAM RBC
Blood Product Expiration Date: 202110042359
Blood Product Expiration Date: 202110062359
ISSUE DATE / TIME: 202109140742
ISSUE DATE / TIME: 202109140742
Unit Type and Rh: 5100
Unit Type and Rh: 5100

## 2020-02-12 MED ORDER — LONSURF 20-8.19 MG PO TABS: 40.0000 mg | ORAL_TABLET | Freq: Two times a day (BID) | ORAL | 4 refills | Status: DC

## 2020-02-26 ENCOUNTER — Encounter: Payer: Self-pay | Admitting: Hematology & Oncology

## 2020-03-04 ENCOUNTER — Other Ambulatory Visit: Payer: Self-pay | Admitting: Hematology & Oncology

## 2020-03-04 DIAGNOSIS — C186 Malignant neoplasm of descending colon: Secondary | ICD-10-CM

## 2020-03-04 DIAGNOSIS — D500 Iron deficiency anemia secondary to blood loss (chronic): Secondary | ICD-10-CM

## 2020-03-05 ENCOUNTER — Telehealth: Payer: Self-pay | Admitting: Hematology & Oncology

## 2020-03-05 NOTE — Telephone Encounter (Signed)
Called LM for patient with updated appt info from 9/13 late LOS entered 9/14. My Chart Message sent as well.

## 2020-03-11 ENCOUNTER — Inpatient Hospital Stay (HOSPITAL_BASED_OUTPATIENT_CLINIC_OR_DEPARTMENT_OTHER): Payer: Medicare Other | Admitting: Family

## 2020-03-11 ENCOUNTER — Other Ambulatory Visit: Payer: Self-pay | Admitting: *Deleted

## 2020-03-11 ENCOUNTER — Inpatient Hospital Stay: Payer: Medicare Other | Attending: Hematology & Oncology

## 2020-03-11 ENCOUNTER — Encounter: Payer: Self-pay | Admitting: Family

## 2020-03-11 ENCOUNTER — Other Ambulatory Visit: Payer: Self-pay

## 2020-03-11 ENCOUNTER — Inpatient Hospital Stay: Payer: Medicare Other

## 2020-03-11 VITALS — BP 123/60 | HR 82 | Temp 99.0°F | Resp 17 | Ht 63.0 in | Wt 178.1 lb

## 2020-03-11 DIAGNOSIS — Z79899 Other long term (current) drug therapy: Secondary | ICD-10-CM | POA: Insufficient documentation

## 2020-03-11 DIAGNOSIS — R399 Unspecified symptoms and signs involving the genitourinary system: Secondary | ICD-10-CM

## 2020-03-11 DIAGNOSIS — C787 Secondary malignant neoplasm of liver and intrahepatic bile duct: Secondary | ICD-10-CM | POA: Diagnosis present

## 2020-03-11 DIAGNOSIS — C186 Malignant neoplasm of descending colon: Secondary | ICD-10-CM | POA: Insufficient documentation

## 2020-03-11 DIAGNOSIS — D500 Iron deficiency anemia secondary to blood loss (chronic): Secondary | ICD-10-CM | POA: Diagnosis not present

## 2020-03-11 DIAGNOSIS — D649 Anemia, unspecified: Secondary | ICD-10-CM | POA: Diagnosis not present

## 2020-03-11 DIAGNOSIS — R5383 Other fatigue: Secondary | ICD-10-CM | POA: Diagnosis not present

## 2020-03-11 DIAGNOSIS — C189 Malignant neoplasm of colon, unspecified: Secondary | ICD-10-CM | POA: Diagnosis not present

## 2020-03-11 DIAGNOSIS — Z23 Encounter for immunization: Secondary | ICD-10-CM | POA: Insufficient documentation

## 2020-03-11 DIAGNOSIS — Z95828 Presence of other vascular implants and grafts: Secondary | ICD-10-CM

## 2020-03-11 DIAGNOSIS — R0602 Shortness of breath: Secondary | ICD-10-CM | POA: Insufficient documentation

## 2020-03-11 LAB — CBC WITH DIFFERENTIAL (CANCER CENTER ONLY)
Abs Immature Granulocytes: 0.34 10*3/uL — ABNORMAL HIGH (ref 0.00–0.07)
Basophils Absolute: 0.1 10*3/uL (ref 0.0–0.1)
Basophils Relative: 0 %
Eosinophils Absolute: 0.1 10*3/uL (ref 0.0–0.5)
Eosinophils Relative: 1 %
HCT: 24.4 % — ABNORMAL LOW (ref 36.0–46.0)
Hemoglobin: 8.2 g/dL — ABNORMAL LOW (ref 12.0–15.0)
Immature Granulocytes: 2 %
Lymphocytes Relative: 5 %
Lymphs Abs: 1 10*3/uL (ref 0.7–4.0)
MCH: 32.3 pg (ref 26.0–34.0)
MCHC: 33.6 g/dL (ref 30.0–36.0)
MCV: 96.1 fL (ref 80.0–100.0)
Monocytes Absolute: 1.1 10*3/uL — ABNORMAL HIGH (ref 0.1–1.0)
Monocytes Relative: 6 %
Neutro Abs: 17.4 10*3/uL — ABNORMAL HIGH (ref 1.7–7.7)
Neutrophils Relative %: 86 %
Platelet Count: 393 10*3/uL (ref 150–400)
RBC: 2.54 MIL/uL — ABNORMAL LOW (ref 3.87–5.11)
RDW: 18.1 % — ABNORMAL HIGH (ref 11.5–15.5)
WBC Count: 20 10*3/uL — ABNORMAL HIGH (ref 4.0–10.5)
nRBC: 0.9 % — ABNORMAL HIGH (ref 0.0–0.2)

## 2020-03-11 LAB — CMP (CANCER CENTER ONLY)
ALT: 5 U/L (ref 0–44)
AST: 21 U/L (ref 15–41)
Albumin: 3.1 g/dL — ABNORMAL LOW (ref 3.5–5.0)
Alkaline Phosphatase: 146 U/L — ABNORMAL HIGH (ref 38–126)
Anion gap: 7 (ref 5–15)
BUN: 17 mg/dL (ref 8–23)
CO2: 23 mmol/L (ref 22–32)
Calcium: 9.8 mg/dL (ref 8.9–10.3)
Chloride: 103 mmol/L (ref 98–111)
Creatinine: 1.11 mg/dL — ABNORMAL HIGH (ref 0.44–1.00)
GFR, Estimated: 46 mL/min — ABNORMAL LOW (ref >60)
Glucose, Bld: 113 mg/dL — ABNORMAL HIGH (ref 70–99)
Potassium: 3.8 mmol/L (ref 3.5–5.1)
Sodium: 133 mmol/L — ABNORMAL LOW (ref 135–145)
Total Bilirubin: 0.5 mg/dL (ref 0.3–1.2)
Total Protein: 6.9 g/dL (ref 6.5–8.1)

## 2020-03-11 LAB — RETICULOCYTES
Immature Retic Fract: 19 % — ABNORMAL HIGH (ref 2.3–15.9)
RBC.: 2.52 MIL/uL — ABNORMAL LOW (ref 3.87–5.11)
Retic Count, Absolute: 35 10*3/uL (ref 19.0–186.0)
Retic Ct Pct: 1.4 % (ref 0.4–3.1)

## 2020-03-11 LAB — SAMPLE TO BLOOD BANK

## 2020-03-11 LAB — PREPARE RBC (CROSSMATCH)

## 2020-03-11 MED ORDER — HEPARIN SOD (PORK) LOCK FLUSH 100 UNIT/ML IV SOLN
500.0000 [IU] | Freq: Once | INTRAVENOUS | Status: AC
  Administered 2020-03-11: 500 [IU] via INTRAVENOUS
  Filled 2020-03-11: qty 5

## 2020-03-11 MED ORDER — SODIUM CHLORIDE 0.9% FLUSH
10.0000 mL | Freq: Once | INTRAVENOUS | Status: AC
  Administered 2020-03-11: 10 mL via INTRAVENOUS
  Filled 2020-03-11: qty 10

## 2020-03-11 NOTE — Progress Notes (Signed)
Pt discharged in no apparent distress. Pt left ambulatory without assistance to MD   Pt aware of discharge instructions and verbalized understanding and had no further questions.

## 2020-03-11 NOTE — Progress Notes (Signed)
Hematology and Oncology Follow Up Visit  Melanie [name] [id number] 24-Feb-1935 85 y.o. 03/11/2020   Principle Diagnosis:  Metastatic colon cancer -- progressive mets -- NO actionable mutation/LOW TMB/ MSI stable/ KRAS (+)/HER2-  Past Therapy: FOLFOX q 14 days s/p cycle 3 - DC'd secondary to non-tolerance RFA surgery - January 2019 Xeloda 1500 mg po BID (14/7) -- s/p c#4- start on 11/20/2018 -- d/c on 02/28/2019 Avastin 7.5 mg/kg IV q 3 week -- S/p c#4 -started on 06//23/2020 -- d/c on 02/28/2019 Stivarga 80 mg po q day (14 on/7 off) -- start on 04/24/2019-- d/c on 07/10/2019 due to progression FOLFIRI -- started02/24/2021, s/p cycle #4 -- d/c on 10/23/2019  Current Therapy: Lonsurf 60 mg po bid (2 week on/ 2 weeks off) IV Ironas indicated   Interim History:  Melanie [name] is here today for follow-up. She is feeling fatigued at time and SOB with over exertion.  Dsepite this she has been walking for exercise and enjoying working out in her yard.  She was able to restart her Lonsurf last week and is currently in week two.  Hgb is 8.2, MCV 96.1, WBC count 20 and platelets 393.  She denies fever, chills, n/v, cough, rash, dizziness, chest pain, palpitations, abdominal pain or changes in bowel or bladder habits.  She has not noted any episodes of bleeding. No abnormal bruising or petechiae.  She denies feeling anxious or depressed.  No swelling, tenderness, numbness or tingling in her extremities.  No falls or syncope.  She states that she does not have much of an appetite but is staying well hydrated. Her weight is down 3 lbs at 178.   ECOG Performance Status: 1 - Symptomatic but completely ambulatory  Medications:  Allergies as of 03/11/2020   No Known Allergies     Medication List       Accurate as of March 11, 2020  3:28 PM. If you have any questions, ask your nurse or doctor.        amlodipine-atorvastatin 10-10 MG tablet Commonly known as:  CADUET Take 1 tablet by mouth daily.   aspirin 81 MG chewable tablet Chew by mouth daily.   CALTRATE 600+D PO Take 1 tablet by mouth at bedtime.   levothyroxine 75 MCG tablet Commonly known as: SYNTHROID TAKE 1 TABLET (75 MCG TOTAL) BY MOUTH DAILY BEFORE BREAKFAST.   lidocaine-prilocaine cream Commonly known as: EMLA APPLY TO AFFECTED AREA AS DIRECTED AS NEEDED   Lonsurf 20-8.19 MG tablet Generic drug: trifluridine-tipiracil Take 2 tablets (40 mg of trifluridine total) by mouth 2 (two) times daily after a meal. Take 1 hr after AM & PM meals on days 1-5, 8-12. Repeat every 28day   loperamide 2 MG capsule Commonly known as: IMODIUM TAKE 2 TABLETS (4 MG TOTAL) BY MOUTH 3 (THREE) TIMES DAILY AS NEEDED. TAKE 2 AT DIARRHEA ONSET , THEN 1 EVERY 2HR UNTIL 12HRS WITH NO BM. MAY TAKE 2 EVERY 4HRS AT NIGHT. IF DIARRHEA RECURS REPEAT.   meloxicam 7.5 MG tablet Commonly known as: Mobic Take 1 tablet (7.5 mg total) by mouth daily. You MUST take with food!!   metoprolol tartrate 25 MG tablet Commonly known as: LOPRESSOR Take 50 mg by mouth 2 (two) times daily.   multivitamin with minerals Tabs tablet Take 1 tablet by mouth daily.   naproxen 500 MG EC tablet Commonly known as: EC NAPROSYN Take 500 mg by mouth 2 (two) times daily with a meal.   ondansetron 4 MG tablet Commonly known as:  Zofran Take 1 tablet (4 mg total) by mouth every 8 (eight) hours as needed for nausea or vomiting.   potassium chloride SA 20 MEQ tablet Commonly known as: KLOR-CON Take 1 tablet (20 mEq total) by mouth daily.   VITAMIN B-12 PO Take 1 tablet by mouth daily.       Allergies: No Known Allergies  Past Medical History, Surgical history, Social history, and Family History were reviewed and updated.  Review of Systems: All other 10 point review of systems is negative.   Physical Exam:  vitals were not taken for this visit.   Wt Readings from Last 3 Encounters:  02/10/20 181 lb (82.1 kg)    02/10/20 181 lb (82.1 kg)  01/09/20 183 lb (83 kg)    Ocular: Sclerae unicteric, pupils equal, round and reactive to light Ear-nose-throat: Oropharynx clear, dentition fair Lymphatic: No cervical or supraclavicular adenopathy Lungs no rales or rhonchi, good excursion bilaterally Heart regular rate and rhythm, no murmur appreciated Abd soft, nontender, positive bowel sounds, no liver or spleen tip palpated on exam, no fluid wave  MSK no focal spinal tenderness, no joint edema Neuro: non-focal, well-oriented, appropriate affect Breasts: Deferred   Lab Results  Component Value Date   WBC 20.0 (H) 03/11/2020   HGB 8.2 (L) 03/11/2020   HCT 24.4 (L) 03/11/2020   MCV 96.1 03/11/2020   PLT 393 03/11/2020   Lab Results  Component Value Date   FERRITIN 1,086 (H) 02/10/2020   IRON 82 02/10/2020   TIBC 153 (L) 02/10/2020   UIBC 70 (L) 02/10/2020   IRONPCTSAT 54 02/10/2020   Lab Results  Component Value Date   RETICCTPCT 1.4 03/11/2020   RBC 2.54 (L) 03/11/2020   RBC 2.52 (L) 03/11/2020   No results found for: KPAFRELGTCHN, LAMBDASER, KAPLAMBRATIO No results found for: IGGSERUM, IGA, IGMSERUM No results found for: Odetta Pink, SPEI   Chemistry      Component Value Date/Time   NA 135 02/10/2020 1115   NA 144 04/26/2017 0830   NA 137 09/30/2016 0940   K 4.2 02/10/2020 1115   K 4.1 04/26/2017 0830   K 4.7 09/30/2016 0940   CL 104 02/10/2020 1115   CL 108 04/26/2017 0830   CO2 24 02/10/2020 1115   CO2 27 04/26/2017 0830   CO2 18 (L) 09/30/2016 0940   BUN 18 02/10/2020 1115   BUN 25 (H) 04/26/2017 0830   BUN 21.5 09/30/2016 0940   CREATININE 1.25 (H) 02/10/2020 1115   CREATININE 1.5 (H) 04/26/2017 0830   CREATININE 1.8 (H) 09/30/2016 0940      Component Value Date/Time   CALCIUM 9.5 02/10/2020 1115   CALCIUM 9.9 04/26/2017 0830   CALCIUM 10.4 09/30/2016 0940   ALKPHOS 126 02/10/2020 1115   ALKPHOS 124 (H)  04/26/2017 0830   ALKPHOS 147 09/30/2016 0940   AST 19 02/10/2020 1115   AST 27 09/30/2016 0940   ALT 4 02/10/2020 1115   ALT 16 04/26/2017 0830   ALT 11 09/30/2016 0940   BILITOT 0.5 02/10/2020 1115   BILITOT 0.34 09/30/2016 0940       Impression and Plan: Melanie [name] is a very pleasant 85 yo African American female with adenocarcinoma of the descending colon that has recurred and is now metastatic. She is symptomatic with fatigue and SOb with exertion.  We will bring her back in for 1 unit of blood tomorrow. We will get an epo level today as well.  We  went over her lab work in detail and I sent her home with a copy.  She did not feel she could urinate in a cup at this time and will give [place] a sample tomorrow.  I spoke with Dr. Marin Olp regarding her Frankey Poot and she will continue her same regimen for now.  CEA drawn today and result is pending. Follow-up in 4 weeks.  She can contact our office with any questions or concerns. We can certainly see her sooner if needed.   Laverna Peace, NP 10/13/20213:28 PM

## 2020-03-11 NOTE — Patient Instructions (Signed)

## 2020-03-12 ENCOUNTER — Inpatient Hospital Stay: Payer: Medicare Other

## 2020-03-12 VITALS — BP 126/67 | HR 78 | Temp 98.2°F | Resp 18

## 2020-03-12 DIAGNOSIS — D649 Anemia, unspecified: Secondary | ICD-10-CM

## 2020-03-12 DIAGNOSIS — D500 Iron deficiency anemia secondary to blood loss (chronic): Secondary | ICD-10-CM

## 2020-03-12 DIAGNOSIS — C186 Malignant neoplasm of descending colon: Secondary | ICD-10-CM | POA: Diagnosis not present

## 2020-03-12 DIAGNOSIS — C787 Secondary malignant neoplasm of liver and intrahepatic bile duct: Secondary | ICD-10-CM

## 2020-03-12 DIAGNOSIS — Z23 Encounter for immunization: Secondary | ICD-10-CM

## 2020-03-12 DIAGNOSIS — C189 Malignant neoplasm of colon, unspecified: Secondary | ICD-10-CM

## 2020-03-12 LAB — IRON AND TIBC
Iron: 21 ug/dL — ABNORMAL LOW (ref 41–142)
Saturation Ratios: 15 % — ABNORMAL LOW (ref 21–57)
TIBC: 139 ug/dL — ABNORMAL LOW (ref 236–444)
UIBC: 118 ug/dL — ABNORMAL LOW (ref 120–384)

## 2020-03-12 LAB — FERRITIN: Ferritin: 1442 ng/mL — ABNORMAL HIGH (ref 11–307)

## 2020-03-12 LAB — LACTATE DEHYDROGENASE: LDH: 311 U/L — ABNORMAL HIGH (ref 98–192)

## 2020-03-12 LAB — CEA (IN HOUSE-CHCC): CEA (CHCC-In House): 223.2 ng/mL — ABNORMAL HIGH (ref 0.00–5.00)

## 2020-03-12 MED ORDER — SODIUM CHLORIDE 0.9% FLUSH
10.0000 mL | INTRAVENOUS | Status: DC | PRN
  Administered 2020-03-12: 10 mL via INTRAVENOUS
  Filled 2020-03-12: qty 10

## 2020-03-12 MED ORDER — SODIUM CHLORIDE 0.9% IV SOLUTION
250.0000 mL | Freq: Once | INTRAVENOUS | Status: AC
  Administered 2020-03-12: 250 mL via INTRAVENOUS
  Filled 2020-03-12: qty 250

## 2020-03-12 MED ORDER — ACETAMINOPHEN 325 MG PO TABS
ORAL_TABLET | ORAL | Status: AC
  Filled 2020-03-12: qty 2

## 2020-03-12 MED ORDER — DIPHENHYDRAMINE HCL 25 MG PO CAPS
ORAL_CAPSULE | ORAL | Status: AC
  Filled 2020-03-12: qty 1

## 2020-03-12 MED ORDER — SODIUM CHLORIDE 0.9% FLUSH
3.0000 mL | INTRAVENOUS | Status: DC | PRN
  Filled 2020-03-12: qty 10

## 2020-03-12 MED ORDER — ACETAMINOPHEN 325 MG PO TABS
650.0000 mg | ORAL_TABLET | Freq: Once | ORAL | Status: AC
  Administered 2020-03-12: 650 mg via ORAL

## 2020-03-12 MED ORDER — SODIUM CHLORIDE 0.9 % IV SOLN
510.0000 mg | Freq: Once | INTRAVENOUS | Status: AC
  Administered 2020-03-12: 510 mg via INTRAVENOUS
  Filled 2020-03-12: qty 510

## 2020-03-12 MED ORDER — INFLUENZA VAC SPLIT QUAD 0.5 ML IM SUSY
PREFILLED_SYRINGE | INTRAMUSCULAR | Status: AC
  Filled 2020-03-12: qty 0.5

## 2020-03-12 MED ORDER — DIPHENHYDRAMINE HCL 25 MG PO CAPS
25.0000 mg | ORAL_CAPSULE | Freq: Once | ORAL | Status: AC
  Administered 2020-03-12: 25 mg via ORAL

## 2020-03-12 MED ORDER — INFLUENZA VAC SPLIT QUAD 0.5 ML IM SUSY
0.5000 mL | PREFILLED_SYRINGE | Freq: Once | INTRAMUSCULAR | Status: AC
  Administered 2020-03-12: 0.5 mL via INTRAMUSCULAR

## 2020-03-12 MED ORDER — HEPARIN SOD (PORK) LOCK FLUSH 100 UNIT/ML IV SOLN
500.0000 [IU] | Freq: Once | INTRAVENOUS | Status: AC
  Administered 2020-03-12: 500 [IU] via INTRAVENOUS
  Filled 2020-03-12: qty 5

## 2020-03-12 NOTE — Patient Instructions (Signed)

## 2020-03-12 NOTE — Progress Notes (Signed)
Ok to proceed with Fluarix today per MD.   Hardie Pulley, BCPS, BCOP

## 2020-03-13 LAB — TYPE AND SCREEN
ABO/RH(D): O POS
Antibody Screen: NEGATIVE
Unit division: 0

## 2020-03-13 LAB — BPAM RBC
Blood Product Expiration Date: 202111092359
ISSUE DATE / TIME: 202110140757
Unit Type and Rh: 5100

## 2020-03-13 LAB — ERYTHROPOIETIN: Erythropoietin: 54.9 m[IU]/mL — ABNORMAL HIGH (ref 2.6–18.5)

## 2020-03-16 ENCOUNTER — Other Ambulatory Visit: Payer: Self-pay | Admitting: Hematology & Oncology

## 2020-03-19 ENCOUNTER — Ambulatory Visit: Payer: Medicare Other

## 2020-04-08 ENCOUNTER — Inpatient Hospital Stay: Payer: Medicare Other

## 2020-04-08 ENCOUNTER — Inpatient Hospital Stay: Payer: Medicare Other | Attending: Hematology & Oncology | Admitting: Hematology & Oncology

## 2020-04-08 ENCOUNTER — Telehealth: Payer: Self-pay

## 2020-04-08 ENCOUNTER — Other Ambulatory Visit: Payer: Self-pay

## 2020-04-08 ENCOUNTER — Other Ambulatory Visit: Payer: Self-pay | Admitting: Family

## 2020-04-08 ENCOUNTER — Encounter: Payer: Self-pay | Admitting: Hematology & Oncology

## 2020-04-08 ENCOUNTER — Other Ambulatory Visit: Payer: Self-pay | Admitting: *Deleted

## 2020-04-08 VITALS — BP 142/70 | HR 121 | Temp 99.8°F | Resp 20 | Wt 172.0 lb

## 2020-04-08 DIAGNOSIS — C186 Malignant neoplasm of descending colon: Secondary | ICD-10-CM | POA: Insufficient documentation

## 2020-04-08 DIAGNOSIS — Z933 Colostomy status: Secondary | ICD-10-CM | POA: Insufficient documentation

## 2020-04-08 DIAGNOSIS — C189 Malignant neoplasm of colon, unspecified: Secondary | ICD-10-CM

## 2020-04-08 DIAGNOSIS — D649 Anemia, unspecified: Secondary | ICD-10-CM

## 2020-04-08 DIAGNOSIS — R059 Cough, unspecified: Secondary | ICD-10-CM | POA: Diagnosis not present

## 2020-04-08 DIAGNOSIS — C7801 Secondary malignant neoplasm of right lung: Secondary | ICD-10-CM | POA: Insufficient documentation

## 2020-04-08 DIAGNOSIS — R12 Heartburn: Secondary | ICD-10-CM | POA: Insufficient documentation

## 2020-04-08 DIAGNOSIS — R399 Unspecified symptoms and signs involving the genitourinary system: Secondary | ICD-10-CM

## 2020-04-08 DIAGNOSIS — R531 Weakness: Secondary | ICD-10-CM | POA: Insufficient documentation

## 2020-04-08 DIAGNOSIS — C787 Secondary malignant neoplasm of liver and intrahepatic bile duct: Secondary | ICD-10-CM | POA: Diagnosis present

## 2020-04-08 DIAGNOSIS — R5383 Other fatigue: Secondary | ICD-10-CM | POA: Diagnosis not present

## 2020-04-08 DIAGNOSIS — C7802 Secondary malignant neoplasm of left lung: Secondary | ICD-10-CM | POA: Diagnosis not present

## 2020-04-08 DIAGNOSIS — E43 Unspecified severe protein-calorie malnutrition: Secondary | ICD-10-CM | POA: Diagnosis not present

## 2020-04-08 DIAGNOSIS — R110 Nausea: Secondary | ICD-10-CM | POA: Insufficient documentation

## 2020-04-08 DIAGNOSIS — Z79899 Other long term (current) drug therapy: Secondary | ICD-10-CM | POA: Insufficient documentation

## 2020-04-08 DIAGNOSIS — D500 Iron deficiency anemia secondary to blood loss (chronic): Secondary | ICD-10-CM

## 2020-04-08 LAB — CMP (CANCER CENTER ONLY)
ALT: <5 U/L (ref 0–44)
AST: 25 U/L (ref 15–41)
Albumin: 2.8 g/dL — ABNORMAL LOW (ref 3.5–5.0)
Alkaline Phosphatase: 219 U/L — ABNORMAL HIGH (ref 38–126)
Anion gap: 8 (ref 5–15)
BUN: 16 mg/dL (ref 8–23)
CO2: 21 mmol/L — ABNORMAL LOW (ref 22–32)
Calcium: 9.2 mg/dL (ref 8.9–10.3)
Chloride: 104 mmol/L (ref 98–111)
Creatinine: 1.22 mg/dL — ABNORMAL HIGH (ref 0.44–1.00)
GFR, Estimated: 44 mL/min — ABNORMAL LOW (ref >60)
Glucose, Bld: 127 mg/dL — ABNORMAL HIGH (ref 70–99)
Potassium: 3.4 mmol/L — ABNORMAL LOW (ref 3.5–5.1)
Sodium: 133 mmol/L — ABNORMAL LOW (ref 135–145)
Total Bilirubin: 0.6 mg/dL (ref 0.3–1.2)
Total Protein: 6.6 g/dL (ref 6.5–8.1)

## 2020-04-08 LAB — CBC WITH DIFFERENTIAL (CANCER CENTER ONLY)
Abs Immature Granulocytes: 0.43 10*3/uL — ABNORMAL HIGH (ref 0.00–0.07)
Basophils Absolute: 0.1 10*3/uL (ref 0.0–0.1)
Basophils Relative: 0 %
Eosinophils Absolute: 0 10*3/uL (ref 0.0–0.5)
Eosinophils Relative: 0 %
HCT: 22.5 % — ABNORMAL LOW (ref 36.0–46.0)
Hemoglobin: 7.6 g/dL — ABNORMAL LOW (ref 12.0–15.0)
Immature Granulocytes: 2 %
Lymphocytes Relative: 3 %
Lymphs Abs: 0.6 10*3/uL — ABNORMAL LOW (ref 0.7–4.0)
MCH: 32.6 pg (ref 26.0–34.0)
MCHC: 33.8 g/dL (ref 30.0–36.0)
MCV: 96.6 fL (ref 80.0–100.0)
Monocytes Absolute: 1.1 10*3/uL — ABNORMAL HIGH (ref 0.1–1.0)
Monocytes Relative: 5 %
Neutro Abs: 19.3 10*3/uL — ABNORMAL HIGH (ref 1.7–7.7)
Neutrophils Relative %: 90 %
Platelet Count: 361 10*3/uL (ref 150–400)
RBC: 2.33 MIL/uL — ABNORMAL LOW (ref 3.87–5.11)
RDW: 18.9 % — ABNORMAL HIGH (ref 11.5–15.5)
WBC Count: 21.6 10*3/uL — ABNORMAL HIGH (ref 4.0–10.5)
nRBC: 0.8 % — ABNORMAL HIGH (ref 0.0–0.2)

## 2020-04-08 LAB — RETICULOCYTES
Immature Retic Fract: 12.4 % (ref 2.3–15.9)
RBC.: 2.32 MIL/uL — ABNORMAL LOW (ref 3.87–5.11)
Retic Count, Absolute: 38.5 10*3/uL (ref 19.0–186.0)
Retic Ct Pct: 1.7 % (ref 0.4–3.1)

## 2020-04-08 LAB — PREPARE RBC (CROSSMATCH)

## 2020-04-08 LAB — SAMPLE TO BLOOD BANK

## 2020-04-08 LAB — LACTATE DEHYDROGENASE: LDH: 298 U/L — ABNORMAL HIGH (ref 98–192)

## 2020-04-08 MED ORDER — DIPHENOXYLATE-ATROPINE 2.5-0.025 MG PO TABS: 2.0000 | ORAL_TABLET | Freq: Four times a day (QID) | ORAL | 0 refills | Status: DC | PRN

## 2020-04-08 NOTE — Progress Notes (Signed)
Hematology and Oncology Follow Up Visit  Melanie [name] [id number] 06-26-1934 85 y.o. 04/08/2020   Principle Diagnosis:  Metastatic colon cancer -- progressive mets -- NO actionable mutation/LOW TMB/ MSI stable/ KRAS (+)/HER2-  Past Therapy: FOLFOX q 14 days s/p cycle 3 - DC'd secondary to non-tolerance RFA surgery - January 2019 Xeloda 1500 mg po BID (14/7) -- s/p c#4- start on 11/20/2018 -- d/c on 02/28/2019 Avastin 7.5 mg/kg IV q 3 week -- S/p c#4 -started on 06//23/2020 -- d/c on 02/28/2019 Stivarga 80 mg po q day (14 on/7 off) -- start on 04/24/2019-- d/c on 07/10/2019 due to progression FOLFIRI -- started02/24/2021, s/p cycle #4 -- d/c on 10/23/2019  Current Therapy: Lonsurf 60 mg po bid (2 week on/ 2 weeks off) -- start in 11/2019 IV Ironas indicated   Interim History:  Ms. Melanie [name] is here today for follow-up.  She has a little bit of a temperature.  She really has no symptoms.  She does not have a cough.  There is a little bit of diarrhea.  She is on Imodium.  We will see about putting her on some Lomotil.  Her CEA has been coming down slowly.  Back in October it was 223.  She has not had a CT scan for several months.  Going to have to get this set up.  The Lonsurf definitely has caused some fatigue.  She is certainly anemic.  We will have to transfuse her.  She has not had any bleeding.  There is been no rashes.  She has had a little bit of leg swelling.  Her leg swelling probably is from her anemia.  Her iron studies back in October showed a ferritin of 1400 but iron saturation was only 15%.  I really hope that she will have a nice Thanksgiving.  Currently, her performance status is ECOG 2.  Medications:  Allergies as of 04/08/2020   No Known Allergies     Medication List       Accurate as of April 08, 2020 12:05 PM. If you have any questions, ask your nurse or doctor.        amlodipine-atorvastatin 10-10 MG tablet Commonly  known as: CADUET Take 1 tablet by mouth daily.   aspirin 81 MG chewable tablet Chew by mouth daily.   CALTRATE 600+D PO Take 1 tablet by mouth at bedtime.   levothyroxine 75 MCG tablet Commonly known as: SYNTHROID TAKE 1 TABLET (75 MCG TOTAL) BY MOUTH DAILY BEFORE BREAKFAST.   lidocaine-prilocaine cream Commonly known as: EMLA APPLY TO AFFECTED AREA AS DIRECTED AS NEEDED   Lonsurf 20-8.19 MG tablet Generic drug: trifluridine-tipiracil Take 2 tablets (40 mg of trifluridine total) by mouth 2 (two) times daily after a meal. Take 1 hr after AM & PM meals on days 1-5, 8-12. Repeat every 28day   loperamide 2 MG capsule Commonly known as: IMODIUM TAKE 2 TABLETS (4 MG TOTAL) BY MOUTH 3 (THREE) TIMES DAILY AS NEEDED. TAKE 2 AT DIARRHEA ONSET , THEN 1 EVERY 2HR UNTIL 12HRS WITH NO BM. MAY TAKE 2 EVERY 4HRS AT NIGHT. IF DIARRHEA RECURS REPEAT.   meloxicam 7.5 MG tablet Commonly known as: Mobic Take 1 tablet (7.5 mg total) by mouth daily. You MUST take with food!!   metoprolol tartrate 25 MG tablet Commonly known as: LOPRESSOR Take 50 mg by mouth 2 (two) times daily.   multivitamin with minerals Tabs tablet Take 1 tablet by mouth daily.   naproxen 500 MG EC tablet Commonly known  as: EC NAPROSYN Take 500 mg by mouth 2 (two) times daily with a meal.   ondansetron 4 MG tablet Commonly known as: Zofran Take 1 tablet (4 mg total) by mouth every 8 (eight) hours as needed for nausea or vomiting.   potassium chloride SA 20 MEQ tablet Commonly known as: KLOR-CON Take 1 tablet (20 mEq total) by mouth daily.   VITAMIN B-12 PO Take 1 tablet by mouth daily.       Allergies: No Known Allergies  Past Medical History, Surgical history, Social history, and Family History were reviewed and updated.  Review of Systems: All other 10 point review of systems is negative.   Physical Exam:  weight is 172 lb (78 kg). Her oral temperature is 99.8 F (37.7 C). Her blood pressure is 142/70  (abnormal) and her pulse is 121 (abnormal). Her respiration is 20 and oxygen saturation is 97%.   Wt Readings from Last 3 Encounters:  04/08/20 172 lb (78 kg)  03/11/20 178 lb 1 oz (80.8 kg)  02/10/20 181 lb (82.1 kg)    Ocular: Sclerae unicteric, pupils equal, round and reactive to light Ear-nose-throat: Oropharynx clear, dentition fair Lymphatic: No cervical or supraclavicular adenopathy Lungs no rales or rhonchi, good excursion bilaterally Heart regular rate and rhythm, no murmur appreciated Abd soft, nontender, positive bowel sounds, no liver or spleen tip palpated on exam, no fluid wave  MSK no focal spinal tenderness, no joint edema Neuro: non-focal, well-oriented, appropriate affect Breasts: Deferred   Lab Results  Component Value Date   WBC 21.6 (H) 04/08/2020   HGB 7.6 (L) 04/08/2020   HCT 22.5 (L) 04/08/2020   MCV 96.6 04/08/2020   PLT 361 04/08/2020   Lab Results  Component Value Date   FERRITIN 1,442 (H) 03/11/2020   IRON 21 (L) 03/11/2020   TIBC 139 (L) 03/11/2020   UIBC 118 (L) 03/11/2020   IRONPCTSAT 15 (L) 03/11/2020   Lab Results  Component Value Date   RETICCTPCT 1.7 04/08/2020   RBC 2.32 (L) 04/08/2020   No results found for: KPAFRELGTCHN, LAMBDASER, KAPLAMBRATIO No results found for: IGGSERUM, IGA, IGMSERUM No results found for: Kathrynn Ducking, MSPIKE, SPEI   Chemistry      Component Value Date/Time   NA 133 (L) 04/08/2020 1040   NA 144 04/26/2017 0830   NA 137 09/30/2016 0940   K 3.4 (L) 04/08/2020 1040   K 4.1 04/26/2017 0830   K 4.7 09/30/2016 0940   CL 104 04/08/2020 1040   CL 108 04/26/2017 0830   CO2 21 (L) 04/08/2020 1040   CO2 27 04/26/2017 0830   CO2 18 (L) 09/30/2016 0940   BUN 16 04/08/2020 1040   BUN 25 (H) 04/26/2017 0830   BUN 21.5 09/30/2016 0940   CREATININE 1.22 (H) 04/08/2020 1040   CREATININE 1.5 (H) 04/26/2017 0830   CREATININE 1.8 (H) 09/30/2016 0940      Component Value  Date/Time   CALCIUM 9.2 04/08/2020 1040   CALCIUM 9.9 04/26/2017 0830   CALCIUM 10.4 09/30/2016 0940   ALKPHOS 219 (H) 04/08/2020 1040   ALKPHOS 124 (H) 04/26/2017 0830   ALKPHOS 147 09/30/2016 0940   AST 25 04/08/2020 1040   AST 27 09/30/2016 0940   ALT <5 04/08/2020 1040   ALT 16 04/26/2017 0830   ALT 11 09/30/2016 0940   BILITOT 0.6 04/08/2020 1040   BILITOT 0.34 09/30/2016 0940       Impression and Plan: Ms. Cleverly is a  very pleasant 85 yo [place] American female with adenocarcinoma of the descending colon that has recurred and is now metastatic.  I told her just to finish up this cycle of Lonsurf.  We will have to get CT scans to see how everything looks.  We really have to see if this is working.  It would not surprise me if she does not have a response.  I fear that the temperatures might be from her liver metastasis.  We will get her scans done the week after Thanksgiving..  We will transfuse her in 2 days.  She will get some iron in 2 days.  I just want quality of life to be our primary goal.    [name], MD 11/10/202112:05 PM

## 2020-04-08 NOTE — Telephone Encounter (Signed)
Called and s/w pt and she is aware of her 04/29/20 appts, she will also have this printed at her 11/12 appt.Marland KitchenMarland KitchenAOM

## 2020-04-09 LAB — IRON AND TIBC
Iron: 24 ug/dL — ABNORMAL LOW (ref 41–142)
Saturation Ratios: 21 % (ref 21–57)
TIBC: 118 ug/dL — ABNORMAL LOW (ref 236–444)
UIBC: 93 ug/dL — ABNORMAL LOW (ref 120–384)

## 2020-04-09 LAB — CEA (IN HOUSE-CHCC): CEA (CHCC-In House): 301 ng/mL — ABNORMAL HIGH (ref 0.00–5.00)

## 2020-04-09 LAB — FERRITIN: Ferritin: 2125 ng/mL — ABNORMAL HIGH (ref 11–307)

## 2020-04-10 ENCOUNTER — Inpatient Hospital Stay: Payer: Medicare Other

## 2020-04-10 ENCOUNTER — Other Ambulatory Visit: Payer: Self-pay

## 2020-04-10 VITALS — BP 103/57 | HR 68 | Temp 98.7°F | Resp 16

## 2020-04-10 DIAGNOSIS — D649 Anemia, unspecified: Secondary | ICD-10-CM

## 2020-04-10 DIAGNOSIS — C186 Malignant neoplasm of descending colon: Secondary | ICD-10-CM | POA: Diagnosis not present

## 2020-04-10 DIAGNOSIS — D500 Iron deficiency anemia secondary to blood loss (chronic): Secondary | ICD-10-CM

## 2020-04-10 DIAGNOSIS — C189 Malignant neoplasm of colon, unspecified: Secondary | ICD-10-CM

## 2020-04-10 MED ORDER — SODIUM CHLORIDE 0.9 % IV SOLN
510.0000 mg | Freq: Once | INTRAVENOUS | Status: AC
  Administered 2020-04-10: 510 mg via INTRAVENOUS
  Filled 2020-04-10: qty 17

## 2020-04-10 MED ORDER — DIPHENHYDRAMINE HCL 25 MG PO CAPS
25.0000 mg | ORAL_CAPSULE | Freq: Once | ORAL | Status: AC
  Administered 2020-04-10: 25 mg via ORAL

## 2020-04-10 MED ORDER — HEPARIN SOD (PORK) LOCK FLUSH 100 UNIT/ML IV SOLN
500.0000 [IU] | Freq: Once | INTRAVENOUS | Status: AC
  Administered 2020-04-10: 500 [IU] via INTRAVENOUS
  Filled 2020-04-10: qty 5

## 2020-04-10 MED ORDER — ACETAMINOPHEN 325 MG PO TABS
650.0000 mg | ORAL_TABLET | Freq: Once | ORAL | Status: AC
  Administered 2020-04-10: 650 mg via ORAL

## 2020-04-10 MED ORDER — ACETAMINOPHEN 325 MG PO TABS
ORAL_TABLET | ORAL | Status: AC
  Filled 2020-04-10: qty 2

## 2020-04-10 MED ORDER — SODIUM CHLORIDE 0.9 % IV SOLN
INTRAVENOUS | Status: DC
  Filled 2020-04-10 (×2): qty 250

## 2020-04-10 MED ORDER — SODIUM CHLORIDE 0.9% FLUSH
10.0000 mL | Freq: Once | INTRAVENOUS | Status: AC
  Administered 2020-04-10: 10 mL via INTRAVENOUS
  Filled 2020-04-10: qty 10

## 2020-04-10 MED ORDER — DIPHENHYDRAMINE HCL 25 MG PO CAPS
ORAL_CAPSULE | ORAL | Status: AC
  Filled 2020-04-10: qty 1

## 2020-04-10 NOTE — Progress Notes (Signed)
Pt discharged in no apparent distress. Pt left ambulatory without assistance. Pt aware of discharge instructions and verbalized understanding and had no further questions.  

## 2020-04-10 NOTE — Patient Instructions (Signed)
Iron Sucrose injection What is this medicine? IRON SUCROSE (AHY ern SOO krohs) is an iron complex. Iron is used to make healthy red blood cells, which carry oxygen and nutrients throughout the body. This medicine is used to treat iron deficiency anemia in people with chronic kidney disease. This medicine may be used for other purposes; ask your health care provider or pharmacist if you have questions. COMMON BRAND NAME(S): Venofer What should I tell my health care provider before I take this medicine? They need to know if you have any of these conditions:  anemia not caused by low iron levels  heart disease  high levels of iron in the blood  kidney disease  liver disease  an unusual or allergic reaction to iron, other medicines, foods, dyes, or preservatives  pregnant or trying to get pregnant  breast-feeding How should I use this medicine? This medicine is for infusion into a vein. It is given by a health care professional in a hospital or clinic setting. Talk to your pediatrician regarding the use of this medicine in children. While this drug may be prescribed for children as young as 2 years for selected conditions, precautions do apply. Overdosage: If you think you have taken too much of this medicine contact a poison control center or emergency room at once. NOTE: This medicine is only for you. Do not share this medicine with others. What if I miss a dose? It is important not to miss your dose. Call your doctor or health care professional if you are unable to keep an appointment. What may interact with this medicine? Do not take this medicine with any of the following medications:  deferoxamine  dimercaprol  other iron products This medicine may also interact with the following medications:  chloramphenicol  deferasirox This list may not describe all possible interactions. Give your health care provider a list of all the medicines, herbs, non-prescription drugs, or  dietary supplements you use. Also tell them if you smoke, drink alcohol, or use illegal drugs. Some items may interact with your medicine. What should I watch for while using this medicine? Visit your doctor or healthcare professional regularly. Tell your doctor or healthcare professional if your symptoms do not start to get better or if they get worse. You may need blood work done while you are taking this medicine. You may need to follow a special diet. Talk to your doctor. Foods that contain iron include: whole grains/cereals, dried fruits, beans, or peas, leafy green vegetables, and organ meats (liver, kidney). What side effects may I notice from receiving this medicine? Side effects that you should report to your doctor or health care professional as soon as possible:  allergic reactions like skin rash, itching or hives, swelling of the face, lips, or tongue  breathing problems  changes in blood pressure  cough  fast, irregular heartbeat  feeling faint or lightheaded, falls  fever or chills  flushing, sweating, or hot feelings  joint or muscle aches/pains  seizures  swelling of the ankles or feet  unusually weak or tired Side effects that usually do not require medical attention (report to your doctor or health care professional if they continue or are bothersome):  diarrhea  feeling achy  headache  irritation at site where injected  nausea, vomiting  stomach upset  tiredness This list may not describe all possible side effects. Call your doctor for medical advice about side effects. You may report side effects to FDA at 1-800-FDA-1088. Where should I keep   my medicine? This drug is given in a hospital or clinic and will not be stored at home. NOTE: This sheet is a summary. It may not cover all possible information. If you have questions about this medicine, talk to your doctor, pharmacist, or health care provider.  2020 Elsevier/Gold Standard (2011-02-24  17:14:35) https://www.redcrossblood.org/donate-blood/blood-donation-process/what-happens-to-donated-blood/blood-transfusions/types-of-blood-transfusions.html"> https://www.hematology.org/education/patients/blood-basics/blood-safety-and-matching"> https://www.nhlbi.nih.gov/health-topics/blood-transfusion">  Blood Transfusion, Adult A blood transfusion is a procedure in which you receive blood or a type of blood cell (blood component) through an IV. You may need a blood transfusion when your blood level is low. This may result from a bleeding disorder, illness, injury, or surgery. The blood may come from a donor. You may also be able to donate blood for yourself (autologous blood donation) before a planned surgery. The blood given in a transfusion is made up of different blood components. You may receive:  Red blood cells. These carry oxygen to the cells in the body.  Platelets. These help your blood to clot.  Plasma. This is the liquid part of your blood. It carries proteins and other substances throughout the body.  White blood cells. These help you fight infections. If you have hemophilia or another clotting disorder, you may also receive other types of blood products. Tell a health care provider about:  Any blood disorders you have.  Any previous reactions you have had during a blood transfusion.  Any allergies you have.  All medicines you are taking, including vitamins, herbs, eye drops, creams, and over-the-counter medicines.  Any surgeries you have had.  Any medical conditions you have, including any recent fever or cold symptoms.  Whether you are pregnant or may be pregnant. What are the risks? Generally, this is a safe procedure. However, problems may occur.  The most common problems include: ? A mild allergic reaction, such as red, swollen areas of skin (hives) and itching. ? Fever or chills. This may be the body's response to new blood cells received. This may occur during  or up to 4 hours after the transfusion.  More serious problems may include: ? Transfusion-associated circulatory overload (TACO), or too much fluid in the lungs. This may cause breathing problems. ? A serious allergic reaction, such as difficulty breathing or swelling around the face and lips. ? Transfusion-related acute lung injury (TRALI), which causes breathing difficulty and low oxygen in the blood. This can occur within hours of the transfusion or several days later. ? Iron overload. This can happen after receiving many blood transfusions over a period of time. ? Infection or virus being transmitted. This is rare because donated blood is carefully tested before it is given. ? Hemolytic transfusion reaction. This is rare. It happens when your body's defense system (immune system)tries to attack the new blood cells. Symptoms may include fever, chills, nausea, low blood pressure, and low back or chest pain. ? Transfusion-associated graft-versus-host disease (TAGVHD). This is rare. It happens when donated cells attack your body's healthy tissues. What happens before the procedure? Medicines Ask your health care provider about:  Changing or stopping your regular medicines. This is especially important if you are taking diabetes medicines or blood thinners.  Taking medicines such as aspirin and ibuprofen. These medicines can thin your blood. Do not take these medicines unless your health care provider tells you to take them.  Taking over-the-counter medicines, vitamins, herbs, and supplements. General instructions  Follow instructions from your health care provider about eating and drinking restrictions.  You will have a blood test to determine your blood type. This  is necessary to know what kind of blood your body will accept and to match it to the donor blood.  If you are going to have a planned surgery, you may be able to do an autologous blood donation. This may be done in case you need  to have a transfusion.  You will have your temperature, blood pressure, and pulse monitored before the transfusion.  If you have had an allergic reaction to a transfusion in the past, you may be given medicine to help prevent a reaction. This medicine may be given to you by mouth (orally) or through an IV.  Set aside time for the blood transfusion. This procedure generally takes 1-4 hours to complete. What happens during the procedure?   An IV will be inserted into one of your veins.  The bag of donated blood will be attached to your IV. The blood will then enter through your vein.  Your temperature, blood pressure, and pulse will be monitored regularly during the transfusion. This monitoring is done to detect early signs of a transfusion reaction.  Tell your nurse right away if you have any of these symptoms during the transfusion: ? Shortness of breath or trouble breathing. ? Chest or back pain. ? Fever or chills. ? Hives or itching.  If you have any signs or symptoms of a reaction, your transfusion will be stopped and you may be given medicine.  When the transfusion is complete, your IV will be removed.  Pressure may be applied to the IV site for a few minutes.  A bandage (dressing)will be applied. The procedure may vary among health care providers and hospitals. What happens after the procedure?  Your temperature, blood pressure, pulse, breathing rate, and blood oxygen level will be monitored until you leave the hospital or clinic.  Your blood may be tested to see how you are responding to the transfusion.  You may be warmed with fluids or blankets to maintain a normal body temperature.  If you receive your blood transfusion in an outpatient setting, you will be told whom to contact to report any reactions. Where to find more information For more information on blood transfusions, visit the American Red Cross: redcross.org Summary  A blood transfusion is a procedure in  which you receive blood or a type of blood cell (blood component) through an IV.  The blood you receive may come from a donor or be donated by yourself (autologous blood donation) before a planned surgery.  The blood given in a transfusion is made up of different blood components. You may receive red blood cells, platelets, plasma, or white blood cells depending on the condition treated.  Your temperature, blood pressure, and pulse will be monitored before, during, and after the transfusion.  After the transfusion, your blood may be tested to see how your body has responded. This information is not intended to replace advice given to you by your health care provider. Make sure you discuss any questions you have with your health care provider. Document Revised: 11/08/2018 Document Reviewed: 11/08/2018 Elsevier Patient Education  [place].

## 2020-04-11 LAB — TYPE AND SCREEN
ABO/RH(D): O POS
Antibody Screen: NEGATIVE
Unit division: 0
Unit division: 0

## 2020-04-11 LAB — BPAM RBC
Blood Product Expiration Date: 202112102359
Blood Product Expiration Date: 202112102359
ISSUE DATE / TIME: 202111120818
ISSUE DATE / TIME: 202111120818
Unit Type and Rh: 5100
Unit Type and Rh: 5100

## 2020-04-14 ENCOUNTER — Telehealth: Payer: Self-pay

## 2020-04-14 NOTE — Telephone Encounter (Signed)
Called pts sister per inbasket to align with ctr scan, shwe is aware of the new appt times AOM

## 2020-04-17 ENCOUNTER — Other Ambulatory Visit (HOSPITAL_BASED_OUTPATIENT_CLINIC_OR_DEPARTMENT_OTHER): Payer: Medicare Other

## 2020-04-22 ENCOUNTER — Other Ambulatory Visit: Payer: Self-pay | Admitting: Hematology & Oncology

## 2020-04-27 ENCOUNTER — Other Ambulatory Visit: Payer: Self-pay | Admitting: *Deleted

## 2020-04-27 ENCOUNTER — Inpatient Hospital Stay: Payer: Medicare Other

## 2020-04-27 ENCOUNTER — Other Ambulatory Visit: Payer: Self-pay

## 2020-04-27 ENCOUNTER — Other Ambulatory Visit: Payer: Medicare Other

## 2020-04-27 ENCOUNTER — Inpatient Hospital Stay (HOSPITAL_BASED_OUTPATIENT_CLINIC_OR_DEPARTMENT_OTHER): Payer: Medicare Other | Admitting: Hematology & Oncology

## 2020-04-27 ENCOUNTER — Telehealth: Payer: Self-pay | Admitting: *Deleted

## 2020-04-27 ENCOUNTER — Encounter: Payer: Self-pay | Admitting: Hematology & Oncology

## 2020-04-27 ENCOUNTER — Encounter: Payer: Self-pay | Admitting: *Deleted

## 2020-04-27 ENCOUNTER — Ambulatory Visit (HOSPITAL_BASED_OUTPATIENT_CLINIC_OR_DEPARTMENT_OTHER)
Admission: RE | Admit: 2020-04-27 | Discharge: 2020-04-27 | Disposition: A | Discharge status: Home / Self Care | Payer: Medicare Other | Source: Ambulatory Visit | Attending: Hematology & Oncology | Admitting: Hematology & Oncology

## 2020-04-27 VITALS — BP 125/77 | HR 82 | Temp 97.7°F | Resp 19 | Wt 173.0 lb

## 2020-04-27 DIAGNOSIS — C186 Malignant neoplasm of descending colon: Secondary | ICD-10-CM

## 2020-04-27 DIAGNOSIS — R059 Cough, unspecified: Secondary | ICD-10-CM

## 2020-04-27 DIAGNOSIS — D500 Iron deficiency anemia secondary to blood loss (chronic): Secondary | ICD-10-CM | POA: Diagnosis not present

## 2020-04-27 LAB — CMP (CANCER CENTER ONLY)
ALT: 3 U/L (ref 0–44)
AST: 22 U/L (ref 15–41)
Albumin: 2.6 g/dL — ABNORMAL LOW (ref 3.5–5.0)
Alkaline Phosphatase: 315 U/L — ABNORMAL HIGH (ref 38–126)
Anion gap: 7 (ref 5–15)
BUN: 15 mg/dL (ref 8–23)
CO2: 24 mmol/L (ref 22–32)
Calcium: 9.1 mg/dL (ref 8.9–10.3)
Chloride: 101 mmol/L (ref 98–111)
Creatinine: 1.18 mg/dL — ABNORMAL HIGH (ref 0.44–1.00)
GFR, Estimated: 46 mL/min — ABNORMAL LOW (ref >60)
Glucose, Bld: 115 mg/dL — ABNORMAL HIGH (ref 70–99)
Potassium: 4.1 mmol/L (ref 3.5–5.1)
Sodium: 132 mmol/L — ABNORMAL LOW (ref 135–145)
Total Bilirubin: 0.5 mg/dL (ref 0.3–1.2)
Total Protein: 6.6 g/dL (ref 6.5–8.1)

## 2020-04-27 LAB — CBC WITH DIFFERENTIAL (CANCER CENTER ONLY)
Abs Immature Granulocytes: 0.44 10*3/uL — ABNORMAL HIGH (ref 0.00–0.07)
Basophils Absolute: 0.1 10*3/uL (ref 0.0–0.1)
Basophils Relative: 0 %
Eosinophils Absolute: 0 10*3/uL (ref 0.0–0.5)
Eosinophils Relative: 0 %
HCT: 28 % — ABNORMAL LOW (ref 36.0–46.0)
Hemoglobin: 9.2 g/dL — ABNORMAL LOW (ref 12.0–15.0)
Immature Granulocytes: 2 %
Lymphocytes Relative: 5 %
Lymphs Abs: 1.1 10*3/uL (ref 0.7–4.0)
MCH: 31.6 pg (ref 26.0–34.0)
MCHC: 32.9 g/dL (ref 30.0–36.0)
MCV: 96.2 fL (ref 80.0–100.0)
Monocytes Absolute: 2.1 10*3/uL — ABNORMAL HIGH (ref 0.1–1.0)
Monocytes Relative: 10 %
Neutro Abs: 17.9 10*3/uL — ABNORMAL HIGH (ref 1.7–7.7)
Neutrophils Relative %: 83 %
Platelet Count: 423 10*3/uL — ABNORMAL HIGH (ref 150–400)
RBC: 2.91 MIL/uL — ABNORMAL LOW (ref 3.87–5.11)
RDW: 18.8 % — ABNORMAL HIGH (ref 11.5–15.5)
WBC Count: 21.7 10*3/uL — ABNORMAL HIGH (ref 4.0–10.5)
nRBC: 0 % (ref 0.0–0.2)

## 2020-04-27 LAB — SAMPLE TO BLOOD BANK

## 2020-04-27 MED ORDER — HYDROCOD POLST-CPM POLST ER 10-8 MG/5ML PO SUER: 5.0000 mL | Freq: Two times a day (BID) | ORAL | 0 refills | Status: DC | PRN

## 2020-04-27 MED ORDER — SODIUM CHLORIDE 0.9% FLUSH
10.0000 mL | Freq: Once | INTRAVENOUS | Status: AC
  Administered 2020-04-27: 10 mL via INTRAVENOUS
  Filled 2020-04-27: qty 10

## 2020-04-27 MED ORDER — IOHEXOL 300 MG/ML  SOLN
100.0000 mL | Freq: Once | INTRAMUSCULAR | Status: AC | PRN
  Administered 2020-04-27: 80 mL via INTRAVENOUS

## 2020-04-27 MED ORDER — DRONABINOL 2.5 MG PO CAPS: 2.5000 mg | ORAL_CAPSULE | Freq: Two times a day (BID) | ORAL | 0 refills | Status: AC

## 2020-04-27 MED ORDER — HYDROCODONE-HOMATROPINE 5-1.5 MG/5ML PO SYRP
5.0000 mL | ORAL_SOLUTION | Freq: Four times a day (QID) | ORAL | 0 refills | Status: AC | PRN
Start: 2020-04-27 — End: ?

## 2020-04-27 MED ORDER — HEPARIN SOD (PORK) LOCK FLUSH 100 UNIT/ML IV SOLN
500.0000 [IU] | Freq: Once | INTRAVENOUS | Status: AC
  Administered 2020-04-27: 500 [IU] via INTRAVENOUS
  Filled 2020-04-27: qty 5

## 2020-04-27 NOTE — Patient Instructions (Signed)

## 2020-04-27 NOTE — Progress Notes (Signed)
Hematology and Oncology Follow Up Visit  Melanie [name] [id number] 1934/09/08 85 y.o. 04/27/2020   Principle Diagnosis:  Metastatic colon cancer -- progressive mets -- NO actionable mutation/LOW TMB/ MSI stable/ KRAS (+)/HER2- --progressive  Past Therapy: FOLFOX q 14 days s/p cycle 3 - DC'd secondary to non-tolerance RFA surgery - January 2019 Xeloda 1500 mg po BID (14/7) -- s/p c#4- start on 11/20/2018 -- d/c on 02/28/2019 Avastin 7.5 mg/kg IV q 3 week -- S/p c#4 -started on 06//23/2020 -- d/c on 02/28/2019 Stivarga 80 mg po q day (14 on/7 off) -- start on 04/24/2019-- d/c on 07/10/2019 due to progression FOLFIRI -- started02/24/2021, s/p cycle #4 -- d/c on 10/23/2019  Current Therapy: Lonsurf 60 mg po bid (2 week on/ 2 weeks off) -- start in 11/2019 --DC on 04/27/2020 secondary to progressive disease  IV Ironas indicated   Interim History:  Melanie [name] is here today for follow-up.  She comes in with her sister.  Unfortunately, it is obvious and clear that her cancer is now progressing.  Her last CA was up to 300.  We did do scans on her today.  She has progressive disease in the lung and liver.  We are at a point now where we have to focus on quality of life and comfort care.  There really is no other chemotherapy that we can really use for her.  I talked to her and her sister about all of this.  I just hate the fact that we are not seeing a response any longer.  She really has done nicely.  She has been dealing with metastatic colon cancer now for about 3 years.  I talked to her about quality of life as our primary goal.  She agrees with this.  In view of this, I really think that getting Hospice involved would not be a bad idea.  She and her family will talk about this.  At least, I told him that hospice can come out to talk with them.  I do not think there is anything hospice will need to do right now but least they will know about her when the time  comes for them to intervene.  I also discussed end-of-life issues with her.  She understands about being kept alive on a machine.  I told her that if she were to be kept alive on a machine, that she would not come off this and that it would be up to her family to turn the machine off.  She is going to think about her wishes for life support.  I hate that she is not eating well.  Her weight is holding stable which is good to see.  Her albumin is quite low which is a little troublesome.  She just does not have much of an appetite.  I will try her on some Megace.  She does have a cough.  It is a dry cough.  I suspect this probably is from her pulmonary metastasis.  I will also send in a prescription for Hycodan cough syrup that she can take 4 times a day if necessary.  She has had no bleeding.  There has been no diarrhea.  She is just weak.  Overall, performance status is ECOG 2.   Medications:  Allergies as of 04/27/2020   No Known Allergies     Medication List       Accurate as of April 27, 2020  4:24 PM. If you have any questions, ask your  nurse or doctor.        amlodipine-atorvastatin 10-10 MG tablet Commonly known as: CADUET Take 1 tablet by mouth daily.   aspirin 81 MG chewable tablet Chew by mouth daily.   CALTRATE 600+D PO Take 1 tablet by mouth at bedtime.   diphenoxylate-atropine 2.5-0.025 MG tablet Commonly known as: LOMOTIL Take 2 tablets by mouth 4 (four) times daily as needed for diarrhea or loose stools.   dronabinol 2.5 MG capsule Commonly known as: MARINOL Take 1 capsule (2.5 mg total) by mouth 2 (two) times daily before a meal. Started by: [name], MD   HYDROcodone-homatropine 5-1.5 MG/5ML syrup Commonly known as: Hycodan Take 5 mLs by mouth every 6 (six) hours as needed for cough. Started by: [name], MD   levothyroxine 75 MCG tablet Commonly known as: SYNTHROID TAKE 1 TABLET (75 MCG TOTAL) BY MOUTH DAILY BEFORE BREAKFAST.     lidocaine-prilocaine cream Commonly known as: EMLA APPLY TO AFFECTED AREA AS DIRECTED AS NEEDED   Lonsurf 20-8.19 MG tablet Generic drug: trifluridine-tipiracil Take 2 tablets (40 mg of trifluridine total) by mouth 2 (two) times daily after a meal. Take 1 hr after AM & PM meals on days 1-5, 8-12. Repeat every 28day   loperamide 2 MG capsule Commonly known as: IMODIUM TAKE 2 TABLETS (4 MG TOTAL) BY MOUTH 3 (THREE) TIMES DAILY AS NEEDED. TAKE 2 AT DIARRHEA ONSET , THEN 1 EVERY 2HR UNTIL 12HRS WITH NO BM. MAY TAKE 2 EVERY 4HRS AT NIGHT. IF DIARRHEA RECURS REPEAT.   meloxicam 7.5 MG tablet Commonly known as: Mobic Take 1 tablet (7.5 mg total) by mouth daily. You MUST take with food!!   metoprolol tartrate 25 MG tablet Commonly known as: LOPRESSOR Take 50 mg by mouth 2 (two) times daily.   multivitamin with minerals Tabs tablet Take 1 tablet by mouth daily.   naproxen 500 MG EC tablet Commonly known as: EC NAPROSYN Take 500 mg by mouth 2 (two) times daily with a meal.   ondansetron 4 MG tablet Commonly known as: Zofran Take 1 tablet (4 mg total) by mouth every 8 (eight) hours as needed for nausea or vomiting.   potassium chloride SA 20 MEQ tablet Commonly known as: KLOR-CON Take 1 tablet (20 mEq total) by mouth daily.   VITAMIN B-12 PO Take 1 tablet by mouth daily.       Allergies: No Known Allergies  Past Medical History, Surgical history, Social history, and Family History were reviewed and updated.  Review of Systems: Review of Systems  Constitutional: Positive for malaise/fatigue.  HENT: Negative.   Eyes: Negative.   Respiratory: Positive for cough.   Cardiovascular: Negative.   Gastrointestinal: Positive for heartburn and nausea.  Genitourinary: Negative.   Musculoskeletal: Negative.   Skin: Negative.   Neurological: Negative.   Endo/Heme/Allergies: Negative.   Psychiatric/Behavioral: Negative.      Physical Exam:  weight is 173 lb (78.5 kg). Her oral  temperature is 97.7 F (36.5 C). Her blood pressure is 125/77 and her pulse is 82. Her respiration is 19 and oxygen saturation is 93%.   Wt Readings from Last 3 Encounters:  04/27/20 173 lb (78.5 kg)  04/08/20 172 lb (78 kg)  03/11/20 178 lb 1 oz (80.8 kg)    Physical Exam Vitals reviewed.  HENT:     Head: Normocephalic and atraumatic.  Eyes:     Pupils: Pupils are equal, round, and reactive to light.  Cardiovascular:     Rate and Rhythm:  Normal rate and regular rhythm.     Heart sounds: Normal heart sounds.  Pulmonary:     Effort: Pulmonary effort is normal.     Breath sounds: Normal breath sounds.  Abdominal:     General: Bowel sounds are normal.     Palpations: Abdomen is soft.  Musculoskeletal:        General: No tenderness or deformity. Normal range of motion.     Cervical back: Normal range of motion.  Lymphadenopathy:     Cervical: No cervical adenopathy.  Skin:    General: Skin is warm and dry.     Findings: No erythema or rash.  Neurological:     Mental Status: She is alert and oriented to person, place, and time.  Psychiatric:        Behavior: Behavior normal.        Thought Content: Thought content normal.        Judgment: Judgment normal.      Lab Results  Component Value Date   WBC 21.7 (H) 04/27/2020   HGB 9.2 (L) 04/27/2020   HCT 28.0 (L) 04/27/2020   MCV 96.2 04/27/2020   PLT 423 (H) 04/27/2020   Lab Results  Component Value Date   FERRITIN 2,125 (H) 04/08/2020   IRON 24 (L) 04/08/2020   TIBC 118 (L) 04/08/2020   UIBC 93 (L) 04/08/2020   IRONPCTSAT 21 04/08/2020   Lab Results  Component Value Date   RETICCTPCT 1.7 04/08/2020   RBC 2.91 (L) 04/27/2020   No results found for: KPAFRELGTCHN, LAMBDASER, KAPLAMBRATIO No results found for: IGGSERUM, IGA, IGMSERUM No results found for: Kathrynn Ducking, MSPIKE, SPEI   Chemistry      Component Value Date/Time   NA 132 (L) 04/27/2020 1400   NA 144  04/26/2017 0830   NA 137 09/30/2016 0940   K 4.1 04/27/2020 1400   K 4.1 04/26/2017 0830   K 4.7 09/30/2016 0940   CL 101 04/27/2020 1400   CL 108 04/26/2017 0830   CO2 24 04/27/2020 1400   CO2 27 04/26/2017 0830   CO2 18 (L) 09/30/2016 0940   BUN 15 04/27/2020 1400   BUN 25 (H) 04/26/2017 0830   BUN 21.5 09/30/2016 0940   CREATININE 1.18 (H) 04/27/2020 1400   CREATININE 1.5 (H) 04/26/2017 0830   CREATININE 1.8 (H) 09/30/2016 0940      Component Value Date/Time   CALCIUM 9.1 04/27/2020 1400   CALCIUM 9.9 04/26/2017 0830   CALCIUM 10.4 09/30/2016 0940   ALKPHOS 315 (H) 04/27/2020 1400   ALKPHOS 124 (H) 04/26/2017 0830   ALKPHOS 147 09/30/2016 0940   AST 22 04/27/2020 1400   AST 27 09/30/2016 0940   ALT 3 04/27/2020 1400   ALT 16 04/26/2017 0830   ALT 11 09/30/2016 0940   BILITOT 0.5 04/27/2020 1400   BILITOT 0.34 09/30/2016 0940       Impression and Plan: Melanie [name] is a very pleasant 85 yo African American female with metastatic adenocarcinoma of the descending colon.  Unfortunately has now progressed.  She has been through pretty much all lines of systemic chemotherapy that we have.  Our goal now is quality of life.  I want to make sure that we focus on her quality of life.  She and her family will talk about end-of-life issues.  Hopefully, Marinol and Hycodan will help with her symptoms.  I think that weight loss will tell us how she is doing.  I  just really hate this for her.  She has tried really hard.  She is done everything we have asked her to do.  She has had a fantastic support from her family.  It is hard to say what timeframe we are looking at for her to go to heaven.  I think we will have a better idea when we see her back.  I would like to see her back in 3 weeks.  Hopefully we can then move her appointments out a little bit longer.     [name], MD 11/29/20214:24 PM

## 2020-04-27 NOTE — Telephone Encounter (Signed)
Dr Marin Olp ordered Hospice for patient after seeing her in the office today. Patient lives in [place], her sister lives locally.  Called patient to see where she will be living to establish hospice care.  She said she will call us with more information once she makes living arrangement plans.

## 2020-04-28 ENCOUNTER — Telehealth: Payer: Self-pay | Admitting: Hematology & Oncology

## 2020-04-28 ENCOUNTER — Telehealth: Payer: Self-pay | Admitting: *Deleted

## 2020-04-28 LAB — FERRITIN: Ferritin: 2416 ng/mL — ABNORMAL HIGH (ref 11–307)

## 2020-04-28 LAB — IRON AND TIBC
Iron: 30 ug/dL — ABNORMAL LOW (ref 41–142)
Saturation Ratios: 34 % (ref 21–57)
TIBC: 90 ug/dL — ABNORMAL LOW (ref 236–444)
UIBC: 60 ug/dL — ABNORMAL LOW (ref 120–384)

## 2020-04-28 LAB — CEA (IN HOUSE-CHCC): CEA (CHCC-In House): 349.79 ng/mL — ABNORMAL HIGH (ref 0.00–5.00)

## 2020-04-28 NOTE — Telephone Encounter (Signed)
Appointments scheduled calendar printed & mailed per 11/29 los

## 2020-04-28 NOTE — Telephone Encounter (Signed)
Called pt, unable to reach . Spoke w/Caroly( sister) gave lab results. Melanie [name] thanked me for the call and verbalized she will give message to pt.no concerns at this time.

## 2020-04-28 NOTE — Telephone Encounter (Signed)
Notified of results

## 2020-04-28 NOTE — Telephone Encounter (Signed)
-----   Message from [name], MD sent at 04/28/2020 12:07 PM EST ----- Call - the CEA tumor marker went from 300 to 350.  Melanie [name]

## 2020-04-28 NOTE — Telephone Encounter (Signed)
-----   Message from [name], MD sent at 04/28/2020 11:04 AM EST ----- Call - the iron level is ok!!  Melanie [name]

## 2020-04-29 ENCOUNTER — Ambulatory Visit: Payer: Medicare Other | Admitting: Hematology & Oncology

## 2020-04-29 ENCOUNTER — Other Ambulatory Visit: Payer: Medicare Other

## 2020-05-01 ENCOUNTER — Telehealth: Payer: Self-pay

## 2020-05-01 NOTE — Telephone Encounter (Signed)
S/w [name] to confirm pts appt on 05/12/20,,, AOM

## 2020-05-12 ENCOUNTER — Inpatient Hospital Stay: Payer: Medicare Other | Attending: Hematology & Oncology

## 2020-05-12 ENCOUNTER — Encounter: Payer: Self-pay | Admitting: Family

## 2020-05-12 ENCOUNTER — Other Ambulatory Visit: Payer: Self-pay

## 2020-05-12 ENCOUNTER — Inpatient Hospital Stay: Payer: Medicare Other

## 2020-05-12 ENCOUNTER — Inpatient Hospital Stay (HOSPITAL_BASED_OUTPATIENT_CLINIC_OR_DEPARTMENT_OTHER): Payer: Medicare Other | Admitting: Family

## 2020-05-12 VITALS — BP 127/65 | HR 77 | Temp 98.5°F | Resp 18 | Ht 63.0 in | Wt 169.1 lb

## 2020-05-12 DIAGNOSIS — C787 Secondary malignant neoplasm of liver and intrahepatic bile duct: Secondary | ICD-10-CM

## 2020-05-12 DIAGNOSIS — D649 Anemia, unspecified: Secondary | ICD-10-CM

## 2020-05-12 DIAGNOSIS — C186 Malignant neoplasm of descending colon: Secondary | ICD-10-CM | POA: Diagnosis present

## 2020-05-12 DIAGNOSIS — R0602 Shortness of breath: Secondary | ICD-10-CM | POA: Insufficient documentation

## 2020-05-12 DIAGNOSIS — D500 Iron deficiency anemia secondary to blood loss (chronic): Secondary | ICD-10-CM

## 2020-05-12 DIAGNOSIS — R197 Diarrhea, unspecified: Secondary | ICD-10-CM | POA: Insufficient documentation

## 2020-05-12 DIAGNOSIS — R5383 Other fatigue: Secondary | ICD-10-CM | POA: Diagnosis not present

## 2020-05-12 DIAGNOSIS — K591 Functional diarrhea: Secondary | ICD-10-CM

## 2020-05-12 DIAGNOSIS — C189 Malignant neoplasm of colon, unspecified: Secondary | ICD-10-CM | POA: Diagnosis not present

## 2020-05-12 DIAGNOSIS — Z79899 Other long term (current) drug therapy: Secondary | ICD-10-CM | POA: Insufficient documentation

## 2020-05-12 LAB — CBC WITH DIFFERENTIAL (CANCER CENTER ONLY)
Abs Immature Granulocytes: 0.56 10*3/uL — ABNORMAL HIGH (ref 0.00–0.07)
Basophils Absolute: 0.1 10*3/uL (ref 0.0–0.1)
Basophils Relative: 0 %
Eosinophils Absolute: 0.1 10*3/uL (ref 0.0–0.5)
Eosinophils Relative: 1 %
HCT: 23.4 % — ABNORMAL LOW (ref 36.0–46.0)
Hemoglobin: 7.8 g/dL — ABNORMAL LOW (ref 12.0–15.0)
Immature Granulocytes: 2 %
Lymphocytes Relative: 4 %
Lymphs Abs: 1 10*3/uL (ref 0.7–4.0)
MCH: 31.7 pg (ref 26.0–34.0)
MCHC: 33.3 g/dL (ref 30.0–36.0)
MCV: 95.1 fL (ref 80.0–100.0)
Monocytes Absolute: 2.4 10*3/uL — ABNORMAL HIGH (ref 0.1–1.0)
Monocytes Relative: 9 %
Neutro Abs: 22.9 10*3/uL — ABNORMAL HIGH (ref 1.7–7.7)
Neutrophils Relative %: 84 %
Platelet Count: 484 10*3/uL — ABNORMAL HIGH (ref 150–400)
RBC: 2.46 MIL/uL — ABNORMAL LOW (ref 3.87–5.11)
RDW: 18.7 % — ABNORMAL HIGH (ref 11.5–15.5)
WBC Count: 27 10*3/uL — ABNORMAL HIGH (ref 4.0–10.5)
nRBC: 0 % (ref 0.0–0.2)

## 2020-05-12 LAB — CMP (CANCER CENTER ONLY)
ALT: 3 U/L (ref 0–44)
AST: 19 U/L (ref 15–41)
Albumin: 2.2 g/dL — ABNORMAL LOW (ref 3.5–5.0)
Alkaline Phosphatase: 306 U/L — ABNORMAL HIGH (ref 38–126)
Anion gap: 7 (ref 5–15)
BUN: 14 mg/dL (ref 8–23)
CO2: 25 mmol/L (ref 22–32)
Calcium: 8.6 mg/dL — ABNORMAL LOW (ref 8.9–10.3)
Chloride: 102 mmol/L (ref 98–111)
Creatinine: 1.08 mg/dL — ABNORMAL HIGH (ref 0.44–1.00)
GFR, Estimated: 51 mL/min — ABNORMAL LOW (ref >60)
Glucose, Bld: 116 mg/dL — ABNORMAL HIGH (ref 70–99)
Potassium: 3.6 mmol/L (ref 3.5–5.1)
Sodium: 134 mmol/L — ABNORMAL LOW (ref 135–145)
Total Bilirubin: 0.5 mg/dL (ref 0.3–1.2)
Total Protein: 5.8 g/dL — ABNORMAL LOW (ref 6.5–8.1)

## 2020-05-12 LAB — SAMPLE TO BLOOD BANK

## 2020-05-12 LAB — PREPARE RBC (CROSSMATCH)

## 2020-05-12 LAB — LACTATE DEHYDROGENASE: LDH: 291 U/L — ABNORMAL HIGH (ref 98–192)

## 2020-05-12 LAB — PREALBUMIN: Prealbumin: <5 mg/dL — ABNORMAL LOW (ref 18–38)

## 2020-05-12 MED ORDER — DIPHENOXYLATE-ATROPINE 2.5-0.025 MG PO TABS: 2.0000 | ORAL_TABLET | Freq: Four times a day (QID) | ORAL | 0 refills | Status: AC | PRN

## 2020-05-12 NOTE — Patient Instructions (Signed)

## 2020-05-12 NOTE — Progress Notes (Signed)
Hematology and Oncology Follow Up Visit  Melanie [name] [id number] 26-Jul-1934 85 y.o. 05/12/2020   Principle Diagnosis:  Metastatic colon cancer -- progressive mets -- NO actionable mutation/LOW TMB/ MSI stable/ KRAS (+)/HER2- --progressive  Past Therapy: FOLFOX q 14 days s/p cycle 3 - DC'd secondary to non-tolerance RFA surgery - January 2019 Xeloda 1500 mg po BID (14/7) -- s/p c#4- start on 11/20/2018 -- d/c on 02/28/2019 Avastin 7.5 mg/kg IV q 3 week -- S/p c#4 -started on 06//23/2020 -- d/c on 02/28/2019 Stivarga 80 mg po q day (14 on/7 off) -- start on 04/24/2019-- d/c on 07/10/2019 due to progression FOLFIRI -- started02/24/2021, s/p cycle #4 -- d/c on 10/23/2019 Lonsurf 60 mg po bid (2 week on/ 2 weeks off) -- start in 11/2019 --DC on 04/27/2020 secondary to progressive disease   Current Therapy: Comfort care  Transfusion support and IV Ironas indicated   Interim History:  Ms. Boultinghouse is here today with her sister for follow-up. She is symptomatic with fatigue and SOB with any exertion. She denies feeling in distress at this time.  Hgb is 7.8, MCV 95, WBC count 27 and platelets 484.  No fever, n/v, cough, rash, dizziness, chest pain, palpitations, abdominal pain or changes in bowel or bladder habits.  She has episodes of diarrhea with certain foods. Taking the Lomotil as needed has helped.  She is cold natured.  She has swelling in her lower extremities that waxes and wanes. Pedal pulses are 2+.  No tenderness, numbness or tingling in her extremities.  No falls or syncope to report.  She states that her appetite comes and goes. She states that she has been taking the Marinol as prescribed. She does feel that she is hydrating well. Her weight is down 4 lbs since her last visit. No episodes of blood loss noted. No bruising or petechiae.   ECOG Performance Status: 1 - Symptomatic but completely ambulatory  Medications:  Allergies as of 05/12/2020    No Known Allergies     Medication List       Accurate as of May 12, 2020  1:34 PM. If you have any questions, ask your nurse or doctor.        amlodipine-atorvastatin 10-10 MG tablet Commonly known as: CADUET Take 1 tablet by mouth daily.   aspirin 81 MG chewable tablet Chew by mouth daily.   CALTRATE 600+D PO Take 1 tablet by mouth at bedtime.   chlorpheniramine-HYDROcodone 10-8 MG/5ML Suer Commonly known as: TUSSIONEX Take 5 mLs by mouth every 12 (twelve) hours as needed for cough.   diphenoxylate-atropine 2.5-0.025 MG tablet Commonly known as: LOMOTIL Take 2 tablets by mouth 4 (four) times daily as needed for diarrhea or loose stools.   dronabinol 2.5 MG capsule Commonly known as: MARINOL Take 1 capsule (2.5 mg total) by mouth 2 (two) times daily before a meal.   HYDROcodone-homatropine 5-1.5 MG/5ML syrup Commonly known as: Hycodan Take 5 mLs by mouth every 6 (six) hours as needed for cough.   levothyroxine 75 MCG tablet Commonly known as: SYNTHROID TAKE 1 TABLET (75 MCG TOTAL) BY MOUTH DAILY BEFORE BREAKFAST.   lidocaine-prilocaine cream Commonly known as: EMLA APPLY TO AFFECTED AREA AS DIRECTED AS NEEDED   Lonsurf 20-8.19 MG tablet Generic drug: trifluridine-tipiracil Take 2 tablets (40 mg of trifluridine total) by mouth 2 (two) times daily after a meal. Take 1 hr after AM & PM meals on days 1-5, 8-12. Repeat every 28day   loperamide 2 MG capsule Commonly known  as: IMODIUM TAKE 2 TABLETS (4 MG TOTAL) BY MOUTH 3 (THREE) TIMES DAILY AS NEEDED. TAKE 2 AT DIARRHEA ONSET , THEN 1 EVERY 2HR UNTIL 12HRS WITH NO BM. MAY TAKE 2 EVERY 4HRS AT NIGHT. IF DIARRHEA RECURS REPEAT.   meloxicam 7.5 MG tablet Commonly known as: Mobic Take 1 tablet (7.5 mg total) by mouth daily. You MUST take with food!!   metoprolol tartrate 25 MG tablet Commonly known as: LOPRESSOR Take 50 mg by mouth 2 (two) times daily.   multivitamin with minerals Tabs tablet Take 1 tablet  by mouth daily.   naproxen 500 MG EC tablet Commonly known as: EC NAPROSYN Take 500 mg by mouth 2 (two) times daily with a meal.   ondansetron 4 MG tablet Commonly known as: Zofran Take 1 tablet (4 mg total) by mouth every 8 (eight) hours as needed for nausea or vomiting.   potassium chloride SA 20 MEQ tablet Commonly known as: KLOR-CON Take 1 tablet (20 mEq total) by mouth daily.   VITAMIN B-12 PO Take 1 tablet by mouth daily.       Allergies: No Known Allergies  Past Medical History, Surgical history, Social history, and Family History were reviewed and updated.  Review of Systems: All other 10 point review of systems is negative.   Physical Exam:  vitals were not taken for this visit.   Wt Readings from Last 3 Encounters:  04/27/20 173 lb (78.5 kg)  04/08/20 172 lb (78 kg)  03/11/20 178 lb 1 oz (80.8 kg)    Ocular: Sclerae unicteric, pupils equal, round and reactive to light Ear-nose-throat: Oropharynx clear, dentition fair Lymphatic: No cervical or supraclavicular adenopathy Lungs no rales or rhonchi, good excursion bilaterally Heart regular rate and rhythm, no murmur appreciated Abd soft, nontender, positive bowel sounds MSK no focal spinal tenderness, no joint edema Neuro: non-focal, well-oriented, appropriate affect Breasts: Deferred   Lab Results  Component Value Date   WBC 21.7 (H) 04/27/2020   HGB 9.2 (L) 04/27/2020   HCT 28.0 (L) 04/27/2020   MCV 96.2 04/27/2020   PLT 423 (H) 04/27/2020   Lab Results  Component Value Date   FERRITIN 2,416 (H) 04/27/2020   IRON 30 (L) 04/27/2020   TIBC 90 (L) 04/27/2020   UIBC 60 (L) 04/27/2020   IRONPCTSAT 34 04/27/2020   Lab Results  Component Value Date   RETICCTPCT 1.7 04/08/2020   RBC 2.91 (L) 04/27/2020   No results found for: KPAFRELGTCHN, LAMBDASER, KAPLAMBRATIO No results found for: IGGSERUM, IGA, IGMSERUM No results found for: Will Bonnet, GAMS, MSPIKE,  SPEI   Chemistry      Component Value Date/Time   NA 132 (L) 04/27/2020 1400   NA 144 04/26/2017 0830   NA 137 09/30/2016 0940   K 4.1 04/27/2020 1400   K 4.1 04/26/2017 0830   K 4.7 09/30/2016 0940   CL 101 04/27/2020 1400   CL 108 04/26/2017 0830   CO2 24 04/27/2020 1400   CO2 27 04/26/2017 0830   CO2 18 (L) 09/30/2016 0940   BUN 15 04/27/2020 1400   BUN 25 (H) 04/26/2017 0830   BUN 21.5 09/30/2016 0940   CREATININE 1.18 (H) 04/27/2020 1400   CREATININE 1.5 (H) 04/26/2017 0830   CREATININE 1.8 (H) 09/30/2016 0940      Component Value Date/Time   CALCIUM 9.1 04/27/2020 1400   CALCIUM 9.9 04/26/2017 0830   CALCIUM 10.4 09/30/2016 0940   ALKPHOS 315 (H) 04/27/2020 1400  ALKPHOS 124 (H) 04/26/2017 0830   ALKPHOS 147 09/30/2016 0940   AST 22 04/27/2020 1400   AST 27 09/30/2016 0940   ALT 3 04/27/2020 1400   ALT 16 04/26/2017 0830   ALT 11 09/30/2016 0940   BILITOT 0.5 04/27/2020 1400   BILITOT 0.34 09/30/2016 0940       Impression and Plan: Ms. Gilcrease is a very pleasant 85yo African American female with metastatic adenocarcinoma of the descending colon. Sadly her disease has progressed and we are now focusing on comfort care.  She is symptomatic as mentioned above with anemia.  We will give her 2 units of blood tomorrow as well as iron if needed.  We will plan to see her again in another 3 weeks.  They would also like to have hospice come in and establish care in early January. We will contact [facility] per their request.  They were encouraged to contact our office with any questions or concerns.  Lomotil refilled.   Laverna Peace, NP 12/14/20211:34 PM

## 2020-05-13 ENCOUNTER — Telehealth: Payer: Self-pay

## 2020-05-13 ENCOUNTER — Inpatient Hospital Stay: Payer: Medicare Other

## 2020-05-13 DIAGNOSIS — C189 Malignant neoplasm of colon, unspecified: Secondary | ICD-10-CM

## 2020-05-13 DIAGNOSIS — C186 Malignant neoplasm of descending colon: Secondary | ICD-10-CM | POA: Diagnosis not present

## 2020-05-13 DIAGNOSIS — D649 Anemia, unspecified: Secondary | ICD-10-CM

## 2020-05-13 LAB — FERRITIN: Ferritin: 1858 ng/mL — ABNORMAL HIGH (ref 11–307)

## 2020-05-13 LAB — CEA (IN HOUSE-CHCC): CEA (CHCC-In House): 325.02 ng/mL — ABNORMAL HIGH (ref 0.00–5.00)

## 2020-05-13 LAB — IRON AND TIBC
Iron: 28 ug/dL — ABNORMAL LOW (ref 41–142)
Saturation Ratios: 39 % (ref 21–57)
TIBC: 71 ug/dL — ABNORMAL LOW (ref 236–444)
UIBC: 44 ug/dL — ABNORMAL LOW (ref 120–384)

## 2020-05-13 MED ORDER — SODIUM CHLORIDE 0.9% FLUSH
10.0000 mL | INTRAVENOUS | Status: AC | PRN
  Administered 2020-05-13: 10 mL
  Filled 2020-05-13: qty 10

## 2020-05-13 MED ORDER — ACETAMINOPHEN 325 MG PO TABS
650.0000 mg | ORAL_TABLET | Freq: Once | ORAL | Status: AC
  Administered 2020-05-13: 650 mg via ORAL

## 2020-05-13 MED ORDER — HEPARIN SOD (PORK) LOCK FLUSH 100 UNIT/ML IV SOLN
250.0000 [IU] | INTRAVENOUS | Status: AC | PRN
  Administered 2020-05-13: 250 [IU]
  Filled 2020-05-13: qty 5

## 2020-05-13 MED ORDER — DIPHENHYDRAMINE HCL 25 MG PO CAPS
25.0000 mg | ORAL_CAPSULE | Freq: Once | ORAL | Status: AC
  Administered 2020-05-13: 25 mg via ORAL

## 2020-05-13 MED ORDER — SODIUM CHLORIDE 0.9% IV SOLUTION
250.0000 mL | Freq: Once | INTRAVENOUS | Status: AC
  Administered 2020-05-13: 250 mL via INTRAVENOUS
  Filled 2020-05-13: qty 250

## 2020-05-13 NOTE — Telephone Encounter (Signed)
06/02/20 appts made per 05/12/20 los, pt will get appts at 05/13/20 blood appt.... AOM

## 2020-05-13 NOTE — Patient Instructions (Signed)

## 2020-05-14 LAB — TYPE AND SCREEN
ABO/RH(D): O POS
Antibody Screen: NEGATIVE
Unit division: 0
Unit division: 0

## 2020-05-14 LAB — BPAM RBC
Blood Product Expiration Date: 202201182359
Blood Product Expiration Date: 202201182359
ISSUE DATE / TIME: 202112150804
ISSUE DATE / TIME: 202112150804
Unit Type and Rh: 5100
Unit Type and Rh: 5100

## 2020-05-18 ENCOUNTER — Other Ambulatory Visit: Payer: Self-pay | Admitting: Hematology & Oncology

## 2020-05-29 ENCOUNTER — Encounter: Payer: Self-pay | Admitting: Family

## 2020-06-02 ENCOUNTER — Inpatient Hospital Stay: Payer: Medicare Other | Admitting: Family

## 2020-06-02 ENCOUNTER — Inpatient Hospital Stay: Payer: Medicare Other

## 2020-06-03 ENCOUNTER — Other Ambulatory Visit: Payer: Self-pay | Admitting: Hematology & Oncology

## 2020-06-03 DIAGNOSIS — C186 Malignant neoplasm of descending colon: Secondary | ICD-10-CM

## 2020-06-03 DIAGNOSIS — D500 Iron deficiency anemia secondary to blood loss (chronic): Secondary | ICD-10-CM

## 2020-06-08 ENCOUNTER — Inpatient Hospital Stay: Payer: Medicare Other

## 2020-06-08 ENCOUNTER — Inpatient Hospital Stay: Payer: Medicare Other | Admitting: Family

## 2020-06-08 ENCOUNTER — Telehealth: Payer: Self-pay | Admitting: Family

## 2020-06-08 NOTE — Telephone Encounter (Signed)
Called and spoke with [name] regarding patient's appointments being rescheduled from 1/10 to 1/11 per 1/10 sch msg

## 2020-06-09 ENCOUNTER — Inpatient Hospital Stay: Payer: Medicare Other

## 2020-06-09 ENCOUNTER — Other Ambulatory Visit: Payer: Self-pay | Admitting: *Deleted

## 2020-06-09 ENCOUNTER — Encounter: Payer: Self-pay | Admitting: Family

## 2020-06-09 ENCOUNTER — Inpatient Hospital Stay: Payer: Medicare Other | Attending: Hematology & Oncology

## 2020-06-09 ENCOUNTER — Other Ambulatory Visit: Payer: Self-pay

## 2020-06-09 ENCOUNTER — Inpatient Hospital Stay (HOSPITAL_BASED_OUTPATIENT_CLINIC_OR_DEPARTMENT_OTHER): Payer: Medicare Other | Admitting: Family

## 2020-06-09 VITALS — BP 89/58 | HR 78 | Temp 98.1°F | Resp 19 | Ht 63.0 in | Wt 168.1 lb

## 2020-06-09 DIAGNOSIS — D649 Anemia, unspecified: Secondary | ICD-10-CM

## 2020-06-09 DIAGNOSIS — C787 Secondary malignant neoplasm of liver and intrahepatic bile duct: Secondary | ICD-10-CM

## 2020-06-09 DIAGNOSIS — C189 Malignant neoplasm of colon, unspecified: Secondary | ICD-10-CM

## 2020-06-09 DIAGNOSIS — R0602 Shortness of breath: Secondary | ICD-10-CM | POA: Diagnosis not present

## 2020-06-09 DIAGNOSIS — R5383 Other fatigue: Secondary | ICD-10-CM | POA: Diagnosis not present

## 2020-06-09 DIAGNOSIS — D500 Iron deficiency anemia secondary to blood loss (chronic): Secondary | ICD-10-CM

## 2020-06-09 DIAGNOSIS — C186 Malignant neoplasm of descending colon: Secondary | ICD-10-CM | POA: Diagnosis present

## 2020-06-09 DIAGNOSIS — Z95828 Presence of other vascular implants and grafts: Secondary | ICD-10-CM

## 2020-06-09 DIAGNOSIS — Z79899 Other long term (current) drug therapy: Secondary | ICD-10-CM | POA: Diagnosis not present

## 2020-06-09 LAB — CMP (CANCER CENTER ONLY)
ALT: 9 U/L (ref 0–44)
AST: 28 U/L (ref 15–41)
Albumin: 1.6 g/dL — ABNORMAL LOW (ref 3.5–5.0)
Alkaline Phosphatase: 298 U/L — ABNORMAL HIGH (ref 38–126)
Anion gap: 7 (ref 5–15)
BUN: 15 mg/dL (ref 8–23)
CO2: 20 mmol/L — ABNORMAL LOW (ref 22–32)
Calcium: 8.2 mg/dL — ABNORMAL LOW (ref 8.9–10.3)
Chloride: 107 mmol/L (ref 98–111)
Creatinine: 1.28 mg/dL — ABNORMAL HIGH (ref 0.44–1.00)
GFR, Estimated: 42 mL/min — ABNORMAL LOW (ref >60)
Glucose, Bld: 112 mg/dL — ABNORMAL HIGH (ref 70–99)
Potassium: 3.7 mmol/L (ref 3.5–5.1)
Sodium: 134 mmol/L — ABNORMAL LOW (ref 135–145)
Total Bilirubin: 0.8 mg/dL (ref 0.3–1.2)
Total Protein: 5.7 g/dL — ABNORMAL LOW (ref 6.5–8.1)

## 2020-06-09 LAB — CBC WITH DIFFERENTIAL (CANCER CENTER ONLY)
Abs Immature Granulocytes: 0.37 10*3/uL — ABNORMAL HIGH (ref 0.00–0.07)
Basophils Absolute: 0.1 10*3/uL (ref 0.0–0.1)
Basophils Relative: 0 %
Eosinophils Absolute: 0.1 10*3/uL (ref 0.0–0.5)
Eosinophils Relative: 0 %
HCT: 21.2 % — ABNORMAL LOW (ref 36.0–46.0)
Hemoglobin: 7.2 g/dL — ABNORMAL LOW (ref 12.0–15.0)
Immature Granulocytes: 1 %
Lymphocytes Relative: 3 %
Lymphs Abs: 0.8 10*3/uL (ref 0.7–4.0)
MCH: 30.6 pg (ref 26.0–34.0)
MCHC: 34 g/dL (ref 30.0–36.0)
MCV: 90.2 fL (ref 80.0–100.0)
Monocytes Absolute: 1.7 10*3/uL — ABNORMAL HIGH (ref 0.1–1.0)
Monocytes Relative: 6 %
Neutro Abs: 24.4 10*3/uL — ABNORMAL HIGH (ref 1.7–7.7)
Neutrophils Relative %: 90 %
Platelet Count: 404 10*3/uL — ABNORMAL HIGH (ref 150–400)
RBC: 2.35 MIL/uL — ABNORMAL LOW (ref 3.87–5.11)
RDW: 17.8 % — ABNORMAL HIGH (ref 11.5–15.5)
WBC Count: 27.5 10*3/uL — ABNORMAL HIGH (ref 4.0–10.5)
nRBC: 0 % (ref 0.0–0.2)

## 2020-06-09 LAB — PREPARE RBC (CROSSMATCH)

## 2020-06-09 LAB — SAMPLE TO BLOOD BANK

## 2020-06-09 LAB — PREALBUMIN: Prealbumin: <5 mg/dL — ABNORMAL LOW (ref 18–38)

## 2020-06-09 LAB — LACTATE DEHYDROGENASE: LDH: 196 U/L — ABNORMAL HIGH (ref 98–192)

## 2020-06-09 MED ORDER — HEPARIN SOD (PORK) LOCK FLUSH 100 UNIT/ML IV SOLN
500.0000 [IU] | Freq: Once | INTRAVENOUS | Status: AC
  Administered 2020-06-09: 500 [IU] via INTRAVENOUS
  Filled 2020-06-09: qty 5

## 2020-06-09 MED ORDER — SODIUM CHLORIDE 0.9% FLUSH
10.0000 mL | Freq: Once | INTRAVENOUS | Status: AC
  Administered 2020-06-09: 10 mL via INTRAVENOUS
  Filled 2020-06-09: qty 10

## 2020-06-09 NOTE — Progress Notes (Signed)
Hematology and Oncology Follow Up Visit  Melanie [name] [id number] June 07, 1934 86 y.o. 06/09/2020   Principle Diagnosis:  Metastatic colon cancer -- progressive mets -- NO actionable mutation/LOW TMB/ MSI stable/ KRAS (+)/HER2---progressive  Past Therapy: FOLFOX q 14 days s/p cycle 3 - DC'd secondary to non-tolerance RFA surgery - January 2019 Xeloda 1500 mg po BID (14/7) -- s/p c#4- start on 11/20/2018 -- d/c on 02/28/2019 Avastin 7.5 mg/kg IV q 3 week -- S/p c#4 -started on 06//23/2020 -- d/c on 02/28/2019 Stivarga 80 mg po q day (14 on/7 off) -- start on 04/24/2019-- d/c on 07/10/2019 due to progression FOLFIRI -- started02/24/2021, s/p cycle #4 -- d/c on 10/23/2019 Lonsurf 60 mg po bid (2 week on/ 2 weeks off) -- start in 11/2019--DC on 04/27/2020 secondary to progressive disease  Current Therapy: Comfort care  Transfusion support and IV Ironas indicated   Interim History:  Melanie [name] is here today with her sister for follow-up. She is symptomatic with fatigue and mild SOB with exertion. She is also quite weak unable to get up without assistance.  She is still wanting to hold off on hospice. She states that her brother helps her at her home in [facility] and that her sister comes to get her for appointments and takes care of her here. They states that they will let us know when she is ready.  No fever, chills, n/v, cough, rash, dizziness, chest pain, palpitations, abdominal pain or changes in bowel or bladder habits.  She has swelling in both of her lower extremities. No redness or broken skin. 2+ pitting edema in her ankles and pedal pulses are 2+. She denies pain or tenderness.  No numbness or tingling in her extremities.  She states that her appetite comes and goes but she does feel that she is staying well hydrated. Her weight is stable at 168 lbs.   ECOG Performance Status: 2 - Symptomatic, <50% confined to bed  Medications:  Allergies as of  06/09/2020   No Known Allergies     Medication List       Accurate as of June 09, 2020 10:50 AM. If you have any questions, ask your nurse or doctor.        amlodipine-atorvastatin 10-10 MG tablet Commonly known as: CADUET Take 1 tablet by mouth daily.   aspirin 81 MG chewable tablet Chew by mouth daily.   CALTRATE 600+D PO Take 1 tablet by mouth at bedtime.   chlorpheniramine-HYDROcodone 10-8 MG/5ML Suer Commonly known as: TUSSIONEX Take 5 mLs by mouth every 12 (twelve) hours as needed for cough.   diphenoxylate-atropine 2.5-0.025 MG tablet Commonly known as: LOMOTIL Take 2 tablets by mouth 4 (four) times daily as needed for diarrhea or loose stools.   dronabinol 2.5 MG capsule Commonly known as: MARINOL Take 1 capsule (2.5 mg total) by mouth 2 (two) times daily before a meal.   HYDROcodone-homatropine 5-1.5 MG/5ML syrup Commonly known as: Hycodan Take 5 mLs by mouth every 6 (six) hours as needed for cough.   levothyroxine 75 MCG tablet Commonly known as: SYNTHROID TAKE 1 TABLET BY MOUTH DAILY BEFORE BREAKFAST.   lidocaine-prilocaine cream Commonly known as: EMLA APPLY TO AFFECTED AREA AS DIRECTED AS NEEDED   loperamide 2 MG capsule Commonly known as: IMODIUM TAKE 2 TABLETS (4 MG TOTAL) BY MOUTH 3 (THREE) TIMES DAILY AS NEEDED. TAKE 2 AT DIARRHEA ONSET , THEN 1 EVERY 2HR UNTIL 12HRS WITH NO BM. MAY TAKE 2 EVERY 4HRS AT NIGHT. IF DIARRHEA  RECURS REPEAT.   meloxicam 7.5 MG tablet Commonly known as: Mobic Take 1 tablet (7.5 mg total) by mouth daily. You MUST take with food!!   metoprolol tartrate 25 MG tablet Commonly known as: LOPRESSOR Take 50 mg by mouth 2 (two) times daily.   multivitamin with minerals Tabs tablet Take 1 tablet by mouth daily.   naproxen 500 MG EC tablet Commonly known as: EC NAPROSYN Take 500 mg by mouth 2 (two) times daily with a meal.   ondansetron 4 MG tablet Commonly known as: Zofran Take 1 tablet (4 mg total) by mouth every  8 (eight) hours as needed for nausea or vomiting.   potassium chloride SA 20 MEQ tablet Commonly known as: KLOR-CON Take 1 tablet (20 mEq total) by mouth daily.   VITAMIN B-12 PO Take 1 tablet by mouth daily.       Allergies: No Known Allergies  Past Medical History, Surgical history, Social history, and Family History were reviewed and updated.  Review of Systems: All other 10 point review of systems is negative.   Physical Exam:  vitals were not taken for this visit.   Wt Readings from Last 3 Encounters:  05/12/20 169 lb 1.9 oz (76.7 kg)  04/27/20 173 lb (78.5 kg)  04/08/20 172 lb (78 kg)    Ocular: Sclerae unicteric, pupils equal, round and reactive to light Ear-nose-throat: Oropharynx clear, dentition fair Lymphatic: No cervical or supraclavicular adenopathy Lungs no rales or rhonchi, good excursion bilaterally Heart regular rate and rhythm, no murmur appreciated Abd soft, nontender, positive bowel sounds MSK no focal spinal tenderness, no joint edema Neuro: non-focal, well-oriented, appropriate affect Breasts: Deferred   Lab Results  Component Value Date   WBC 27.0 (H) 05/12/2020   HGB 7.8 (L) 05/12/2020   HCT 23.4 (L) 05/12/2020   MCV 95.1 05/12/2020   PLT 484 (H) 05/12/2020   Lab Results  Component Value Date   FERRITIN 1,858 (H) 05/12/2020   IRON 28 (L) 05/12/2020   TIBC 71 (L) 05/12/2020   UIBC 44 (L) 05/12/2020   IRONPCTSAT 39 05/12/2020   Lab Results  Component Value Date   RETICCTPCT 1.7 04/08/2020   RBC 2.46 (L) 05/12/2020   No results found for: KPAFRELGTCHN, LAMBDASER, KAPLAMBRATIO No results found for: IGGSERUM, IGA, IGMSERUM No results found for: Kathrynn Ducking, MSPIKE, SPEI   Chemistry      Component Value Date/Time   NA 134 (L) 05/12/2020 1318   NA 144 04/26/2017 0830   NA 137 09/30/2016 0940   K 3.6 05/12/2020 1318   K 4.1 04/26/2017 0830   K 4.7 09/30/2016 0940   CL 102 05/12/2020  1318   CL 108 04/26/2017 0830   CO2 25 05/12/2020 1318   CO2 27 04/26/2017 0830   CO2 18 (L) 09/30/2016 0940   BUN 14 05/12/2020 1318   BUN 25 (H) 04/26/2017 0830   BUN 21.5 09/30/2016 0940   CREATININE 1.08 (H) 05/12/2020 1318   CREATININE 1.5 (H) 04/26/2017 0830   CREATININE 1.8 (H) 09/30/2016 0940      Component Value Date/Time   CALCIUM 8.6 (L) 05/12/2020 1318   CALCIUM 9.9 04/26/2017 0830   CALCIUM 10.4 09/30/2016 0940   ALKPHOS 306 (H) 05/12/2020 1318   ALKPHOS 124 (H) 04/26/2017 0830   ALKPHOS 147 09/30/2016 0940   AST 19 05/12/2020 1318   AST 27 09/30/2016 0940   ALT 3 05/12/2020 1318   ALT 16 04/26/2017 0830   ALT 11  09/30/2016 0940   BILITOT 0.5 05/12/2020 1318   BILITOT 0.34 09/30/2016 0940       Impression and Plan: Ms. Ziesmer is a very pleasant 86yo African American female withmetastaticadenocarcinoma of the descending colon. Sadly her disease has progressed and we are now focusing on comfort care.  She is symptomatic with anemia as mentioned above.  We will get her set up for 2 units of blood tomorrow. There is a Producer, television/film/video of blood with the [employer] so she may only be able to get one unit. They are very understanding about this.  Iron studies are pending. We will replace if needed.  Follow-up in 3 weeks. They were encouraged to contact our office with any questions or concerns.   Laverna Peace, NP 1/11/202210:50 AM

## 2020-06-09 NOTE — Patient Instructions (Signed)

## 2020-06-10 ENCOUNTER — Inpatient Hospital Stay: Payer: Medicare Other

## 2020-06-10 ENCOUNTER — Telehealth: Payer: Self-pay

## 2020-06-10 ENCOUNTER — Ambulatory Visit: Payer: Medicare Other

## 2020-06-10 ENCOUNTER — Other Ambulatory Visit: Payer: Self-pay | Admitting: Family

## 2020-06-10 VITALS — BP 119/62 | HR 67 | Temp 97.7°F | Resp 17

## 2020-06-10 DIAGNOSIS — C186 Malignant neoplasm of descending colon: Secondary | ICD-10-CM | POA: Diagnosis not present

## 2020-06-10 DIAGNOSIS — D500 Iron deficiency anemia secondary to blood loss (chronic): Secondary | ICD-10-CM

## 2020-06-10 DIAGNOSIS — C787 Secondary malignant neoplasm of liver and intrahepatic bile duct: Secondary | ICD-10-CM

## 2020-06-10 DIAGNOSIS — C189 Malignant neoplasm of colon, unspecified: Secondary | ICD-10-CM

## 2020-06-10 DIAGNOSIS — D649 Anemia, unspecified: Secondary | ICD-10-CM

## 2020-06-10 LAB — IRON AND TIBC
Iron: 16 ug/dL — ABNORMAL LOW (ref 41–142)
Saturation Ratios: 29 % (ref 21–57)
TIBC: 54 ug/dL — ABNORMAL LOW (ref 236–444)
UIBC: 38 ug/dL — ABNORMAL LOW (ref 120–384)

## 2020-06-10 LAB — CEA (IN HOUSE-CHCC): CEA (CHCC-In House): 243.92 ng/mL — ABNORMAL HIGH (ref 0.00–5.00)

## 2020-06-10 LAB — FERRITIN: Ferritin: 1126 ng/mL — ABNORMAL HIGH (ref 11–307)

## 2020-06-10 MED ORDER — FUROSEMIDE 10 MG/ML IJ SOLN: 20.0000 mg | Freq: Once | INTRAMUSCULAR | Status: DC

## 2020-06-10 MED ORDER — HEPARIN SOD (PORK) LOCK FLUSH 100 UNIT/ML IV SOLN
500.0000 [IU] | Freq: Every day | INTRAVENOUS | Status: AC | PRN
  Administered 2020-06-10: 500 [IU]
  Filled 2020-06-10: qty 5

## 2020-06-10 MED ORDER — ACETAMINOPHEN 325 MG PO TABS
ORAL_TABLET | ORAL | Status: AC
  Filled 2020-06-10: qty 2

## 2020-06-10 MED ORDER — SODIUM CHLORIDE 0.9 % IV SOLN
INTRAVENOUS | Status: DC
  Filled 2020-06-10: qty 250

## 2020-06-10 MED ORDER — SODIUM CHLORIDE 0.9 % IV SOLN
510.0000 mg | Freq: Once | INTRAVENOUS | Status: AC
  Administered 2020-06-10: 510 mg via INTRAVENOUS
  Filled 2020-06-10: qty 17

## 2020-06-10 MED ORDER — ACETAMINOPHEN 325 MG PO TABS
650.0000 mg | ORAL_TABLET | Freq: Once | ORAL | Status: AC
  Administered 2020-06-10: 650 mg via ORAL

## 2020-06-10 MED ORDER — DIPHENHYDRAMINE HCL 25 MG PO CAPS
25.0000 mg | ORAL_CAPSULE | Freq: Once | ORAL | Status: AC
  Administered 2020-06-10: 25 mg via ORAL

## 2020-06-10 MED ORDER — DIPHENHYDRAMINE HCL 25 MG PO CAPS
ORAL_CAPSULE | ORAL | Status: AC
  Filled 2020-06-10: qty 1

## 2020-06-10 MED ORDER — SODIUM CHLORIDE 0.9% FLUSH
10.0000 mL | INTRAVENOUS | Status: AC | PRN
  Administered 2020-06-10: 10 mL
  Filled 2020-06-10: qty 10

## 2020-06-10 MED ORDER — SODIUM CHLORIDE 0.9% IV SOLUTION
250.0000 mL | Freq: Once | INTRAVENOUS | Status: AC
  Administered 2020-06-10: 250 mL via INTRAVENOUS
  Filled 2020-06-10: qty 250

## 2020-06-10 NOTE — Progress Notes (Signed)
Pt declined to stay for post infusion observation period. Pt stated she has tolerated medication multiple times prior without difficulty. Pt aware to call clinic with any questions or concerns. Pt verbalized understanding and had no further questions.  ? ?

## 2020-06-10 NOTE — Patient Instructions (Signed)
Blood Transfusion, Adult, Care After This sheet gives you information about how to care for yourself after your procedure. Your doctor may also give you more specific instructions. If you have problems or questions, contact your doctor. What can I expect after the procedure? After the procedure, it is common to have:  Bruising and soreness at the IV site.  A fever or chills on the day of the procedure. This may be your body's response to the new blood cells received.  A headache. Follow these instructions at home: Insertion site care  Follow instructions from your doctor about how to take care of your insertion site. This is where an IV tube was put into your vein. Make sure you: ? Wash your hands with soap and water before and after you change your bandage (dressing). If you cannot use soap and water, use hand sanitizer. ? Change your bandage as told by your doctor.  Check your insertion site every day for signs of infection. Check for: ? Redness, swelling, or pain. ? Bleeding from the site. ? Warmth. ? Pus or a bad smell.      General instructions  Take over-the-counter and prescription medicines only as told by your doctor.  Rest as told by your doctor.  Go back to your normal activities as told by your doctor.  Keep all follow-up visits as told by your doctor. This is important. Contact a doctor if:  You have itching or red, swollen areas of skin (hives).  You feel worried or nervous (anxious).  You feel weak after doing your normal activities.  You have redness, swelling, warmth, or pain around the insertion site.  You have blood coming from the insertion site, and the blood does not stop with pressure.  You have pus or a bad smell coming from the insertion site. Get help right away if:  You have signs of a serious reaction. This may be coming from an allergy or the body's defense system (immune system). Signs include: ? Trouble breathing or shortness of  breath. ? Swelling of the face or feeling warm (flushed). ? Fever or chills. ? Head, chest, or back pain. ? Dark pee (urine) or blood in the pee. ? Widespread rash. ? Fast heartbeat. ? Feeling dizzy or light-headed. You may receive your blood transfusion in an outpatient setting. If so, you will be told whom to contact to report any reactions. These symptoms may be an emergency. Do not wait to see if the symptoms will go away. Get medical help right away. Call your local emergency services (911 in the [place] Do not drive yourself to the hospital. Summary  Bruising and soreness at the IV site are common.  Check your insertion site every day for signs of infection.  Rest as told by your doctor. Go back to your normal activities as told by your doctor.  Get help right away if you have signs of a serious reaction. This information is not intended to replace advice given to you by your health care provider. Make sure you discuss any questions you have with your health care provider. Document Revised: 11/08/2018 Document Reviewed: 11/08/2018 Elsevier Patient Education  2021 Elsevier Inc. Iron Sucrose injection What is this medicine? IRON SUCROSE (AHY ern SOO krohs) is an iron complex. Iron is used to make healthy red blood cells, which carry oxygen and nutrients throughout the body. This medicine is used to treat iron deficiency anemia in people with chronic kidney disease. This medicine may be used for   other purposes; ask your health care provider or pharmacist if you have questions. COMMON BRAND NAME(S): Venofer What should I tell my health care provider before I take this medicine? They need to know if you have any of these conditions:  anemia not caused by low iron levels  heart disease  high levels of iron in the blood  kidney disease  liver disease  an unusual or allergic reaction to iron, other medicines, foods, dyes, or preservatives  pregnant or trying to get  pregnant  breast-feeding How should I use this medicine? This medicine is for infusion into a vein. It is given by a health care professional in a hospital or clinic setting. Talk to your pediatrician regarding the use of this medicine in children. While this drug may be prescribed for children as young as 2 years for selected conditions, precautions do apply. Overdosage: If you think you have taken too much of this medicine contact a poison control center or emergency room at once. NOTE: This medicine is only for you. Do not share this medicine with others. What if I miss a dose? It is important not to miss your dose. Call your doctor or health care professional if you are unable to keep an appointment. What may interact with this medicine? Do not take this medicine with any of the following medications:  deferoxamine  dimercaprol  other iron products This medicine may also interact with the following medications:  chloramphenicol  deferasirox This list may not describe all possible interactions. Give your health care provider a list of all the medicines, herbs, non-prescription drugs, or dietary supplements you use. Also tell them if you smoke, drink alcohol, or use illegal drugs. Some items may interact with your medicine. What should I watch for while using this medicine? Visit your doctor or healthcare professional regularly. Tell your doctor or healthcare professional if your symptoms do not start to get better or if they get worse. You may need blood work done while you are taking this medicine. You may need to follow a special diet. Talk to your doctor. Foods that contain iron include: whole grains/cereals, dried fruits, beans, or peas, leafy green vegetables, and organ meats (liver, kidney). What side effects may I notice from receiving this medicine? Side effects that you should report to your doctor or health care professional as soon as possible:  allergic reactions like  skin rash, itching or hives, swelling of the face, lips, or tongue  breathing problems  changes in blood pressure  cough  fast, irregular heartbeat  feeling faint or lightheaded, falls  fever or chills  flushing, sweating, or hot feelings  joint or muscle aches/pains  seizures  swelling of the ankles or feet  unusually weak or tired Side effects that usually do not require medical attention (report to your doctor or health care professional if they continue or are bothersome):  diarrhea  feeling achy  headache  irritation at site where injected  nausea, vomiting  stomach upset  tiredness This list may not describe all possible side effects. Call your doctor for medical advice about side effects. You may report side effects to FDA at 1-800-FDA-1088. Where should I keep my medicine? This drug is given in a hospital or clinic and will not be stored at home. NOTE: This sheet is a summary. It may not cover all possible information. If you have questions about this medicine, talk to your doctor, pharmacist, or health care provider.  2021 Elsevier/Gold Standard (2011-02-24 17:14:35)  

## 2020-06-10 NOTE — Telephone Encounter (Signed)
Per secure chat pt was added on for iv iron following her blood appt and also added on for next week, pt will rec sch in tx/avs      aom

## 2020-06-11 LAB — TYPE AND SCREEN
ABO/RH(D): O POS
Antibody Screen: NEGATIVE
Unit division: 0
Unit division: 0

## 2020-06-11 LAB — BPAM RBC
Blood Product Expiration Date: 202202122359
Blood Product Expiration Date: 202202132359
ISSUE DATE / TIME: 202201120739
ISSUE DATE / TIME: 202201120739
Unit Type and Rh: 5100
Unit Type and Rh: 5100

## 2020-06-18 ENCOUNTER — Other Ambulatory Visit: Payer: Self-pay

## 2020-06-18 ENCOUNTER — Inpatient Hospital Stay: Payer: Medicare Other

## 2020-06-18 VITALS — BP 105/70 | HR 70 | Resp 17

## 2020-06-18 DIAGNOSIS — C186 Malignant neoplasm of descending colon: Secondary | ICD-10-CM | POA: Diagnosis not present

## 2020-06-18 DIAGNOSIS — D500 Iron deficiency anemia secondary to blood loss (chronic): Secondary | ICD-10-CM

## 2020-06-18 MED ORDER — SODIUM CHLORIDE 0.9 % IV SOLN
Freq: Once | INTRAVENOUS | Status: AC
  Filled 2020-06-18: qty 250

## 2020-06-18 MED ORDER — SODIUM CHLORIDE 0.9 % IV SOLN
510.0000 mg | Freq: Once | INTRAVENOUS | Status: AC
  Administered 2020-06-18: 510 mg via INTRAVENOUS
  Filled 2020-06-18: qty 17

## 2020-06-18 MED ORDER — SODIUM CHLORIDE 0.9% FLUSH
10.0000 mL | INTRAVENOUS | Status: DC | PRN
  Filled 2020-06-18: qty 10

## 2020-06-18 MED ORDER — HEPARIN SOD (PORK) LOCK FLUSH 100 UNIT/ML IV SOLN
500.0000 [IU] | Freq: Once | INTRAVENOUS | Status: DC
  Filled 2020-06-18: qty 5

## 2020-06-18 NOTE — Patient Instructions (Signed)
Ferumoxytol injection What is this medicine? FERUMOXYTOL is an iron complex. Iron is used to make healthy red blood cells, which carry oxygen and nutrients throughout the body. This medicine is used to treat iron deficiency anemia. This medicine may be used for other purposes; ask your health care provider or pharmacist if you have questions. COMMON BRAND NAME(S): Feraheme What should I tell my health care provider before I take this medicine? They need to know if you have any of these conditions:  anemia not caused by low iron levels  high levels of iron in the blood  magnetic resonance imaging (MRI) test scheduled  an unusual or allergic reaction to iron, other medicines, foods, dyes, or preservatives  pregnant or trying to get pregnant  breast-feeding How should I use this medicine? This medicine is for injection into a vein. It is given by a health care professional in a hospital or clinic setting. Talk to your pediatrician regarding the use of this medicine in children. Special care may be needed. Overdosage: If you think you have taken too much of this medicine contact a poison control center or emergency room at once. NOTE: This medicine is only for you. Do not share this medicine with others. What if I miss a dose? It is important not to miss your dose. Call your doctor or health care professional if you are unable to keep an appointment. What may interact with this medicine? This medicine may interact with the following medications:  other iron products This list may not describe all possible interactions. Give your health care provider a list of all the medicines, herbs, non-prescription drugs, or dietary supplements you use. Also tell them if you smoke, drink alcohol, or use illegal drugs. Some items may interact with your medicine. What should I watch for while using this medicine? Visit your doctor or healthcare professional regularly. Tell your doctor or healthcare  professional if your symptoms do not start to get better or if they get worse. You may need blood work done while you are taking this medicine. You may need to follow a special diet. Talk to your doctor. Foods that contain iron include: whole grains/cereals, dried fruits, beans, or peas, leafy green vegetables, and organ meats (liver, kidney). What side effects may I notice from receiving this medicine? Side effects that you should report to your doctor or health care professional as soon as possible:  allergic reactions like skin rash, itching or hives, swelling of the face, lips, or tongue  breathing problems  changes in blood pressure  feeling faint or lightheaded, falls  fever or chills  flushing, sweating, or hot feelings  swelling of the ankles or feet Side effects that usually do not require medical attention (report to your doctor or health care professional if they continue or are bothersome):  diarrhea  headache  nausea, vomiting  stomach pain This list may not describe all possible side effects. Call your doctor for medical advice about side effects. You may report side effects to FDA at 1-800-FDA-1088. Where should I keep my medicine? This drug is given in a hospital or clinic and will not be stored at home. NOTE: This sheet is a summary. It may not cover all possible information. If you have questions about this medicine, talk to your doctor, pharmacist, or health care provider.  2021 Elsevier/Gold Standard (2016-07-04 20:21:10)  

## 2020-06-22 ENCOUNTER — Encounter: Payer: Self-pay | Admitting: Hematology & Oncology

## 2020-06-22 ENCOUNTER — Other Ambulatory Visit: Payer: Self-pay | Admitting: Hematology & Oncology

## 2020-06-23 ENCOUNTER — Other Ambulatory Visit: Payer: Self-pay | Admitting: Family

## 2020-06-23 DIAGNOSIS — R1084 Generalized abdominal pain: Secondary | ICD-10-CM

## 2020-06-23 DIAGNOSIS — C787 Secondary malignant neoplasm of liver and intrahepatic bile duct: Secondary | ICD-10-CM

## 2020-06-23 DIAGNOSIS — C189 Malignant neoplasm of colon, unspecified: Secondary | ICD-10-CM

## 2020-06-23 MED ORDER — MORPHINE SULFATE (CONCENTRATE) 10 MG /0.5 ML PO SOLN: 5.0000 mg | ORAL | 0 refills | Status: AC | PRN

## 2020-06-23 NOTE — Progress Notes (Signed)
Med list updated per MD orders.

## 2020-06-23 NOTE — Progress Notes (Signed)
Roxanol 5-10 mg Q2H PRN for abdominal pain per MD.

## 2020-06-25 ENCOUNTER — Encounter: Payer: Self-pay | Admitting: Family

## 2020-06-29 ENCOUNTER — Telehealth: Payer: Self-pay | Admitting: *Deleted

## 2020-06-29 NOTE — Telephone Encounter (Signed)
Referral called to [facility] per order of S. [place] NP.  Pt.'s sister [name] aware and is agreeable to [facility].

## 2020-06-30 ENCOUNTER — Encounter: Payer: Self-pay | Admitting: Family

## 2020-06-30 ENCOUNTER — Other Ambulatory Visit: Payer: Self-pay | Admitting: *Deleted

## 2020-06-30 ENCOUNTER — Inpatient Hospital Stay: Payer: Medicare Other

## 2020-06-30 ENCOUNTER — Other Ambulatory Visit: Payer: Self-pay

## 2020-06-30 ENCOUNTER — Inpatient Hospital Stay: Payer: Medicare Other | Attending: Hematology & Oncology

## 2020-06-30 ENCOUNTER — Inpatient Hospital Stay (HOSPITAL_BASED_OUTPATIENT_CLINIC_OR_DEPARTMENT_OTHER): Payer: Medicare Other | Admitting: Family

## 2020-06-30 VITALS — BP 93/55 | HR 88 | Temp 98.4°F | Resp 22 | Ht 63.0 in

## 2020-06-30 DIAGNOSIS — C787 Secondary malignant neoplasm of liver and intrahepatic bile duct: Secondary | ICD-10-CM

## 2020-06-30 DIAGNOSIS — Z515 Encounter for palliative care: Secondary | ICD-10-CM | POA: Diagnosis not present

## 2020-06-30 DIAGNOSIS — C186 Malignant neoplasm of descending colon: Secondary | ICD-10-CM

## 2020-06-30 DIAGNOSIS — R197 Diarrhea, unspecified: Secondary | ICD-10-CM | POA: Insufficient documentation

## 2020-06-30 DIAGNOSIS — R5383 Other fatigue: Secondary | ICD-10-CM | POA: Insufficient documentation

## 2020-06-30 DIAGNOSIS — R609 Edema, unspecified: Secondary | ICD-10-CM | POA: Insufficient documentation

## 2020-06-30 DIAGNOSIS — R531 Weakness: Secondary | ICD-10-CM | POA: Insufficient documentation

## 2020-06-30 DIAGNOSIS — R0602 Shortness of breath: Secondary | ICD-10-CM | POA: Diagnosis not present

## 2020-06-30 DIAGNOSIS — M7989 Other specified soft tissue disorders: Secondary | ICD-10-CM | POA: Diagnosis not present

## 2020-06-30 DIAGNOSIS — C189 Malignant neoplasm of colon, unspecified: Secondary | ICD-10-CM

## 2020-06-30 DIAGNOSIS — D649 Anemia, unspecified: Secondary | ICD-10-CM

## 2020-06-30 DIAGNOSIS — Z79899 Other long term (current) drug therapy: Secondary | ICD-10-CM | POA: Diagnosis not present

## 2020-06-30 DIAGNOSIS — E032 Hypothyroidism due to medicaments and other exogenous substances: Secondary | ICD-10-CM

## 2020-06-30 DIAGNOSIS — R059 Cough, unspecified: Secondary | ICD-10-CM | POA: Diagnosis not present

## 2020-06-30 DIAGNOSIS — Z95828 Presence of other vascular implants and grafts: Secondary | ICD-10-CM

## 2020-06-30 DIAGNOSIS — D500 Iron deficiency anemia secondary to blood loss (chronic): Secondary | ICD-10-CM

## 2020-06-30 LAB — CMP (CANCER CENTER ONLY)
ALT: 3 U/L (ref 0–44)
AST: 10 U/L — ABNORMAL LOW (ref 15–41)
Albumin: 1.9 g/dL — ABNORMAL LOW (ref 3.5–5.0)
Alkaline Phosphatase: 232 U/L — ABNORMAL HIGH (ref 38–126)
Anion gap: 10 (ref 5–15)
BUN: 22 mg/dL (ref 8–23)
CO2: 22 mmol/L (ref 22–32)
Calcium: 9 mg/dL (ref 8.9–10.3)
Chloride: 107 mmol/L (ref 98–111)
Creatinine: 1.26 mg/dL — ABNORMAL HIGH (ref 0.44–1.00)
GFR, Estimated: 42 mL/min — ABNORMAL LOW (ref >60)
Glucose, Bld: 119 mg/dL — ABNORMAL HIGH (ref 70–99)
Potassium: 3.5 mmol/L (ref 3.5–5.1)
Sodium: 139 mmol/L (ref 135–145)
Total Bilirubin: 1 mg/dL (ref 0.3–1.2)
Total Protein: 5.6 g/dL — ABNORMAL LOW (ref 6.5–8.1)

## 2020-06-30 LAB — CBC WITH DIFFERENTIAL (CANCER CENTER ONLY)
Abs Immature Granulocytes: 1.24 10*3/uL — ABNORMAL HIGH (ref 0.00–0.07)
Basophils Absolute: 0.1 10*3/uL (ref 0.0–0.1)
Basophils Relative: 0 %
Eosinophils Absolute: 0 10*3/uL (ref 0.0–0.5)
Eosinophils Relative: 0 %
HCT: 25 % — ABNORMAL LOW (ref 36.0–46.0)
Hemoglobin: 8.6 g/dL — ABNORMAL LOW (ref 12.0–15.0)
Immature Granulocytes: 4 %
Lymphocytes Relative: 2 %
Lymphs Abs: 0.5 10*3/uL — ABNORMAL LOW (ref 0.7–4.0)
MCH: 29.9 pg (ref 26.0–34.0)
MCHC: 34.4 g/dL (ref 30.0–36.0)
MCV: 86.8 fL (ref 80.0–100.0)
Monocytes Absolute: 1.3 10*3/uL — ABNORMAL HIGH (ref 0.1–1.0)
Monocytes Relative: 4 %
Neutro Abs: 28.1 10*3/uL — ABNORMAL HIGH (ref 1.7–7.7)
Neutrophils Relative %: 90 %
Platelet Count: 275 10*3/uL (ref 150–400)
RBC: 2.88 MIL/uL — ABNORMAL LOW (ref 3.87–5.11)
RDW: 18.1 % — ABNORMAL HIGH (ref 11.5–15.5)
WBC Count: 31.3 10*3/uL — ABNORMAL HIGH (ref 4.0–10.5)
nRBC: 0.1 % (ref 0.0–0.2)

## 2020-06-30 LAB — PREPARE RBC (CROSSMATCH)

## 2020-06-30 LAB — SAMPLE TO BLOOD BANK

## 2020-06-30 LAB — LACTATE DEHYDROGENASE: LDH: 186 U/L (ref 98–192)

## 2020-06-30 MED ORDER — SODIUM CHLORIDE 0.9% FLUSH
10.0000 mL | Freq: Once | INTRAVENOUS | Status: AC
  Administered 2020-06-30: 10 mL via INTRAVENOUS
  Filled 2020-06-30: qty 10

## 2020-06-30 MED ORDER — HEPARIN SOD (PORK) LOCK FLUSH 100 UNIT/ML IV SOLN
500.0000 [IU] | Freq: Once | INTRAVENOUS | Status: AC
  Administered 2020-06-30: 500 [IU] via INTRAVENOUS
  Filled 2020-06-30: qty 5

## 2020-06-30 NOTE — Progress Notes (Signed)
Hematology and Oncology Follow Up Visit  Melanie [name] [id number] 1935/01/04 86 y.o. 06/30/2020   Principle Diagnosis:  Metastatic colon cancer -- progressive mets -- NO actionable mutation/LOW TMB/ MSI stable/ KRAS (+)/HER2---progressive  Past Therapy: FOLFOX q 14 days s/p cycle 3 - DC'd secondary to non-tolerance RFA surgery - January 2019 Xeloda 1500 mg po BID (14/7) -- s/p c#4- start on 11/20/2018 -- d/c on 02/28/2019 Avastin 7.5 mg/kg IV q 3 week -- S/p c#4 -started on 06//23/2020 -- d/c on 02/28/2019 Stivarga 80 mg po q day (14 on/7 off) -- start on 04/24/2019-- d/c on 07/10/2019 due to progression FOLFIRI -- started02/24/2021, s/p cycle #4 -- d/c on 10/23/2019 Lonsurf 60 mg po bid (2 week on/ 2 weeks off) -- start in 11/2019--DC on 04/27/2020 secondary to progressive disease  Current Therapy: Comfort care  Transfusion support andIV Ironas indicated   Interim History:  Melanie [name] is here today with her sister for follow-up. She is feeling weak and fatigued. She notes SOB with any exertion and dry cough. She has swelling in her arms and legs with 3+ pitting edema in the legs. Pedal pulses are 2+.  Hgb is 8.6, WBC count 31, platelets 275. Blood glucose 119, alk phos 232, Albumin 1.9, bilirubin 1.0.  She does not have an appetite but is trying to hydrate. Unable to stand for weight today.  No fever, chills, n/v, rash, dizziness, chest pain, palpitations, abdominal pain or changes in bowel or bladder habits.  She is still having loose stool.  No blood loss noted. No bruising or petechiae.  She denies having any falls or syncope. She really does not have the energy to wlk around.   ECOG Performance Status: 3 - Symptomatic, >50% confined to bed  Medications:  Allergies as of 06/30/2020   No Known Allergies     Medication List       Accurate as of June 30, 2020 12:27 PM. If you have any questions, ask your nurse or doctor.         amlodipine-atorvastatin 10-10 MG tablet Commonly known as: CADUET Take 1 tablet by mouth daily.   CALTRATE 600+D PO Take 1 tablet by mouth at bedtime.   diphenoxylate-atropine 2.5-0.025 MG tablet Commonly known as: LOMOTIL Take 2 tablets by mouth 4 (four) times daily as needed for diarrhea or loose stools.   dronabinol 2.5 MG capsule Commonly known as: MARINOL Take 1 capsule (2.5 mg total) by mouth 2 (two) times daily before a meal.   HYDROcodone-homatropine 5-1.5 MG/5ML syrup Commonly known as: Hycodan Take 5 mLs by mouth every 6 (six) hours as needed for cough.   levothyroxine 75 MCG tablet Commonly known as: SYNTHROID TAKE 1 TABLET BY MOUTH DAILY BEFORE BREAKFAST.   lidocaine-prilocaine cream Commonly known as: EMLA APPLY TO AFFECTED AREA AS DIRECTED AS NEEDED   loperamide 2 MG capsule Commonly known as: IMODIUM TAKE 2 TABLETS (4 MG TOTAL) BY MOUTH 3 (THREE) TIMES DAILY AS NEEDED. TAKE 2 AT DIARRHEA ONSET , THEN 1 EVERY 2HR UNTIL 12HRS WITH NO BM. MAY TAKE 2 EVERY 4HRS AT NIGHT. IF DIARRHEA RECURS REPEAT.   metoprolol tartrate 25 MG tablet Commonly known as: LOPRESSOR Take 50 mg by mouth 2 (two) times daily.   morphine CONCENTRATE 10 mg / 0.5 ml concentrated solution Take 0.25-0.5 mLs (5-10 mg total) by mouth every 2 (two) hours as needed for moderate pain or severe pain.   ondansetron 4 MG tablet Commonly known as: Zofran Take 1 tablet (4 mg  total) by mouth every 8 (eight) hours as needed for nausea or vomiting.   VITAMIN B-12 PO Take 1 tablet by mouth daily.       Allergies: No Known Allergies  Past Medical History, Surgical history, Social history, and Family History were reviewed and updated.  Review of Systems: All other 10 point review of systems is negative.   Physical Exam:  height is 5' 3"  (1.6 m). Her oral temperature is 98.4 F (36.9 C). Her blood pressure is 93/55 (abnormal) and her pulse is 88. Her respiration is 22 (abnormal) and oxygen  saturation is 84% (abnormal).   Wt Readings from Last 3 Encounters:  06/09/20 168 lb 1.9 oz (76.3 kg)  05/12/20 169 lb 1.9 oz (76.7 kg)  04/27/20 173 lb (78.5 kg)    Ocular: Sclerae unicteric, pupils equal, round and reactive to light Ear-nose-throat: Oropharynx clear, dentition fair Lymphatic: No cervical or supraclavicular adenopathy Lungs no rales or rhonchi, good excursion bilaterally Heart regular rate and rhythm, no murmur appreciated Abd soft, nontender, positive bowel sounds MSK no focal spinal tenderness, no joint edema Neuro: non-focal, well-oriented, appropriate affect Breasts: Deferred   Lab Results  Component Value Date   WBC 31.3 (H) 06/30/2020   HGB 8.6 (L) 06/30/2020   HCT 25.0 (L) 06/30/2020   MCV 86.8 06/30/2020   PLT 275 06/30/2020   Lab Results  Component Value Date   FERRITIN 1,126 (H) 06/09/2020   IRON 16 (L) 06/09/2020   TIBC 54 (L) 06/09/2020   UIBC 38 (L) 06/09/2020   IRONPCTSAT 29 06/09/2020   Lab Results  Component Value Date   RETICCTPCT 1.7 04/08/2020   RBC 2.88 (L) 06/30/2020   No results found for: KPAFRELGTCHN, LAMBDASER, KAPLAMBRATIO No results found for: IGGSERUM, IGA, IGMSERUM No results found for: Kathrynn Ducking, MSPIKE, SPEI   Chemistry      Component Value Date/Time   NA 139 06/30/2020 1147   NA 144 04/26/2017 0830   NA 137 09/30/2016 0940   K 3.5 06/30/2020 1147   K 4.1 04/26/2017 0830   K 4.7 09/30/2016 0940   CL 107 06/30/2020 1147   CL 108 04/26/2017 0830   CO2 22 06/30/2020 1147   CO2 27 04/26/2017 0830   CO2 18 (L) 09/30/2016 0940   BUN 22 06/30/2020 1147   BUN 25 (H) 04/26/2017 0830   BUN 21.5 09/30/2016 0940   CREATININE 1.26 (H) 06/30/2020 1147   CREATININE 1.5 (H) 04/26/2017 0830   CREATININE 1.8 (H) 09/30/2016 0940      Component Value Date/Time   CALCIUM 9.0 06/30/2020 1147   CALCIUM 9.9 04/26/2017 0830   CALCIUM 10.4 09/30/2016 0940   ALKPHOS 232 (H)  06/30/2020 1147   ALKPHOS 124 (H) 04/26/2017 0830   ALKPHOS 147 09/30/2016 0940   AST 10 (L) 06/30/2020 1147   AST 27 09/30/2016 0940   ALT 3 06/30/2020 1147   ALT 16 04/26/2017 0830   ALT 11 09/30/2016 0940   BILITOT 1.0 06/30/2020 1147   BILITOT 0.34 09/30/2016 0940       Impression and Plan: Melanie [name] is a very pleasant 86yo African American female withmetastaticadenocarcinoma of the descending colon.Sadly her diseasehas progressedand we are now focusing on comfort care.  She is now living with her sister and has agreed to have hospice come in. [facility] has been notified and will come see the patient on Thursday per her request.  We will give her 2 units of blood tomorrow.  TSH is 11. We will increase her Synthroid to 100 mcg daily.  Per MD no follow up required now that patient is being followed by hospice.  We are here for any questions or concerns. They are very appreciative. No other needs expressed at this time.   Laverna Peace, NP 2/1/202212:27 PM

## 2020-06-30 NOTE — Patient Instructions (Signed)
Tunneled Central Venous Catheter Flushing Guide  It is important to flush your tunneled central venous catheter each time you use it, both before and after you use it. Flushing your catheter will help prevent it from clogging. What are the risks? Risks may include:  Infection.  Air getting into the catheter and bloodstream. Supplies needed:  A clean pair of gloves.  A disinfecting wipe. Use an alcohol wipe, chlorhexidine wipe, or iodine wipe as told by your health care provider.  A 10 mL syringe that has been prefilled with saline solution.  An empty 10 mL syringe, if a substance called heparin was injected into your catheter. How to flush your catheter When you flush your catheter, make sure you follow any specific instructions from your health care provider or the manufacturer. These are general guidelines. Flushing your catheter before use If there is heparin in your catheter: 1. Wash your hands with soap and water. 2. Put on gloves. 3. Scrub the injection cap for a minimum of 15 seconds with a disinfecting wipe. 4. Unclamp the catheter. 5. Attach the empty syringe to the injection cap. 6. Pull the syringe plunger back and withdraw 10 mL of blood. 7. Place the syringe into an appropriate waste container. 8. Scrub the injection cap for 15 seconds with a disinfecting wipe. 9. Attach the prefilled syringe to the injection cap. 10. Flush the catheter by pushing the plunger forward until all the liquid from the syringe is in the catheter. 11. Remove the syringe from the injection cap. 12. Clamp the catheter. If there is no heparin in your catheter: 1. Wash your hands with soap and water. 2. Put on gloves. 3. Scrub the injection cap for 15 seconds with a disinfecting wipe. 4. Unclamp the catheter. 5. Attach the prefilled syringe to the injection cap. 6. Flush the catheter by pushing the plunger forward until 5 mL of the liquid from the syringe is in the catheter. 7. Pull back on  the syringe until you see blood in the catheter. 8. If you have been asked to collect any blood, follow your health care provider's instructions. Otherwise, flush the catheter with the rest of the solution from the syringe. 9. Remove the syringe from the injection cap. 10. Clamp the catheter.   Flushing your catheter after use 1. Wash your hands with soap and water. 2. Put on gloves. 3. Scrub the injection cap for 15 seconds with a disinfecting wipe. 4. Unclamp the catheter. 5. Attach the prefilled syringe to the injection cap. 6. Flush the catheter by pushing the plunger forward until all of the liquid from the syringe is in the catheter. 7. Remove the syringe from the injection cap. 8. Clamp the catheter. Problems and solutions  If blood cannot be completely cleared from the injection cap, you may need to have the injection cap replaced.  If the catheter is difficult to flush, use the pulsing method. The pulsing method involves pushing only a few milliliters of solution into the catheter at a time and pausing between pushes.  If you do not see blood in the catheter when you pull back on the syringe, change your body position, such as by raising your arms above your head. Take a deep breath and cough. Then, pull back on the syringe. If you still do not see blood, flush the catheter with a small amount of solution. Then, change positions again and take a breath or cough. Pull back on the syringe again. If you still do not   see blood, finish flushing the catheter and contact your health care provider. Do not use your catheter until your health care provider says it is okay. General tips  Have someone help you flush your catheter, if possible.  Do not force fluid through your catheter.  Do not use a syringe that is larger or smaller than 10 mL. Using a smaller syringe can make the catheter burst.  Do not use your catheter without flushing it first if it has heparin in it. Contact a health  care provider if:  You cannot see any blood in the catheter when you flush it before using it.  Your catheter is difficult to flush. Get help right away if:  You cannot flush the catheter.  The catheter leaks when you flush it or when there is fluid in it.  There are cracks or breaks in the catheter. Summary  It is important to flush your tunneled central venous catheter each time you use it, both before and after you use it.  Scrub the injection cap for 15 seconds with a disinfecting wipe before and after you flush it.  When you flush your catheter, make sure you follow any specific instructions from your health care provider or the manufacturer.  Get help right away if you cannot flush the catheter. This information is not intended to replace advice given to you by your health care provider. Make sure you discuss any questions you have with your health care provider. Document Revised: 07/25/2019 Document Reviewed: 08/01/2018 Elsevier Patient Education  2021 Elsevier Inc.  

## 2020-07-01 ENCOUNTER — Telehealth: Payer: Self-pay

## 2020-07-01 ENCOUNTER — Inpatient Hospital Stay: Payer: Medicare Other

## 2020-07-01 DIAGNOSIS — C186 Malignant neoplasm of descending colon: Secondary | ICD-10-CM | POA: Diagnosis not present

## 2020-07-01 DIAGNOSIS — C787 Secondary malignant neoplasm of liver and intrahepatic bile duct: Secondary | ICD-10-CM

## 2020-07-01 DIAGNOSIS — D649 Anemia, unspecified: Secondary | ICD-10-CM

## 2020-07-01 DIAGNOSIS — C189 Malignant neoplasm of colon, unspecified: Secondary | ICD-10-CM

## 2020-07-01 LAB — IRON AND TIBC
Iron: 16 ug/dL — ABNORMAL LOW (ref 41–142)
Saturation Ratios: 37 % (ref 21–57)
TIBC: 43 ug/dL — ABNORMAL LOW (ref 236–444)
UIBC: 27 ug/dL — ABNORMAL LOW (ref 120–384)

## 2020-07-01 LAB — TSH: TSH: 11.062 u[IU]/mL — ABNORMAL HIGH (ref 0.308–3.960)

## 2020-07-01 LAB — FERRITIN: Ferritin: 2341 ng/mL — ABNORMAL HIGH (ref 11–307)

## 2020-07-01 LAB — CEA (IN HOUSE-CHCC): CEA (CHCC-In House): 254.63 ng/mL — ABNORMAL HIGH (ref 0.00–5.00)

## 2020-07-01 MED ORDER — ACETAMINOPHEN 325 MG PO TABS
ORAL_TABLET | ORAL | Status: AC
  Filled 2020-07-01: qty 2

## 2020-07-01 MED ORDER — LEVOTHYROXINE SODIUM 100 MCG PO TABS: 100.0000 ug | ORAL_TABLET | Freq: Every day | ORAL | 0 refills | Status: AC

## 2020-07-01 MED ORDER — DIPHENHYDRAMINE HCL 25 MG PO CAPS
25.0000 mg | ORAL_CAPSULE | Freq: Once | ORAL | Status: AC
  Administered 2020-07-01: 25 mg via ORAL

## 2020-07-01 MED ORDER — ACETAMINOPHEN 325 MG PO TABS
650.0000 mg | ORAL_TABLET | Freq: Once | ORAL | Status: AC
  Administered 2020-07-01: 650 mg via ORAL

## 2020-07-01 MED ORDER — SODIUM CHLORIDE 0.9% FLUSH
3.0000 mL | INTRAVENOUS | Status: AC | PRN
  Administered 2020-07-01: 3 mL
  Filled 2020-07-01: qty 10

## 2020-07-01 MED ORDER — SODIUM CHLORIDE 0.9% IV SOLUTION
250.0000 mL | Freq: Once | INTRAVENOUS | Status: DC
  Filled 2020-07-01: qty 250

## 2020-07-01 MED ORDER — HEPARIN SOD (PORK) LOCK FLUSH 100 UNIT/ML IV SOLN
250.0000 [IU] | INTRAVENOUS | Status: AC | PRN
  Administered 2020-07-01: 250 [IU]
  Filled 2020-07-01: qty 5

## 2020-07-01 MED ORDER — DIPHENHYDRAMINE HCL 25 MG PO CAPS
ORAL_CAPSULE | ORAL | Status: AC
  Filled 2020-07-01: qty 1

## 2020-07-01 NOTE — Telephone Encounter (Signed)
-----   Message from Eliezer Bottom, NP sent at 07/01/2020  9:53 AM EST ----- Can you let Roniyah's sister know that her TSH was 11 so we have increased her Synthroid to 100 mcg PO daily. Thank you!   ----- Message ----- From: Interface, Lab In [place] Sent: 06/30/2020  11:55 AM EST To: Eliezer Bottom, NP

## 2020-07-01 NOTE — Telephone Encounter (Signed)
Patient in for blood product today, informed patient and patients sister in lobby of TSH levels and increase in medication. Informed her a new prescription was sent to her pharmacy. Patient and patients sister verbalized understanding and denies any questions or concerns at this time.

## 2020-07-01 NOTE — Telephone Encounter (Signed)
No 06/30/20 LOS   Melanie [name]

## 2020-07-01 NOTE — Patient Instructions (Signed)
Blood Transfusion, Adult °A blood transfusion is a procedure in which you receive blood through an IV tube. You may need this procedure because of: °A bleeding disorder. °An illness. °An injury. °A surgery. °The blood may come from someone else (a donor). You may also be able to donate blood for yourself. The blood given in a transfusion is made up of different types of cells. You may get: °Red blood cells. These carry oxygen to the cells in the body. °White blood cells. These help you fight infections. °Platelets. These help your blood to clot. °Plasma. This is the liquid part of your blood. It carries proteins and other substances through the body. °If you have a clotting disorder, you may also get other types of blood products. °Tell your doctor about: °Any blood disorders you have. °Any reactions you have had during a blood transfusion in the past. °Any allergies you have. °All medicines you are taking, including vitamins, herbs, eye drops, creams, and over-the-counter medicines. °Any surgeries you have had. °Any medical conditions you have. This includes any recent fever or cold symptoms. °Whether you are pregnant or may be pregnant. °What are the risks? °Generally, this is a safe procedure. However, problems may occur. °The most common problems include: °A mild allergic reaction. This includes red, swollen areas of skin (hives) and itching. °Fever or chills. This may be the body's response to new blood cells received. This may happen during or up to 4 hours after the transfusion. °More serious problems may include: °Too much fluid in the lungs. This may cause breathing problems. °A serious allergic reaction. This includes breathing trouble or swelling around the face and lips. °Lung injury. This causes breathing trouble and low oxygen in the blood. This can happen within hours of the transfusion or days later. °Too much iron. This can happen after getting many blood transfusions over a period of time. °An  infection or virus passed through the blood. This is rare. Donated blood is carefully tested before it is given. °Your body's defense system (immune system) trying to attack the new blood cells. This is rare. Symptoms may include fever, chills, nausea, low blood pressure, and low back or chest pain. °Donated cells attacking healthy tissues. This is rare. °What happens before the procedure? °Medicines °Ask your doctor about: °Changing or stopping your normal medicines. This is important. °Taking aspirin and ibuprofen. Do not take these medicines unless your doctor tells you to take them. °Taking over-the-counter medicines, vitamins, herbs, and supplements. °General instructions °Follow instructions from your doctor about what you cannot eat or drink. °You will have a blood test to find out your blood type. The test also finds out what type of blood your body will accept and matches it to the donor type. °If you are going to have a planned surgery, you may be able to donate your own blood. This may be done in case you need a transfusion. °You will have your temperature, blood pressure, and pulse checked. °You may receive medicine to help prevent an allergic reaction. This may be done if you have had a reaction to a transfusion before. This medicine may be given to you by mouth or through an IV tube. °This procedure lasts about 1-4 hours. Plan for the time you need. °What happens during the procedure? °An IV tube will be put into one of your veins. °The bag of donated blood will be attached to your IV tube. Then, the blood will enter through your vein. °Your temperature, blood   pressure, and pulse will be checked often. This is done to find early signs of a transfusion reaction. °Tell your nurse right away if you have any of these symptoms: °Shortness of breath or trouble breathing. °Chest or back pain. °Fever or chills. °Red, swollen areas of skin or itching. °If you have any signs or symptoms of a reaction, your  transfusion will be stopped. You may also be given medicine. °When the transfusion is finished, your IV tube will be taken out. °Pressure may be put on the IV site for a few minutes. °A bandage (dressing) will be put on the IV site. °The procedure may vary among doctors and hospitals.   °What happens after the procedure? °You will be monitored until you leave the hospital or clinic. This includes checking your temperature, blood pressure, pulse, breathing rate, and blood oxygen level. °Your blood may be tested to see how you are responding to the transfusion. °You may be warmed with fluids or blankets. This is done to keep the temperature of your body normal. °If you have your procedure in an outpatient setting, you will be told whom to contact to report any reactions. °Where to find more information °To learn more, visit the [employer]: redcross.org °Summary °A blood transfusion is a procedure in which you are given blood through an IV tube. °The blood may come from someone else (a donor). You may also be able to donate blood for yourself. °The blood you are given is made up of different blood cells. You may receive red blood cells, platelets, plasma, or white blood cells. °Your temperature, blood pressure, and pulse will be checked often. °After the procedure, your blood may be tested to see how you are responding. °This information is not intended to replace advice given to you by your health care provider. Make sure you discuss any questions you have with your health care provider. °Document Revised: 11/08/2018 Document Reviewed: 11/08/2018 °Elsevier Patient Education © 2021 Elsevier Inc. ° °

## 2020-07-02 LAB — TYPE AND SCREEN
ABO/RH(D): O POS
Antibody Screen: NEGATIVE
Unit division: 0
Unit division: 0

## 2020-07-02 LAB — BPAM RBC
Blood Product Expiration Date: 202202252359
Blood Product Expiration Date: 202202252359
ISSUE DATE / TIME: 202202020750
ISSUE DATE / TIME: 202202020750
Unit Type and Rh: 5100
Unit Type and Rh: 5100

## 2020-07-05 ENCOUNTER — Encounter: Payer: Self-pay | Admitting: Hematology & Oncology

## 2020-07-06 ENCOUNTER — Telehealth: Payer: Self-pay | Admitting: *Deleted

## 2020-07-06 NOTE — Telephone Encounter (Signed)
Received fax documentation from [facility] that patient had passed away 07-21-20 at 1105am.  Also received MyChart message from patients sister letting us know that her sister had passed away.  Dr Marin Olp notified. [facility] ordered

## 2020-07-08 ENCOUNTER — Other Ambulatory Visit: Payer: Self-pay | Admitting: Hematology & Oncology

## 2020-07-28 DEATH — deceased

## 2021-09-17 NOTE — Telephone Encounter (Signed)
Na
